# Patient Record
Sex: Male | Born: 1955 | Race: Black or African American | Hispanic: No | Smoking: Never smoker
Health system: Southern US, Community
[De-identification: ages and names within clinical notes are randomized; demographics above are authoritative.]

## PROBLEM LIST (undated history)

## (undated) DIAGNOSIS — I10 Essential (primary) hypertension: Secondary | ICD-10-CM

## (undated) DIAGNOSIS — K219 Gastro-esophageal reflux disease without esophagitis: Secondary | ICD-10-CM

## (undated) DIAGNOSIS — E119 Type 2 diabetes mellitus without complications: Secondary | ICD-10-CM

---

## 2018-07-09 NOTE — Plan of Care (Signed)
Transfer from [facility]. Jerry Green is a 62 year old male with pmh HTN, DM type II, and GERD; who presented with complaints of 3 days of abdominal pain.  Heart rates 104, and all other vital signs stable.  Labs revealed WBC 12.9 and Hbg 12.  Lactic acid and LFTs reportedly within normal limits.  CT abdomen pelvis revealed perforated post obstruction lesion near the splenic flexure with carcinomatosis and liver mets. No previous history of malignancy known. General surgery was consulted, but recommended coordination of care with GI which was not available at their facility.  Dr. Windle Guard of general surgery consulted here at [facility] who agreed to see in consultation.  Patient was started on antibiotics Zosyn and ciprofloxacin.  TRH called to admit.  Accepted to a stepdown bed due to possible concern for decompensation.

## 2018-07-10 ENCOUNTER — Other Ambulatory Visit: Payer: Self-pay

## 2018-07-10 ENCOUNTER — Encounter (HOSPITAL_COMMUNITY): Payer: Self-pay | Admitting: Internal Medicine

## 2018-07-10 ENCOUNTER — Inpatient Hospital Stay (HOSPITAL_COMMUNITY): Payer: Medicaid - Out of State

## 2018-07-10 ENCOUNTER — Inpatient Hospital Stay (HOSPITAL_COMMUNITY)
Admission: AD | Admit: 2018-07-10 | Discharge: 2018-09-02 | DRG: 329 | Disposition: E | Payer: Medicaid - Out of State | Source: Other Acute Inpatient Hospital | Attending: Internal Medicine | Admitting: Internal Medicine

## 2018-07-10 DIAGNOSIS — K219 Gastro-esophageal reflux disease without esophagitis: Secondary | ICD-10-CM | POA: Diagnosis not present

## 2018-07-10 DIAGNOSIS — J80 Acute respiratory distress syndrome: Secondary | ICD-10-CM | POA: Diagnosis not present

## 2018-07-10 DIAGNOSIS — C183 Malignant neoplasm of hepatic flexure: Secondary | ICD-10-CM | POA: Diagnosis not present

## 2018-07-10 DIAGNOSIS — K560 Paralytic ileus: Secondary | ICD-10-CM | POA: Diagnosis not present

## 2018-07-10 DIAGNOSIS — J189 Pneumonia, unspecified organism: Secondary | ICD-10-CM | POA: Diagnosis not present

## 2018-07-10 DIAGNOSIS — R509 Fever, unspecified: Secondary | ICD-10-CM | POA: Diagnosis not present

## 2018-07-10 DIAGNOSIS — C786 Secondary malignant neoplasm of retroperitoneum and peritoneum: Secondary | ICD-10-CM | POA: Diagnosis not present

## 2018-07-10 DIAGNOSIS — D62 Acute posthemorrhagic anemia: Secondary | ICD-10-CM | POA: Diagnosis not present

## 2018-07-10 DIAGNOSIS — E440 Moderate protein-calorie malnutrition: Secondary | ICD-10-CM | POA: Diagnosis not present

## 2018-07-10 DIAGNOSIS — R0682 Tachypnea, not elsewhere classified: Secondary | ICD-10-CM

## 2018-07-10 DIAGNOSIS — C772 Secondary and unspecified malignant neoplasm of intra-abdominal lymph nodes: Secondary | ICD-10-CM | POA: Diagnosis not present

## 2018-07-10 DIAGNOSIS — Y838 Other surgical procedures as the cause of abnormal reaction of the patient, or of later complication, without mention of misadventure at the time of the procedure: Secondary | ICD-10-CM | POA: Diagnosis not present

## 2018-07-10 DIAGNOSIS — J9690 Respiratory failure, unspecified, unspecified whether with hypoxia or hypercapnia: Secondary | ICD-10-CM

## 2018-07-10 DIAGNOSIS — K750 Abscess of liver: Secondary | ICD-10-CM | POA: Diagnosis not present

## 2018-07-10 DIAGNOSIS — F05 Delirium due to known physiological condition: Secondary | ICD-10-CM | POA: Diagnosis not present

## 2018-07-10 DIAGNOSIS — N179 Acute kidney failure, unspecified: Secondary | ICD-10-CM | POA: Diagnosis not present

## 2018-07-10 DIAGNOSIS — Z7189 Other specified counseling: Secondary | ICD-10-CM | POA: Diagnosis not present

## 2018-07-10 DIAGNOSIS — R188 Other ascites: Secondary | ICD-10-CM

## 2018-07-10 DIAGNOSIS — D630 Anemia in neoplastic disease: Secondary | ICD-10-CM | POA: Diagnosis present

## 2018-07-10 DIAGNOSIS — J90 Pleural effusion, not elsewhere classified: Secondary | ICD-10-CM

## 2018-07-10 DIAGNOSIS — D649 Anemia, unspecified: Secondary | ICD-10-CM | POA: Diagnosis not present

## 2018-07-10 DIAGNOSIS — K651 Peritoneal abscess: Secondary | ICD-10-CM | POA: Diagnosis not present

## 2018-07-10 DIAGNOSIS — K56609 Unspecified intestinal obstruction, unspecified as to partial versus complete obstruction: Secondary | ICD-10-CM | POA: Diagnosis present

## 2018-07-10 DIAGNOSIS — K9189 Other postprocedural complications and disorders of digestive system: Secondary | ICD-10-CM | POA: Diagnosis not present

## 2018-07-10 DIAGNOSIS — C778 Secondary and unspecified malignant neoplasm of lymph nodes of multiple regions: Secondary | ICD-10-CM | POA: Diagnosis not present

## 2018-07-10 DIAGNOSIS — E870 Hyperosmolality and hypernatremia: Secondary | ICD-10-CM | POA: Diagnosis not present

## 2018-07-10 DIAGNOSIS — Z933 Colostomy status: Secondary | ICD-10-CM

## 2018-07-10 DIAGNOSIS — N19 Unspecified kidney failure: Secondary | ICD-10-CM | POA: Diagnosis not present

## 2018-07-10 DIAGNOSIS — Z781 Physical restraint status: Secondary | ICD-10-CM

## 2018-07-10 DIAGNOSIS — Z7984 Long term (current) use of oral hypoglycemic drugs: Secondary | ICD-10-CM

## 2018-07-10 DIAGNOSIS — I10 Essential (primary) hypertension: Secondary | ICD-10-CM | POA: Diagnosis not present

## 2018-07-10 DIAGNOSIS — E1165 Type 2 diabetes mellitus with hyperglycemia: Secondary | ICD-10-CM | POA: Diagnosis not present

## 2018-07-10 DIAGNOSIS — C186 Malignant neoplasm of descending colon: Secondary | ICD-10-CM | POA: Diagnosis not present

## 2018-07-10 DIAGNOSIS — C189 Malignant neoplasm of colon, unspecified: Secondary | ICD-10-CM | POA: Diagnosis not present

## 2018-07-10 DIAGNOSIS — Z66 Do not resuscitate: Secondary | ICD-10-CM

## 2018-07-10 DIAGNOSIS — Z978 Presence of other specified devices: Secondary | ICD-10-CM | POA: Diagnosis not present

## 2018-07-10 DIAGNOSIS — C185 Malignant neoplasm of splenic flexure: Secondary | ICD-10-CM | POA: Diagnosis not present

## 2018-07-10 DIAGNOSIS — D473 Essential (hemorrhagic) thrombocythemia: Secondary | ICD-10-CM | POA: Diagnosis not present

## 2018-07-10 DIAGNOSIS — K631 Perforation of intestine (nontraumatic): Secondary | ICD-10-CM | POA: Diagnosis present

## 2018-07-10 DIAGNOSIS — A419 Sepsis, unspecified organism: Secondary | ICD-10-CM | POA: Diagnosis not present

## 2018-07-10 DIAGNOSIS — Z4659 Encounter for fitting and adjustment of other gastrointestinal appliance and device: Secondary | ICD-10-CM

## 2018-07-10 DIAGNOSIS — D72829 Elevated white blood cell count, unspecified: Secondary | ICD-10-CM | POA: Diagnosis not present

## 2018-07-10 DIAGNOSIS — R918 Other nonspecific abnormal finding of lung field: Secondary | ICD-10-CM | POA: Diagnosis not present

## 2018-07-10 DIAGNOSIS — I952 Hypotension due to drugs: Secondary | ICD-10-CM | POA: Diagnosis not present

## 2018-07-10 DIAGNOSIS — J9601 Acute respiratory failure with hypoxia: Secondary | ICD-10-CM | POA: Diagnosis not present

## 2018-07-10 DIAGNOSIS — K760 Fatty (change of) liver, not elsewhere classified: Secondary | ICD-10-CM | POA: Diagnosis present

## 2018-07-10 DIAGNOSIS — J95821 Acute postprocedural respiratory failure: Secondary | ICD-10-CM | POA: Diagnosis not present

## 2018-07-10 DIAGNOSIS — J9622 Acute and chronic respiratory failure with hypercapnia: Secondary | ICD-10-CM | POA: Diagnosis not present

## 2018-07-10 DIAGNOSIS — E878 Other disorders of electrolyte and fluid balance, not elsewhere classified: Secondary | ICD-10-CM | POA: Diagnosis not present

## 2018-07-10 DIAGNOSIS — Y92230 Patient room in hospital as the place of occurrence of the external cause: Secondary | ICD-10-CM | POA: Diagnosis not present

## 2018-07-10 DIAGNOSIS — E876 Hypokalemia: Secondary | ICD-10-CM | POA: Diagnosis not present

## 2018-07-10 DIAGNOSIS — Z79899 Other long term (current) drug therapy: Secondary | ICD-10-CM

## 2018-07-10 DIAGNOSIS — Z515 Encounter for palliative care: Secondary | ICD-10-CM | POA: Diagnosis not present

## 2018-07-10 DIAGNOSIS — K567 Ileus, unspecified: Secondary | ICD-10-CM | POA: Diagnosis not present

## 2018-07-10 DIAGNOSIS — Z886 Allergy status to analgesic agent status: Secondary | ICD-10-CM

## 2018-07-10 DIAGNOSIS — Z6841 Body Mass Index (BMI) 40.0 and over, adult: Secondary | ICD-10-CM

## 2018-07-10 DIAGNOSIS — K6389 Other specified diseases of intestine: Secondary | ICD-10-CM | POA: Diagnosis not present

## 2018-07-10 DIAGNOSIS — C801 Malignant (primary) neoplasm, unspecified: Secondary | ICD-10-CM | POA: Diagnosis not present

## 2018-07-10 DIAGNOSIS — E785 Hyperlipidemia, unspecified: Secondary | ICD-10-CM | POA: Diagnosis present

## 2018-07-10 DIAGNOSIS — T380X5A Adverse effect of glucocorticoids and synthetic analogues, initial encounter: Secondary | ICD-10-CM | POA: Diagnosis not present

## 2018-07-10 DIAGNOSIS — E119 Type 2 diabetes mellitus without complications: Secondary | ICD-10-CM | POA: Diagnosis not present

## 2018-07-10 DIAGNOSIS — J9600 Acute respiratory failure, unspecified whether with hypoxia or hypercapnia: Secondary | ICD-10-CM | POA: Diagnosis not present

## 2018-07-10 DIAGNOSIS — G9341 Metabolic encephalopathy: Secondary | ICD-10-CM | POA: Diagnosis not present

## 2018-07-10 DIAGNOSIS — Z9289 Personal history of other medical treatment: Secondary | ICD-10-CM

## 2018-07-10 DIAGNOSIS — Z833 Family history of diabetes mellitus: Secondary | ICD-10-CM

## 2018-07-10 DIAGNOSIS — I471 Supraventricular tachycardia: Secondary | ICD-10-CM | POA: Diagnosis not present

## 2018-07-10 DIAGNOSIS — Z86010 Personal history of colonic polyps: Secondary | ICD-10-CM

## 2018-07-10 DIAGNOSIS — K56699 Other intestinal obstruction unspecified as to partial versus complete obstruction: Secondary | ICD-10-CM | POA: Diagnosis not present

## 2018-07-10 DIAGNOSIS — T8132XA Disruption of internal operation (surgical) wound, not elsewhere classified, initial encounter: Secondary | ICD-10-CM | POA: Diagnosis not present

## 2018-07-10 DIAGNOSIS — J9621 Acute and chronic respiratory failure with hypoxia: Secondary | ICD-10-CM | POA: Diagnosis not present

## 2018-07-10 DIAGNOSIS — C787 Secondary malignant neoplasm of liver and intrahepatic bile duct: Secondary | ICD-10-CM | POA: Diagnosis not present

## 2018-07-10 DIAGNOSIS — T426X5A Adverse effect of other antiepileptic and sedative-hypnotic drugs, initial encounter: Secondary | ICD-10-CM | POA: Diagnosis not present

## 2018-07-10 DIAGNOSIS — E872 Acidosis: Secondary | ICD-10-CM | POA: Diagnosis not present

## 2018-07-10 DIAGNOSIS — M109 Gout, unspecified: Secondary | ICD-10-CM | POA: Diagnosis present

## 2018-07-10 DIAGNOSIS — C800 Disseminated malignant neoplasm, unspecified: Secondary | ICD-10-CM | POA: Diagnosis not present

## 2018-07-10 DIAGNOSIS — E874 Mixed disorder of acid-base balance: Secondary | ICD-10-CM | POA: Diagnosis not present

## 2018-07-10 DIAGNOSIS — C182 Malignant neoplasm of ascending colon: Secondary | ICD-10-CM | POA: Diagnosis not present

## 2018-07-10 DIAGNOSIS — C799 Secondary malignant neoplasm of unspecified site: Secondary | ICD-10-CM

## 2018-07-10 DIAGNOSIS — K659 Peritonitis, unspecified: Secondary | ICD-10-CM | POA: Diagnosis not present

## 2018-07-10 HISTORY — DX: Gastro-esophageal reflux disease without esophagitis: K21.9

## 2018-07-10 HISTORY — DX: Essential (primary) hypertension: I10

## 2018-07-10 HISTORY — DX: Type 2 diabetes mellitus without complications: E11.9

## 2018-07-10 LAB — GLUCOSE, CAPILLARY
GLUCOSE-CAPILLARY: 119 mg/dL — AB (ref 70–99)
GLUCOSE-CAPILLARY: 101 mg/dL — AB (ref 70–99)
GLUCOSE-CAPILLARY: 99 mg/dL (ref 70–99)
GLUCOSE-CAPILLARY: 119 mg/dL — AB (ref 70–99)

## 2018-07-10 LAB — CBC
HEMATOCRIT: 36.4 % — AB (ref 39.0–52.0)
Hemoglobin: 11.8 g/dL — ABNORMAL LOW (ref 13.0–17.0)
MCH: 28.8 pg (ref 26.0–34.0)
MCHC: 32.4 g/dL (ref 30.0–36.0)
MCV: 88.8 fL (ref 80.0–100.0)
PLATELETS: 459 10*3/uL — AB (ref 150–400)
RBC: 4.1 MIL/uL — ABNORMAL LOW (ref 4.22–5.81)
RDW: 13.2 % (ref 11.5–15.5)
WBC: 11.1 10*3/uL — AB (ref 4.0–10.5)

## 2018-07-10 LAB — COMPREHENSIVE METABOLIC PANEL
ALBUMIN: 3.2 g/dL — AB (ref 3.5–5.0)
ALT: 15 U/L (ref 0–44)
ANION GAP: 9 (ref 5–15)
AST: 24 U/L (ref 15–41)
Alkaline Phosphatase: 94 U/L (ref 38–126)
BUN: 11 mg/dL (ref 8–23)
CO2: 23 mmol/L (ref 22–32)
Calcium: 9 mg/dL (ref 8.9–10.3)
Chloride: 104 mmol/L (ref 98–111)
Creatinine, Ser: 1.06 mg/dL (ref 0.61–1.24)
GFR calc Af Amer: >60 mL/min (ref >60)
GFR calc non Af Amer: >60 mL/min (ref >60)
GLUCOSE: 136 mg/dL — AB (ref 70–99)
POTASSIUM: 4.2 mmol/L (ref 3.5–5.1)
SODIUM: 136 mmol/L (ref 135–145)
Total Bilirubin: 0.9 mg/dL (ref 0.3–1.2)
Total Protein: 7.8 g/dL (ref 6.5–8.1)

## 2018-07-10 LAB — PROTIME-INR
INR: 1.13
Prothrombin Time: 14.4 seconds (ref 11.4–15.2)

## 2018-07-10 LAB — APTT: aPTT: 33 seconds (ref 24–36)

## 2018-07-10 LAB — TYPE AND SCREEN
ABO/RH(D): O POS
Antibody Screen: NEGATIVE

## 2018-07-10 LAB — HEMOGLOBIN A1C
Hgb A1c MFr Bld: 7.5 % — ABNORMAL HIGH (ref 4.8–5.6)
MEAN PLASMA GLUCOSE: 168.55 mg/dL

## 2018-07-10 LAB — HIV ANTIBODY (ROUTINE TESTING W REFLEX): HIV Screen 4th Generation wRfx: NONREACTIVE

## 2018-07-10 LAB — MRSA PCR SCREENING: MRSA by PCR: NEGATIVE

## 2018-07-10 LAB — ABO/RH: ABO/RH(D): O POS

## 2018-07-10 MED ORDER — FENTANYL CITRATE (PF) 100 MCG/2ML IJ SOLN
25.0000 ug | INTRAMUSCULAR | Status: DC | PRN
  Administered 2018-07-10 (×2): 25 ug via INTRAVENOUS
  Filled 2018-07-10 (×2): qty 2

## 2018-07-10 MED ORDER — SODIUM CHLORIDE 0.9 % IV SOLN
INTRAVENOUS | Status: DC
  Administered 2018-07-10: 06:00:00 via INTRAVENOUS

## 2018-07-10 MED ORDER — MIDAZOLAM HCL 2 MG/2ML IJ SOLN
INTRAMUSCULAR | Status: AC
  Filled 2018-07-10: qty 2

## 2018-07-10 MED ORDER — FAMOTIDINE IN NACL 20-0.9 MG/50ML-% IV SOLN
20.0000 mg | Freq: Two times a day (BID) | INTRAVENOUS | Status: DC
  Administered 2018-07-10 – 2018-07-11 (×3): 20 mg via INTRAVENOUS
  Filled 2018-07-10 (×3): qty 50

## 2018-07-10 MED ORDER — PIPERACILLIN-TAZOBACTAM 3.375 G IVPB 30 MIN
3.3750 g | Freq: Once | INTRAVENOUS | Status: AC
  Administered 2018-07-10: 3.375 g via INTRAVENOUS
  Filled 2018-07-10: qty 50

## 2018-07-10 MED ORDER — PROPOFOL 10 MG/ML IV BOLUS
INTRAVENOUS | Status: AC
  Filled 2018-07-10: qty 20

## 2018-07-10 MED ORDER — INSULIN ASPART 100 UNIT/ML ~~LOC~~ SOLN: 0.0000 [IU] | Freq: Every day | SUBCUTANEOUS | Status: DC

## 2018-07-10 MED ORDER — ACETAMINOPHEN 650 MG RE SUPP: 650.0000 mg | Freq: Four times a day (QID) | RECTAL | Status: DC | PRN

## 2018-07-10 MED ORDER — SODIUM CHLORIDE 0.9 % IV SOLN
2.0000 g | INTRAVENOUS | Status: DC
  Filled 2018-07-10: qty 2

## 2018-07-10 MED ORDER — ONDANSETRON HCL 4 MG PO TABS: 4.0000 mg | ORAL_TABLET | Freq: Four times a day (QID) | ORAL | Status: DC | PRN

## 2018-07-10 MED ORDER — FENTANYL CITRATE (PF) 250 MCG/5ML IJ SOLN
INTRAMUSCULAR | Status: AC
  Filled 2018-07-10: qty 5

## 2018-07-10 MED ORDER — HYDRALAZINE HCL 20 MG/ML IJ SOLN: 10.0000 mg | INTRAMUSCULAR | Status: DC | PRN

## 2018-07-10 MED ORDER — INSULIN ASPART 100 UNIT/ML ~~LOC~~ SOLN: 0.0000 [IU] | Freq: Three times a day (TID) | SUBCUTANEOUS | Status: DC

## 2018-07-10 MED ORDER — ENOXAPARIN SODIUM 40 MG/0.4ML ~~LOC~~ SOLN
40.0000 mg | SUBCUTANEOUS | Status: DC
  Filled 2018-07-10: qty 0.4

## 2018-07-10 MED ORDER — PNEUMOCOCCAL VAC POLYVALENT 25 MCG/0.5ML IJ INJ
0.5000 mL | INJECTION | INTRAMUSCULAR | Status: DC
  Filled 2018-07-10: qty 0.5

## 2018-07-10 MED ORDER — HYDRALAZINE HCL 20 MG/ML IJ SOLN: 10.0000 mg | Freq: Four times a day (QID) | INTRAMUSCULAR | Status: DC | PRN

## 2018-07-10 MED ORDER — ACETAMINOPHEN 325 MG PO TABS: 650.0000 mg | ORAL_TABLET | Freq: Four times a day (QID) | ORAL | Status: DC | PRN

## 2018-07-10 MED ORDER — ONDANSETRON HCL 4 MG/2ML IJ SOLN: 4.0000 mg | Freq: Four times a day (QID) | INTRAMUSCULAR | Status: DC | PRN

## 2018-07-10 MED ORDER — ENOXAPARIN SODIUM 30 MG/0.3ML ~~LOC~~ SOLN
30.0000 mg | SUBCUTANEOUS | Status: DC
  Filled 2018-07-10: qty 0.3

## 2018-07-10 MED ORDER — SODIUM CHLORIDE 0.9% FLUSH
3.0000 mL | Freq: Two times a day (BID) | INTRAVENOUS | Status: DC
  Administered 2018-07-10 – 2018-07-11 (×2): 3 mL via INTRAVENOUS

## 2018-07-10 MED ORDER — PIPERACILLIN-TAZOBACTAM 3.375 G IVPB
3.3750 g | Freq: Three times a day (TID) | INTRAVENOUS | Status: DC
  Administered 2018-07-10 – 2018-07-11 (×3): 3.375 g via INTRAVENOUS
  Filled 2018-07-10 (×3): qty 50

## 2018-07-10 NOTE — Progress Notes (Signed)
Initial Nutrition Assessment  DOCUMENTATION CODES:   Obesity unspecified  INTERVENTION:   - Once diet advanced, recommend Ensure Enlive po BID, each supplement provides 350 kcal and 20 grams of protein  NUTRITION DIAGNOSIS:   Inadequate oral intake related to poor appetite, nausea, vomiting as evidenced by per patient/family report, NPO status.  GOAL:   Patient will meet greater than or equal to 90% of their needs  MONITOR:   Diet advancement, I & O's, Labs, Skin  REASON FOR ASSESSMENT:   Malnutrition Screening Tool    ASSESSMENT:   62 year old male who presented with abdominal pain. PMH significant for hypertension, type 2 diabetes mellitus, GERD. 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  Spoke with pt at bedside who was anxiously awaiting surgery. Pt states that he hopes his family will be able to come visit him. Noted plan for surgery later today or tomorrow: L colectomy with colostomy.  Discussed plan with RN.  Pt states that he has felt sluggish and has had a poor appetite since Thursday of last week. Pt states that he has not had anything to eat since he tried to consume chicken noodle soup on Sunday and subsequently vomited. Pt states that this was the last time he had N/V PTA.  Pt states that he typically eats 3 meals daily and drinks water.  Breakfast: cereal with milk Lunch: "I cook something" Dinner: snack  Pt endorses recent weight loss, stating that his PCP told him that he has lost 10 lbs recently. Pt reports his UBW as 275 lbs. Noted weight of 265 lbs on admission. Unsure of timeframe of weight loss.  Medications reviewed and include: Pepcid 20 mg q 12 hours, IV Zosyn  Labs reviewed: hemoglobin A1C 7.5 (H)  NUTRITION - FOCUSED PHYSICAL EXAM:    Most Recent Value  Orbital Region  No depletion  Upper Arm  Region  No depletion  Thoracic and Lumbar Region  No depletion  Buccal Region  No depletion  Temple Region  No depletion  Clavicle Bone Region  No depletion  Clavicle and Acromion Bone Region  No depletion  Scapular Bone Region  No depletion  Dorsal Hand  No depletion  Patellar Region  No depletion  Anterior Thigh Region  No depletion  Posterior Calf Region  No depletion  Edema (RD Assessment)  Moderate [abdomen]  Hair  Reviewed  Eyes  Reviewed  Mouth  Reviewed  Skin  Reviewed  Nails  Reviewed       Diet Order:   Diet Order            Diet NPO time specified  Diet effective now              EDUCATION NEEDS:   No education needs have been identified at this time  Skin:  Skin Assessment: Reviewed RN Assessment  Last BM:  unknown/PTA  Height:   Ht Readings from Last 1 Encounters:  07/04/2018 6' (1.829 m)    Weight:   Wt Readings from Last 1 Encounters:  07/21/2018 120.4 kg    Ideal Body Weight:  80.91 kg  BMI:  Body mass index is 36 kg/m.  Estimated Nutritional Needs:   Kcal:  2000-2200  Protein:  110-125 grams  Fluid:  2.0-2.2 L    Gaynell Face, MS, RD, LDN Inpatient Clinical Dietitian Pager: [phone] Weekend/After Hours: [phone]

## 2018-07-10 NOTE — Consult Note (Signed)
Surgical Consultation Requesting provider: Dr. 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  Transferred here from [facility] in [place], [place], Dr. Sharyne Peach where he presented at approximately 5 PM yesterday with lower abdominal pain for the preceding 2 days, worsening. Aching/ cramping in quality. Associated with 3 episodes of emesis (nonbloody, nonbilious). Prior to Saturday, he denies any abdominal pain, bloating, loss of appetite, or change in bowel movements. Denies melena or hematochezia. His pain currently is mild, but is worst in the right lower quadrant. Nausea is minimal at this time. He reports his last colonoscopy was in 2014; he believes there were a couple polyps but states he was never given the results of any biopsy.  At [facility] ER: His initial vital signs included a blood pressure 123/88, heart rate of 110, respirations 20, temperature 99.3, saturations 96%, weight 129.27 kg.  His abdominal exam was notable for lower abdominal tenderness without any evidence of peritonitis. Workup included urinalysis, blood cultures, EKG, labs and CT scan. White blood cell count 12.9, hemoglobin 12.7, platelets 520, bicarbonate 23, creatinine 1.1, LFTs and lipase unremarkable, albumin 4.0. Urine specific gravity 1.046 concerning for dehydration.  CT scan with IV contrast findings included hepatic keratosis with several cavitated liver lesions the largest measuring 3.1 cmconsistent with metastases, annular obstructing mass in the wall of the splenic flexure of the colon surrounded by spiculated collection of fluid and air measuring 6.1 x 6.9 cm consistent with colon carcinoma with contained perforation; obstruction with distention of the transverse and ascending colon and the entire small bowel, mass appears to invade the lateral aspect of the mid left kidney; trace free peritoneal fluid and nodularity in the abdominal and pelvic  mesenteric fat are consistent with peritoneal carcinomatosis, no pneumoperitoneum. Mildly enlarged mesenteric lymph nodes adjacent to the colon tumor and mildly enlarged retroperitoneal lymph nodes. He was started on Zosyn and Cipro.  Surgeon there, Dr. Costella Hatcher was constituted and recommended transfer out due to CT findings.  Allergies  Allergen Reactions  . Aspirin Nausea Only    Past Medical History:  Diagnosis Date  . Diabetes mellitus type II, controlled ([place])   . GERD (gastroesophageal reflux disease)   . HTN (hypertension)     History reviewed. No pertinent surgical history.  Family History  Problem Relation Age of Onset  . Diabetes Maternal Grandmother     Social History   Socioeconomic History  . Marital status: Divorced    Spouse name: Not on file  . Number of children: Not on file  . Years of education: Not on file  . Highest education level: Not on file  Occupational History  . Not on file  Social Needs  . Financial resource strain: Not on file  . Food insecurity:    Worry: Not on file    Inability: Not on file  . Transportation needs:    Medical: Not on file    Non-medical: Not on file  Tobacco Use  . Smoking status: Never Smoker  . Smokeless tobacco: Never Used  Substance and Sexual Activity  . Alcohol use: Not Currently    Frequency: Never  . Drug use: Never  . Sexual activity: Not on file  Lifestyle  . Physical activity:    Days per week: Not on file    Minutes per session: Not on file  . Stress: Not on file  Relationships  . Social connections:    Talks on phone:  Not on file    Gets together: Not on file    Attends religious service: Not on file    Active member of club or organization: Not on file    Attends meetings of clubs or organizations: Not on file    Relationship status: Not on file  Other Topics Concern  . Not on file  Social History Narrative  . Not on file    No current facility-administered medications on file prior  to encounter.    No current outpatient medications on file prior to encounter.    Review of Systems: a complete, 10pt review of systems was completed with pertinent positives and negatives as documented in the HPI  Physical Exam: Vitals:   07/08/2018 0446  Temp: 98.2 F (36.8 C)   Gen: A&Ox3, no distress  Head: normocephalic, atraumatic Eyes: extraocular motions intact, anicteric.  Neck: supple without mass or thyromegaly Chest: unlabored respirations, symmetrical air entry, clear bilaterally   Cardiovascular: RRR with palpable distal pulses, no pedal edema Abdomen: soft, obese, distended; mildly tender in right lower quadrant without guarding or rebound/ no peritonitis. No mass or organomegaly.  Extremities: warm, without edema, no deformities  Neuro: grossly intact Psych: appropriate mood and affect, normal insight  Skin: warm and dry   Imaging: No results found.    A/P: 62yo man Perforated/obstructing large carcinoma splenic flexure of the colon with lymphatic metastases, ?peritoneal carcinomatosis, liver metastases, and local invasion of the left kidney -CXR and CEA -NG decompression, NPO -Continue empiric abx (zosyn) -IR biopsy liver lesion; endoscopic biopsy of primary lesion not ideal given contained perforation -Will need exploration resection and colostomy this week  Romana Juniper, MD [facility], [place] Pager [phone]

## 2018-07-10 NOTE — Progress Notes (Signed)
PROGRESS NOTE [facility]   Jesten Cappuccio   ZOX:[id number] DOB: [birth year]  DOA: 07/26/2018 PCP: Ollen Bowl, MD   Brief Narrative:  Jerry Green is a 63 year old male with past medical history of T2DM, HTN, and GERD. Patient presented to [facility] in [place], [place] for progressively worsening RLQ abdominal pain, nausea, and vomiting x 3. Patient also reported diminished appetite and recent ten pound weight loss. Patient denied any fever, SOB, cough, melena, hematochezia, or urinary symptoms. CT of abdomen and pelvis revealed a 6.1 x 6.9 obstructive mass in the splenic flexure concerning for carcinoma that has caused proximal bowel distention and a contained perforation. CT also demonstrates concern for liver and lymphatic metastases as well as peritoneal carcinomatosis.  Subjective: Patient continues to experience 8/10 RLQ abdominal pain, diminished appetite, nausea, and fatigue. Denies SOB, CP, fever, chills, vomiting, melena, hematochezia, or urinary symptoms. Last BM was Friday and he notes that it was small, which he attributes to reduced PO intake. Patient states that he is still passing gas, as recently as last night.  Assessment & Plan:   Perforated, obstructive large splenic flexure carcinoma with metastases CT of abdomen and pelvis revealed a 6.1 x 6.9 obstructive mass in the splenic flexure concerning for carcinoma that has caused proximal bowel distention and a contained perforation. CT also demonstrates concern for liver and lymphatic metastases as well as peritoneal carcinomatosis. Vital signs currently stable. Continue Zosyn and Cefotan empirically. BCx pending. Continue Fentanyl IV PRN for pain. General surgery consulted and ordered CXR and CEA, results pending. NG decompression and continue NPO. Will plan biopsy as well as exploratory resection and colostomy for this week.   T2DM Appears to be relatively-controlled with HgbA1C 7.5% and blood sugars 136. Hold  home-medication Metformin. Continue sliding scale insulin, daily CBGs, and hypoglycemic protocols.   Hypertension Blood pressure stable. Continue Hydralazine 10 mg IV q 6 hr PRN if SBP > 170.   GERD Continue scheduled famotidine IV.    DVT prophylaxis: Lovenox Code Status: Full-code Family Communication: None at bedside Disposition Plan: Surgery scheduled for this week, will remain inpatient   Consultants:   General Surgery, Dr. Kae Heller  Procedures:   None  Antimicrobials:  Cefotan, Zosyn   Objective: Vitals:   07/12/2018 0446  Temp: 98.2 F (36.8 C)  TempSrc: Oral  Weight: 120.4 kg  Height: 6' (1.829 m)   No intake or output data in the 24 hours ending 07/20/2018 0731 Filed Weights   07/06/2018 0446  Weight: 120.4 kg    Examination:  General exam: Appears calm and comfortable  HEENT: AC/AT, PERRLA, OP moist and clear Respiratory system: Clear to auscultation. No wheezes, crackles, or rhonchi Cardiovascular system: S1 & S2 heard, RRR. No JVD, murmurs, rubs, or gallops appreciated Gastrointestinal system: Abdomen is distended. Soft, no organomegaly appreciated. Normal bowel sounds heard. Tender to palpation periumbilically and RLQ Central nervous system: Alert and oriented. No focal neurological deficits Extremities: No pedal edema. Symmetric, strength 5/5   Skin: No rashes, lesions, or ulcers Psychiatry: Judgment and insight appear normal. Mood & affect appropriate   Data Reviewed: I have personally reviewed following labs and imaging studies  CBC: Recent Labs  Lab 07/17/2018 0553  WBC 11.1*  HGB 11.8*  HCT 36.4*  MCV 88.8  PLT 811*   Basic Metabolic Panel: No results for input(s): NA, K, CL, CO2, GLUCOSE, BUN, CREATININE, CALCIUM, MG, PHOS in the last 168 hours. GFR: CrCl cannot be calculated (No successful lab value found.). Liver Function Tests:  No results for input(s): AST, ALT, ALKPHOS, BILITOT, PROT, ALBUMIN in the last 168 hours. No results for  input(s): LIPASE, AMYLASE in the last 168 hours. No results for input(s): AMMONIA in the last 168 hours. Coagulation Profile: No results for input(s): INR, PROTIME in the last 168 hours. Cardiac Enzymes: No results for input(s): CKTOTAL, CKMB, CKMBINDEX, TROPONINI in the last 168 hours. BNP (last 3 results) No results for input(s): PROBNP in the last 8760 hours. HbA1C: No results for input(s): HGBA1C in the last 72 hours. CBG: No results for input(s): GLUCAP in the last 168 hours. Lipid Profile: No results for input(s): CHOL, HDL, LDLCALC, TRIG, CHOLHDL, LDLDIRECT in the last 72 hours. Thyroid Function Tests: No results for input(s): TSH, T4TOTAL, FREET4, T3FREE, THYROIDAB in the last 72 hours. Anemia Panel: No results for input(s): VITAMINB12, FOLATE, FERRITIN, TIBC, IRON, RETICCTPCT in the last 72 hours. Sepsis Labs: No results for input(s): PROCALCITON, LATICACIDVEN in the last 168 hours.  No results found for this or any previous visit (from the past 240 hour(s)).    Radiology Studies: No results found.    Scheduled Meds: . enoxaparin (LOVENOX) injection  30 mg Subcutaneous Q24H  . [START ON 07/09/2018] pneumococcal 23 valent vaccine  0.5 mL Intramuscular Tomorrow-1000  . sodium chloride flush  3 mL Intravenous Q12H   Continuous Infusions: . sodium chloride 75 mL/hr at 07/11/2018 0618  . cefoTEtan (CEFOTAN) IV    . famotidine (PEPCID) IV    . piperacillin-tazobactam (ZOSYN)  IV       LOS: 0 days    Time spent: Total of 30 minutes spent with pt, greater than 50% of which was spent in discussion of  treatment, counseling and coordination of care    Krista Blue, PA-S Pager: Text Page via [email or web address]   If 7PM-7AM, please contact night-coverage [email or web address] 08/01/2018, 7:31 AM   Note - This record has been created using [employer]. Chart creation errors have been sought, but may not always have been located. Such creation errors do not reflect on the  standard of medical care.

## 2018-07-10 NOTE — Progress Notes (Signed)
   07/19/2018 1100  Clinical Encounter Type  Visited With Patient  Visit Type Initial  Referral From Nurse  Spiritual Encounters  Spiritual Needs Emotional;Prayer  Stress Factors  Patient Stress Factors Family relationships;Health changes;Loss of control;Major life changes   Responded to spiritual consult. Pt was alert and resting in bed. PT  stated he was very afraid at the news he recently received. He was also concerned about connecting with family for support during his up comming surgery. He was tearful and was thankful for my visit. I offered spiritual support with words of encouragement and prayer. Chaplain available as needed.   Chaplain Fidel Levy [phone]

## 2018-07-10 NOTE — Consult Note (Signed)
[facility] Nurse ostomy consult note [place] Nurse requested for preoperative stoma site marking by Dr. Donne Hazel.  Seen today with Jerry Green, CCS PA.  Mr. Creig Green discussed surgical procedure and stoma creation with patient.  I explaine role of the [facility] nurse team, reassure him that he Jerry have supportive care and teaching following surgery. He has no questions. He states that it is "all a lot to take in" this morning.    Examined patient lying and sitting in order to place the marking in the patient's visual field, away from any creases or abdominal contour issues and within the rectus muscle.  The abdomen is rotund, firm and patient can visualize both marks.  Marked for colostomy/transverse colostomy in the LUQ  7cm to the left of the umbilicus and 14.7WG above the umbilicus.  Marked for ileostomy/transverse colostomy in the RUQ  6 cm to the right of the umbilicus and  95AO above the umbilicus.  Patient's abdomen cleansed with CHG wipes at site markings, allowed to air dry prior to marking.Covered marks with thin film transparent dressing to preserve mark until surgical procedure later today.   [place] Nurse team Jerry follow up with patient after surgery for continue ostomy care and teaching.   Thank you for the opportunity to meet and mark this nice gentleman prior to surgery.  [name], MSN, RN, [place], [facility]  Pager# [phone]

## 2018-07-10 NOTE — Progress Notes (Signed)
    CC: Right lower quadrant pain, nausea,vomiting, weight loss, and anorexia  Subjective: Pt is here by himself, trying to contact daughter.  Fairly comfortable, says he wants to get better.  He understands he needs an operation to remove the obstructed colon mass, and this is just a portion of his cancer seen on CT. He understands we are going to do this later today.  Objective: Vital signs in last 24 hours: Temp:  [98.2 F (36.8 C)] 98.2 F (36.8 C) (10/08 0446) Weight:  [120.4 kg] 120.4 kg (10/08 0446)    Just arrived, no data Afebrile, Mild tachycardia no BP listed WBC 11.1 H/H 11.8/36.4 Platelets 459 CT 07/09/18: There is an annular obstructing mass in the wall of the splenic flexure of the colon surrounded by a spiculated collection of fluid and air measuring 6.1 x 6.9 cm.  Findings are consistent with colon carcinoma with contained perforation.  The obstructing mass results in distention of the transverse and ascending colon and the entire small bowel.  The mass appears to invade the lateral aspect of the mid left kidney.  Hepatic steatosis with interval development of several cavitated liver lesions the largest measuring 3.1 cm most consistent with liver metastasis.  Trace of free peritoneal fluid and nodularity in the abdomen and pelvic mesenteric fat are consistent with peritoneal carcinomatosis.  No pneumoperitoneum, no bone metastasis no acute fractures no dislocations.  Mildly enlarged mesenteric lymph nodes adjacent to the colonic tumor and mildly enlarged retroperitoneal lymph nodes the largest measuring 16 mm. Impression: Findings consistent with perforated obstructing large carcinoma of the splenic flexure of the colon with lymphatic metastasis, peritoneal carcinomatosis, and liver metastasis.   Intake/Output from previous day: No intake/output data recorded. Intake/Output this shift: No intake/output data recorded.  General appearance: alert, cooperative and no  distress Resp: clear to auscultation bilaterally GI: large abdomen, not really tender currently, NO BM for the last 4 days, says BM's were infrequent prior to that  Lab Results:  Recent Labs    08/02/2018 0553  WBC 11.1*  HGB 11.8*  HCT 36.4*  PLT 459*    BMET No results for input(s): NA, K, CL, CO2, GLUCOSE, BUN, CREATININE, CALCIUM in the last 72 hours. PT/INR No results for input(s): LABPROT, INR in the last 72 hours.  No results for input(s): AST, ALT, ALKPHOS, BILITOT, PROT, ALBUMIN in the last 168 hours.   Lipase  No results found for: LIPASE   Prior to Admission medications   Not on File    Medications: . enoxaparin (LOVENOX) injection  30 mg Subcutaneous Q24H  . [START ON 07/31/2018] pneumococcal 23 valent vaccine  0.5 mL Intramuscular Tomorrow-1000  . sodium chloride flush  3 mL Intravenous Q12H   . sodium chloride 75 mL/hr at 07/07/2018 0618  . cefoTEtan (CEFOTAN) IV    . famotidine (PEPCID) IV    . piperacillin-tazobactam (ZOSYN)  IV    Home meds:  Metformin ? Lisinopril ? Protonix   Assessment/Plan  Perforated Colon cancer with peritoneal and liver metastasis by CT Hypertension Diabetes GERD Remote tobacco use  FEN:  NPO/IV fluids DVT:  Lovenox ID:  Zosyn 10/7 =>> day 1 here; Cefotetan pre op Foley:  None Follow up:  Dr. Donne Hazel.    Plan :  He is being marked now for surgery.  Dr. Donne Hazel will talk with him later this AM.  Plan surgery later today.  LOS: 0 days    Yuliya Nova 07/11/2018 [id number]

## 2018-07-10 NOTE — H&P (Addendum)
History and Physical    Jerry Green [id number]:[id number] DOB: 08-13-1956 DOA: 07/29/2018  Referring MD/NP/PA: Roselyn Reef, DO PCP: Ollen Bowl, MD   Patient coming [place], [place] [facility]  Chief Complaint: Abdominal pain  I have personally briefly reviewed patient's old medical records in [place]   HPI: Jerry Green is a 62 y.o. male with medical history significant of HTN, DM type 2, and Gerd; who initially presented to [facility] with complaints of progressively worsening right lower quadrant abdominal pain over the last 3-4 days.  He describes the pain as achy in nature.  Reported associated symptoms of nausea, vomiting x3, increased abdominal gurgling, weight loss of approximately 10 pounds, and decreased appetite.  Denies any blood in emesis.  His last bowel movement occurred 3 days ago was noted to be small.  Pain was a 9 out of 10 at its worst.  Denies having any significant fever, shortness of breath, cough, dysuria, chest pain, or blood in stools/urine.  He reports that he was scheduled to have a colonoscopy on the 20th of this month and his last colonoscopy was performed in 2014.  He reports that he was noted to have polyps during his last colonoscopy, but reports that they lost the polyps.  On admission to [facility] vitals included temperature 99.3 F, heart rates 110, blood pressure 123/88, respirations 20, and O2 saturation 96% on room air.  Labs revealed WBC 12.9, Hbg 12.7, platelets 520, sodium 135, potassium 3.9, chloride 100, CO2 23, BUN 12, creatinine 1.1, glucose 135, and total protein 9.1.  Urinalysis negative for significant signs of infection. Lactic acid and LFTs were within normal limits.  Blood cultures were obtained. 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  No previous history of malignancy  known. General surgery was consulted, but recommended coordination of care with GI which was not available at their facility.  Dr. Windle Guard of general surgery consulted here at [facility] who agreed to see in consultation.  Patient was given 20 mg of Pepcid, Zofran, 1 L of normal saline IV fluids, and started on antibiotics Zosyn and ciprofloxacin.  TRH called to admit and  accepted to a stepdown bed due to possible concern for possible decompensation.   ED Course:  As seen above.  Review of Systems  Constitutional: Positive for weight loss. Negative for chills and fever.  Eyes: Negative for photophobia and pain.  Respiratory: Negative for cough and shortness of breath.   Cardiovascular: Negative for chest pain and leg swelling.  Gastrointestinal: Positive for abdominal pain, constipation, nausea and vomiting.  Genitourinary: Negative for dysuria and frequency.  Musculoskeletal: Negative for falls.  Skin: Negative for itching.  Neurological: Negative for focal weakness and loss of consciousness.  Psychiatric/Behavioral: Negative for substance abuse. The patient is not nervous/anxious.     Past Medical History:  Diagnosis Date  . Diabetes mellitus type II, controlled ([place])   . GERD (gastroesophageal reflux disease)   . HTN (hypertension)     History reviewed. No pertinent surgical history.   reports that he has never smoked. He has never used smokeless tobacco. He reports that he drank alcohol. He reports that he does not use drugs.  Allergies  Allergen Reactions  . Aspirin Nausea Only    Family History  Problem Relation Age of Onset  . Diabetes Maternal Grandmother     Prior to Admission medications   Not  on File    Physical Exam:  Constitutional: Obese in NAD, calm, comfortable Vitals:   08/02/2018 0446  Temp: 98.2 F (36.8 C)  TempSrc: Oral  Weight: 120.4 kg  Height: 6' (1.829 m)   Eyes: PERRL, lids and conjunctivae normal ENMT: Mucous membranes are moist.  Posterior pharynx clear of any exudate or lesions.Normal dentition.  Neck: normal, supple, no masses, no thyromegaly Respiratory: clear to auscultation bilaterally, no wheezing, no crackles. Normal respiratory effort. No accessory muscle use.  Cardiovascular: Regular rate and rhythm, no murmurs / rubs / gallops. No extremity edema. 2+ pedal pulses. No carotid bruits.  Abdomen: Tenderness to palpation of the right lower quadrant.  Fullness of the abdomen felt with palpation.  Bowel sounds are decreased. Musculoskeletal: no clubbing / cyanosis. No joint deformity upper and lower extremities. Good ROM, no contractures. Normal muscle tone.  Skin: no rashes, lesions, ulcers. No induration Neurologic: CN 2-12 grossly intact. Sensation intact, DTR normal. Strength 5/5 in all 4.  Psychiatric: Normal judgment and insight. Alert and oriented x 3. Normal mood.     Labs on Admission: I have personally reviewed following labs and imaging studies  CBC: No results for input(s): WBC, NEUTROABS, HGB, HCT, MCV, PLT in the last 168 hours. Basic Metabolic Panel: No results for input(s): NA, K, CL, CO2, GLUCOSE, BUN, CREATININE, CALCIUM, MG, PHOS in the last 168 hours. GFR: CrCl cannot be calculated (No successful lab value found.). Liver Function Tests: No results for input(s): AST, ALT, ALKPHOS, BILITOT, PROT, ALBUMIN in the last 168 hours. No results for input(s): LIPASE, AMYLASE in the last 168 hours. No results for input(s): AMMONIA in the last 168 hours. Coagulation Profile: No results for input(s): INR, PROTIME in the last 168 hours. Cardiac Enzymes: No results for input(s): CKTOTAL, CKMB, CKMBINDEX, TROPONINI in the last 168 hours. BNP (last 3 results) No results for input(s): PROBNP in the last 8760 hours. HbA1C: No results for input(s): HGBA1C in the last 72 hours. CBG: No results for input(s): GLUCAP in the last 168 hours. Lipid Profile: No results for input(s): CHOL, HDL, LDLCALC, TRIG,  CHOLHDL, LDLDIRECT in the last 72 hours. Thyroid Function Tests: No results for input(s): TSH, T4TOTAL, FREET4, T3FREE, THYROIDAB in the last 72 hours. Anemia Panel: No results for input(s): VITAMINB12, FOLATE, FERRITIN, TIBC, IRON, RETICCTPCT in the last 72 hours. Urine analysis: No results found for: COLORURINE, APPEARANCEUR, LABSPEC, PHURINE, GLUCOSEU, HGBUR, BILIRUBINUR, KETONESUR, PROTEINUR, UROBILINOGEN, NITRITE, LEUKOCYTESUR Sepsis Labs: No results found for this or any previous visit (from the past 240 hour(s)).   Radiological Exams on Admission: No results found.  EKG: Independently reviewed from outside facility.  Sinus rhythm at 94 bpm  Assessment/Plan Bowel obstruction with perforation: Acute.  Patient found to have acute perforation with large obstructing mass of the splenic flexure of the colon with lymphatic metastases peritoneal carcinomatosis and liver mets.  Patient had initially been started on empiric antibiotics of Zosyn and ciprofloxacin.  Dr. Windle Guard of General surgery consulted. - Admit to a stepdown bed - Follow-up with [facility] regarding  blood cultures obtained - N.p.o. - Fentanyl IV as needed pain - Continue empiric antibiotics of Zosyn - Normal saline IV fluids at 75 mL/h - Appreciate general surgery consultative services, will follow-up for further recommendations   Leukocytosis: Acute. WBC elevated at 12.9 at outside facility.  Suspect secondary to above.  - Recheck CBC  Colon mass, peritoneal carcinomatosis, liver metastasis: Acute.  As seen above. - Will need to consult gastroenterology in a.m.  Diabetes mellitus type II: Patient only on oral medications of metformin.  Initial blood sugars noted be within normal limits. - Hypoglycemic protocols - Daily CBGs  Essential hypertension: Stable. - Hydralazine IV as needed  GERD - Pepcid IV  DVT prophylaxis: Lovenox Code Status: Full Family Communication: none  Disposition Plan: TBD  Consults called:  Surgery  Admission status: inpatient  Norval Morton MD [facility] Pager [phone]   If 7PM-7AM, please contact night-coverage [email or web address] Password [email or web address]  07/12/2018, 5:01 AM

## 2018-07-11 ENCOUNTER — Inpatient Hospital Stay (HOSPITAL_COMMUNITY): Payer: Medicaid - Out of State

## 2018-07-11 ENCOUNTER — Encounter (HOSPITAL_COMMUNITY): Admission: AD | Disposition: E | Payer: Self-pay | Source: Other Acute Inpatient Hospital | Attending: Family Medicine

## 2018-07-11 ENCOUNTER — Encounter (HOSPITAL_COMMUNITY): Payer: Self-pay | Admitting: Certified Registered Nurse Anesthetist

## 2018-07-11 HISTORY — PX: APPLICATION OF WOUND VAC: SHX5189

## 2018-07-11 HISTORY — PX: LIVER BIOPSY: SHX301

## 2018-07-11 HISTORY — PX: LAPAROTOMY: SHX154

## 2018-07-11 HISTORY — PX: COLON RESECTION: SHX5231

## 2018-07-11 LAB — CBC
HCT: 37.7 % — ABNORMAL LOW (ref 39.0–52.0)
HEMATOCRIT: 38.2 % — AB (ref 39.0–52.0)
HEMOGLOBIN: 12.1 g/dL — AB (ref 13.0–17.0)
Hemoglobin: 12 g/dL — ABNORMAL LOW (ref 13.0–17.0)
MCH: 27.8 pg (ref 26.0–34.0)
MCH: 28.9 pg (ref 26.0–34.0)
MCHC: 31.4 g/dL (ref 30.0–36.0)
MCHC: 32.1 g/dL (ref 30.0–36.0)
MCV: 88.4 fL (ref 80.0–100.0)
MCV: 90.2 fL (ref 80.0–100.0)
NRBC: 0 % (ref 0.0–0.2)
PLATELETS: 523 10*3/uL — AB (ref 150–400)
Platelets: 517 10*3/uL — ABNORMAL HIGH (ref 150–400)
RBC: 4.32 MIL/uL (ref 4.22–5.81)
RBC: 4.18 MIL/uL — AB (ref 4.22–5.81)
RDW: 13.2 % (ref 11.5–15.5)
RDW: 13.3 % (ref 11.5–15.5)
WBC: 11.4 10*3/uL — ABNORMAL HIGH (ref 4.0–10.5)
WBC: 7 10*3/uL (ref 4.0–10.5)
nRBC: 0 % (ref 0.0–0.2)

## 2018-07-11 LAB — POCT I-STAT 3, ART BLOOD GAS (G3+)
Acid-base deficit: 5 mmol/L — ABNORMAL HIGH (ref 0.0–2.0)
Acid-base deficit: 6 mmol/L — ABNORMAL HIGH (ref 0.0–2.0)
Bicarbonate: 19.8 mmol/L — ABNORMAL LOW (ref 20.0–28.0)
Bicarbonate: 20.5 mmol/L (ref 20.0–28.0)
O2 SAT: 99 %
O2 Saturation: 86 %
PCO2 ART: 37.4 mmHg (ref 32.0–48.0)
PCO2 ART: 45.3 mmHg (ref 32.0–48.0)
PH ART: 7.264 — AB (ref 7.350–7.450)
PH ART: 7.333 — AB (ref 7.350–7.450)
TCO2: 21 mmol/L — AB (ref 22–32)
TCO2: 22 mmol/L (ref 22–32)
pO2, Arterial: 140 mmHg — ABNORMAL HIGH (ref 83.0–108.0)
pO2, Arterial: 59 mmHg — ABNORMAL LOW (ref 83.0–108.0)

## 2018-07-11 LAB — BASIC METABOLIC PANEL
Anion gap: 11 (ref 5–15)
BUN: 12 mg/dL (ref 8–23)
CALCIUM: 9.1 mg/dL (ref 8.9–10.3)
CO2: 23 mmol/L (ref 22–32)
CREATININE: 1.14 mg/dL (ref 0.61–1.24)
Chloride: 100 mmol/L (ref 98–111)
GFR calc Af Amer: >60 mL/min (ref >60)
GLUCOSE: 130 mg/dL — AB (ref 70–99)
Potassium: 4 mmol/L (ref 3.5–5.1)
Sodium: 134 mmol/L — ABNORMAL LOW (ref 135–145)

## 2018-07-11 LAB — RENAL FUNCTION PANEL
ANION GAP: 11 (ref 5–15)
Albumin: 2.9 g/dL — ABNORMAL LOW (ref 3.5–5.0)
BUN: 16 mg/dL (ref 8–23)
CO2: 21 mmol/L — ABNORMAL LOW (ref 22–32)
Calcium: 8.4 mg/dL — ABNORMAL LOW (ref 8.9–10.3)
Chloride: 104 mmol/L (ref 98–111)
Creatinine, Ser: 1.61 mg/dL — ABNORMAL HIGH (ref 0.61–1.24)
GFR, EST AFRICAN AMERICAN: 51 mL/min — AB (ref >60)
GFR, EST NON AFRICAN AMERICAN: 44 mL/min — AB (ref >60)
Glucose, Bld: 208 mg/dL — ABNORMAL HIGH (ref 70–99)
POTASSIUM: 4.6 mmol/L (ref 3.5–5.1)
Phosphorus: 4.3 mg/dL (ref 2.5–4.6)
Sodium: 136 mmol/L (ref 135–145)

## 2018-07-11 LAB — PROTIME-INR
INR: 1.23
Prothrombin Time: 15.4 seconds — ABNORMAL HIGH (ref 11.4–15.2)

## 2018-07-11 LAB — CEA: CEA: 94.4 ng/mL — ABNORMAL HIGH (ref 0.0–4.7)

## 2018-07-11 LAB — GLUCOSE, CAPILLARY
GLUCOSE-CAPILLARY: 128 mg/dL — AB (ref 70–99)
GLUCOSE-CAPILLARY: 191 mg/dL — AB (ref 70–99)
GLUCOSE-CAPILLARY: 194 mg/dL — AB (ref 70–99)

## 2018-07-11 LAB — MAGNESIUM: MAGNESIUM: 1.7 mg/dL (ref 1.7–2.4)

## 2018-07-11 LAB — TRIGLYCERIDES: Triglycerides: 136 mg/dL (ref <150)

## 2018-07-11 SURGERY — LAPAROTOMY, EXPLORATORY
Anesthesia: General | Site: Abdomen

## 2018-07-11 MED ORDER — DEXAMETHASONE SODIUM PHOSPHATE 10 MG/ML IJ SOLN
INTRAMUSCULAR | Status: AC
  Filled 2018-07-11: qty 1

## 2018-07-11 MED ORDER — OXYCODONE HCL 5 MG/5ML PO SOLN: 5.0000 mg | Freq: Once | ORAL | Status: DC | PRN

## 2018-07-11 MED ORDER — MEPERIDINE HCL 50 MG/ML IJ SOLN: 6.2500 mg | INTRAMUSCULAR | Status: DC | PRN

## 2018-07-11 MED ORDER — ROCURONIUM BROMIDE 50 MG/5ML IV SOSY
PREFILLED_SYRINGE | INTRAVENOUS | Status: AC
  Filled 2018-07-11: qty 5

## 2018-07-11 MED ORDER — FAMOTIDINE IN NACL 20-0.9 MG/50ML-% IV SOLN: 20.0000 mg | Freq: Two times a day (BID) | INTRAVENOUS | Status: DC

## 2018-07-11 MED ORDER — FENTANYL 2500MCG IN NS 250ML (10MCG/ML) PREMIX INFUSION: 25.0000 ug/h | INTRAVENOUS | Status: DC

## 2018-07-11 MED ORDER — BUPIVACAINE-EPINEPHRINE (PF) 0.25% -1:200000 IJ SOLN
INTRAMUSCULAR | Status: DC | PRN
  Administered 2018-07-11 (×2): 20 mL

## 2018-07-11 MED ORDER — PROPOFOL 10 MG/ML IV BOLUS
INTRAVENOUS | Status: AC
  Filled 2018-07-11: qty 20

## 2018-07-11 MED ORDER — ACETAMINOPHEN 160 MG/5ML PO SOLN: 325.0000 mg | ORAL | Status: DC | PRN

## 2018-07-11 MED ORDER — HYDRALAZINE HCL 20 MG/ML IJ SOLN: 10.0000 mg | INTRAMUSCULAR | Status: DC | PRN

## 2018-07-11 MED ORDER — FENTANYL CITRATE (PF) 250 MCG/5ML IJ SOLN
INTRAMUSCULAR | Status: AC
  Filled 2018-07-11: qty 5

## 2018-07-11 MED ORDER — ONDANSETRON HCL 4 MG/2ML IJ SOLN
INTRAMUSCULAR | Status: DC | PRN
  Administered 2018-07-11: 4 mg via INTRAVENOUS

## 2018-07-11 MED ORDER — FENTANYL CITRATE (PF) 100 MCG/2ML IJ SOLN
INTRAMUSCULAR | Status: AC
  Administered 2018-07-11: 100 ug via INTRAVENOUS
  Filled 2018-07-11: qty 2

## 2018-07-11 MED ORDER — ENOXAPARIN SODIUM 40 MG/0.4ML ~~LOC~~ SOLN
40.0000 mg | SUBCUTANEOUS | Status: DC
  Filled 2018-07-11: qty 0.4

## 2018-07-11 MED ORDER — PROPOFOL 500 MG/50ML IV EMUL
INTRAVENOUS | Status: DC | PRN
  Administered 2018-07-11: 25 ug/kg/min via INTRAVENOUS

## 2018-07-11 MED ORDER — FENTANYL CITRATE (PF) 100 MCG/2ML IJ SOLN: 25.0000 ug | INTRAMUSCULAR | Status: DC | PRN

## 2018-07-11 MED ORDER — LACTATED RINGERS IV SOLN
INTRAVENOUS | Status: DC | PRN
  Administered 2018-07-11 (×2): via INTRAVENOUS

## 2018-07-11 MED ORDER — ONDANSETRON HCL 4 MG/2ML IJ SOLN: 4.0000 mg | Freq: Once | INTRAMUSCULAR | Status: DC | PRN

## 2018-07-11 MED ORDER — PIPERACILLIN-TAZOBACTAM 3.375 G IVPB
3.3750 g | Freq: Three times a day (TID) | INTRAVENOUS | Status: DC
  Administered 2018-07-11 – 2018-08-04 (×70): 3.375 g via INTRAVENOUS
  Filled 2018-07-11 (×67): qty 50

## 2018-07-11 MED ORDER — OXYCODONE HCL 5 MG PO TABS: 5.0000 mg | ORAL_TABLET | Freq: Once | ORAL | Status: DC | PRN

## 2018-07-11 MED ORDER — PHENYLEPHRINE 40 MCG/ML (10ML) SYRINGE FOR IV PUSH (FOR BLOOD PRESSURE SUPPORT)
PREFILLED_SYRINGE | INTRAVENOUS | Status: DC | PRN
  Administered 2018-07-11 (×2): 80 ug via INTRAVENOUS

## 2018-07-11 MED ORDER — 0.9 % SODIUM CHLORIDE (POUR BTL) OPTIME
TOPICAL | Status: DC | PRN
  Administered 2018-07-11 (×5): 1000 mL

## 2018-07-11 MED ORDER — ORAL CARE MOUTH RINSE
15.0000 mL | OROMUCOSAL | Status: DC
  Administered 2018-07-11 – 2018-07-15 (×38): 15 mL via OROMUCOSAL

## 2018-07-11 MED ORDER — FENTANYL CITRATE (PF) 100 MCG/2ML IJ SOLN
100.0000 ug | INTRAMUSCULAR | Status: AC | PRN
  Administered 2018-07-11 – 2018-07-12 (×3): 100 ug via INTRAVENOUS
  Filled 2018-07-11 (×4): qty 2

## 2018-07-11 MED ORDER — ONDANSETRON 4 MG PO TBDP: 4.0000 mg | ORAL_TABLET | Freq: Four times a day (QID) | ORAL | Status: DC | PRN

## 2018-07-11 MED ORDER — HEMOSTATIC AGENTS (NO CHARGE) OPTIME
TOPICAL | Status: DC | PRN
  Administered 2018-07-11 (×2): 1 via TOPICAL

## 2018-07-11 MED ORDER — FENTANYL CITRATE (PF) 100 MCG/2ML IJ SOLN
100.0000 ug | INTRAMUSCULAR | Status: DC | PRN
  Administered 2018-07-11 – 2018-07-15 (×13): 100 ug via INTRAVENOUS
  Administered 2018-07-15: 25 ug via INTRAVENOUS
  Administered 2018-07-16: 50 ug via INTRAVENOUS
  Filled 2018-07-11 (×15): qty 2

## 2018-07-11 MED ORDER — LIDOCAINE 2% (20 MG/ML) 5 ML SYRINGE
INTRAMUSCULAR | Status: DC | PRN
  Administered 2018-07-11: 80 mg via INTRAVENOUS

## 2018-07-11 MED ORDER — LIDOCAINE 2% (20 MG/ML) 5 ML SYRINGE
INTRAMUSCULAR | Status: AC
  Filled 2018-07-11: qty 5

## 2018-07-11 MED ORDER — MIDAZOLAM HCL 2 MG/2ML IJ SOLN
2.0000 mg | Freq: Once | INTRAMUSCULAR | Status: AC
  Administered 2018-07-11: 2 mg via INTRAVENOUS

## 2018-07-11 MED ORDER — ACETAMINOPHEN 10 MG/ML IV SOLN
1000.0000 mg | Freq: Four times a day (QID) | INTRAVENOUS | Status: AC
  Administered 2018-07-11 – 2018-07-12 (×4): 1000 mg via INTRAVENOUS
  Filled 2018-07-11 (×4): qty 100

## 2018-07-11 MED ORDER — INSULIN ASPART 100 UNIT/ML ~~LOC~~ SOLN
0.0000 [IU] | SUBCUTANEOUS | Status: DC
  Administered 2018-07-11 (×3): 2 [IU] via SUBCUTANEOUS
  Administered 2018-07-12: 1 [IU] via SUBCUTANEOUS
  Administered 2018-07-12: 2 [IU] via SUBCUTANEOUS
  Administered 2018-07-12: 1 [IU] via SUBCUTANEOUS
  Administered 2018-07-12: 2 [IU] via SUBCUTANEOUS
  Administered 2018-07-12 – 2018-07-13 (×2): 1 [IU] via SUBCUTANEOUS
  Administered 2018-07-13: 2 [IU] via SUBCUTANEOUS
  Administered 2018-07-13 (×2): 1 [IU] via SUBCUTANEOUS
  Administered 2018-07-13: 2 [IU] via SUBCUTANEOUS
  Administered 2018-07-13 – 2018-07-16 (×7): 1 [IU] via SUBCUTANEOUS
  Administered 2018-07-17 (×4): 2 [IU] via SUBCUTANEOUS
  Administered 2018-07-17: 1 [IU] via SUBCUTANEOUS
  Administered 2018-07-17 – 2018-07-18 (×3): 2 [IU] via SUBCUTANEOUS

## 2018-07-11 MED ORDER — LACTATED RINGERS IV SOLN
INTRAVENOUS | Status: DC
  Administered 2018-07-11: 09:00:00 via INTRAVENOUS

## 2018-07-11 MED ORDER — KETAMINE HCL 50 MG/5ML IJ SOSY
PREFILLED_SYRINGE | INTRAMUSCULAR | Status: AC
  Filled 2018-07-11: qty 10

## 2018-07-11 MED ORDER — FENTANYL CITRATE (PF) 100 MCG/2ML IJ SOLN
100.0000 ug | Freq: Once | INTRAMUSCULAR | Status: AC
  Administered 2018-07-11: 100 ug via INTRAVENOUS

## 2018-07-11 MED ORDER — ALBUTEROL SULFATE (2.5 MG/3ML) 0.083% IN NEBU: 2.5000 mg | INHALATION_SOLUTION | Freq: Four times a day (QID) | RESPIRATORY_TRACT | Status: DC | PRN

## 2018-07-11 MED ORDER — ACETAMINOPHEN 325 MG PO TABS: 325.0000 mg | ORAL_TABLET | ORAL | Status: DC | PRN

## 2018-07-11 MED ORDER — SODIUM CHLORIDE 0.9 % IV SOLN
INTRAVENOUS | Status: DC
  Administered 2018-07-11 – 2018-07-16 (×6): via INTRAVENOUS

## 2018-07-11 MED ORDER — FAMOTIDINE IN NACL 20-0.9 MG/50ML-% IV SOLN
20.0000 mg | Freq: Two times a day (BID) | INTRAVENOUS | Status: DC
  Administered 2018-07-11 – 2018-07-16 (×11): 20 mg via INTRAVENOUS
  Filled 2018-07-11 (×11): qty 50

## 2018-07-11 MED ORDER — ALBUMIN HUMAN 5 % IV SOLN
INTRAVENOUS | Status: DC | PRN
  Administered 2018-07-11: 12:00:00 via INTRAVENOUS

## 2018-07-11 MED ORDER — ROCURONIUM BROMIDE 50 MG/5ML IV SOSY
PREFILLED_SYRINGE | INTRAVENOUS | Status: AC
  Filled 2018-07-11: qty 10

## 2018-07-11 MED ORDER — METOPROLOL TARTRATE 5 MG/5ML IV SOLN: 5.0000 mg | Freq: Four times a day (QID) | INTRAVENOUS | Status: DC | PRN

## 2018-07-11 MED ORDER — KETAMINE HCL 50 MG/ML IJ SOLN
INTRAMUSCULAR | Status: DC | PRN
  Administered 2018-07-11: 20 mg via INTRAMUSCULAR
  Administered 2018-07-11: 30 mg via INTRAMUSCULAR

## 2018-07-11 MED ORDER — ROCURONIUM BROMIDE 10 MG/ML (PF) SYRINGE
PREFILLED_SYRINGE | INTRAVENOUS | Status: DC | PRN
  Administered 2018-07-11: 20 mg via INTRAVENOUS
  Administered 2018-07-11: 50 mg via INTRAVENOUS
  Administered 2018-07-11: 10 mg via INTRAVENOUS
  Administered 2018-07-11 (×2): 20 mg via INTRAVENOUS
  Administered 2018-07-11: 50 mg via INTRAVENOUS

## 2018-07-11 MED ORDER — MIDAZOLAM HCL 2 MG/2ML IJ SOLN
INTRAMUSCULAR | Status: AC
  Filled 2018-07-11: qty 2

## 2018-07-11 MED ORDER — FENTANYL CITRATE (PF) 100 MCG/2ML IJ SOLN: 50.0000 ug | Freq: Once | INTRAMUSCULAR | Status: DC

## 2018-07-11 MED ORDER — MIDAZOLAM HCL 2 MG/2ML IJ SOLN
INTRAMUSCULAR | Status: AC
  Administered 2018-07-11: 2 mg via INTRAVENOUS
  Filled 2018-07-11: qty 2

## 2018-07-11 MED ORDER — CHLORHEXIDINE GLUCONATE CLOTH 2 % EX PADS
6.0000 | MEDICATED_PAD | Freq: Once | CUTANEOUS | Status: AC
  Administered 2018-07-11: 6 via TOPICAL

## 2018-07-11 MED ORDER — DEXMEDETOMIDINE HCL IN NACL 200 MCG/50ML IV SOLN
INTRAVENOUS | Status: DC | PRN
  Administered 2018-07-11: 4 ug via INTRAVENOUS
  Administered 2018-07-11: 12 ug via INTRAVENOUS
  Administered 2018-07-11 (×2): 8 ug via INTRAVENOUS

## 2018-07-11 MED ORDER — FENTANYL BOLUS VIA INFUSION: 50.0000 ug | INTRAVENOUS | Status: DC | PRN

## 2018-07-11 MED ORDER — PROPOFOL 10 MG/ML IV BOLUS
INTRAVENOUS | Status: DC | PRN
  Administered 2018-07-11: 200 mg via INTRAVENOUS

## 2018-07-11 MED ORDER — SODIUM CHLORIDE 0.9 % IV SOLN
2.0000 g | INTRAVENOUS | Status: AC
  Administered 2018-07-11: 2 g via INTRAVENOUS
  Filled 2018-07-11: qty 2

## 2018-07-11 MED ORDER — CEFOTETAN DISODIUM-DEXTROSE 2-2.08 GM-%(50ML) IV SOLR
INTRAVENOUS | Status: AC
  Filled 2018-07-11: qty 50

## 2018-07-11 MED ORDER — SUCCINYLCHOLINE CHLORIDE 200 MG/10ML IV SOSY
PREFILLED_SYRINGE | INTRAVENOUS | Status: DC | PRN
  Administered 2018-07-11: 200 mg via INTRAVENOUS

## 2018-07-11 MED ORDER — ALBUMIN HUMAN 5 % IV SOLN
INTRAVENOUS | Status: AC
  Filled 2018-07-11: qty 250

## 2018-07-11 MED ORDER — ONDANSETRON HCL 4 MG/2ML IJ SOLN: 4.0000 mg | Freq: Four times a day (QID) | INTRAMUSCULAR | Status: DC | PRN

## 2018-07-11 MED ORDER — HEPARIN SODIUM (PORCINE) 5000 UNIT/ML IJ SOLN
5000.0000 [IU] | Freq: Once | INTRAMUSCULAR | Status: AC
  Administered 2018-07-11: 5000 [IU] via SUBCUTANEOUS
  Filled 2018-07-11: qty 1

## 2018-07-11 MED ORDER — PHENYLEPHRINE 40 MCG/ML (10ML) SYRINGE FOR IV PUSH (FOR BLOOD PRESSURE SUPPORT)
PREFILLED_SYRINGE | INTRAVENOUS | Status: AC
  Filled 2018-07-11: qty 20

## 2018-07-11 MED ORDER — SUCCINYLCHOLINE CHLORIDE 200 MG/10ML IV SOSY
PREFILLED_SYRINGE | INTRAVENOUS | Status: AC
  Filled 2018-07-11: qty 10

## 2018-07-11 MED ORDER — CHLORHEXIDINE GLUCONATE 0.12% ORAL RINSE (MEDLINE KIT)
15.0000 mL | Freq: Two times a day (BID) | OROMUCOSAL | Status: DC
  Administered 2018-07-11 – 2018-07-15 (×8): 15 mL via OROMUCOSAL

## 2018-07-11 MED ORDER — MIDAZOLAM HCL 2 MG/2ML IJ SOLN
INTRAMUSCULAR | Status: DC | PRN
  Administered 2018-07-11: 2 mg via INTRAVENOUS

## 2018-07-11 MED ORDER — PROPOFOL 1000 MG/100ML IV EMUL
0.0000 ug/kg/min | INTRAVENOUS | Status: DC
  Administered 2018-07-11: 30 ug/kg/min via INTRAVENOUS
  Administered 2018-07-11: 27 ug/kg/min via INTRAVENOUS
  Administered 2018-07-12: 15 ug/kg/min via INTRAVENOUS
  Administered 2018-07-12: 20 ug/kg/min via INTRAVENOUS
  Administered 2018-07-12: 30 ug/kg/min via INTRAVENOUS
  Administered 2018-07-13 (×3): 20 ug/kg/min via INTRAVENOUS
  Administered 2018-07-14 (×2): 30 ug/kg/min via INTRAVENOUS
  Administered 2018-07-15 (×3): 35 ug/kg/min via INTRAVENOUS
  Filled 2018-07-11 (×17): qty 100

## 2018-07-11 MED ORDER — FENTANYL CITRATE (PF) 100 MCG/2ML IJ SOLN
INTRAMUSCULAR | Status: DC | PRN
  Administered 2018-07-11 (×5): 50 ug via INTRAVENOUS
  Administered 2018-07-11: 100 ug via INTRAVENOUS

## 2018-07-11 MED ORDER — DEXAMETHASONE SODIUM PHOSPHATE 10 MG/ML IJ SOLN
INTRAMUSCULAR | Status: DC | PRN
  Administered 2018-07-11: 5 mg via INTRAVENOUS

## 2018-07-11 MED ORDER — ALBUMIN HUMAN 5 % IV SOLN
25.0000 g | Freq: Once | INTRAVENOUS | Status: AC
  Administered 2018-07-11: 25 g via INTRAVENOUS

## 2018-07-11 SURGICAL SUPPLY — 74 items
BIOPATCH RED 1 DISK 7.0 (GAUZE/BANDAGES/DRESSINGS) ×2 IMPLANT
BIOPATCH RED 1IN DISK 7.0MM (GAUZE/BANDAGES/DRESSINGS) ×1
BLADE CLIPPER SURG (BLADE) IMPLANT
CANISTER SUCT 3000ML PPV (MISCELLANEOUS) ×3 IMPLANT
CANISTER WOUND CARE 500ML ATS (WOUND CARE) ×3 IMPLANT
CHLORAPREP W/TINT 26ML (MISCELLANEOUS) ×3 IMPLANT
COVER SURGICAL LIGHT HANDLE (MISCELLANEOUS) ×6 IMPLANT
COVER WAND RF STERILE (DRAPES) ×3 IMPLANT
DRAIN CHANNEL 19F RND (DRAIN) ×3 IMPLANT
DRAPE INCISE IOBAN 66X45 STRL (DRAPES) ×3 IMPLANT
DRAPE LAPAROSCOPIC ABDOMINAL (DRAPES) ×3 IMPLANT
DRAPE WARM FLUID 44X44 (DRAPE) ×3 IMPLANT
DRSG OPSITE POSTOP 4X10 (GAUZE/BANDAGES/DRESSINGS) IMPLANT
DRSG OPSITE POSTOP 4X8 (GAUZE/BANDAGES/DRESSINGS) IMPLANT
DRSG TEGADERM 4X4.75 (GAUZE/BANDAGES/DRESSINGS) ×3 IMPLANT
DRSG TELFA 3X8 NADH (GAUZE/BANDAGES/DRESSINGS) ×3 IMPLANT
DRSG VAC ATS LRG SENSATRAC (GAUZE/BANDAGES/DRESSINGS) ×3 IMPLANT
DURAPREP 26ML APPLICATOR (WOUND CARE) ×3 IMPLANT
ELECT BLADE 6.5 EXT (BLADE) ×3 IMPLANT
ELECT CAUTERY BLADE 6.4 (BLADE) ×6 IMPLANT
ELECT REM PT RETURN 9FT ADLT (ELECTROSURGICAL) ×3
ELECTRODE REM PT RTRN 9FT ADLT (ELECTROSURGICAL) ×1 IMPLANT
EVACUATOR SILICONE 100CC (DRAIN) ×3 IMPLANT
GLOVE BIO SURGEON STRL SZ7 (GLOVE) ×12 IMPLANT
GLOVE BIO SURGEON STRL SZ7.5 (GLOVE) ×12 IMPLANT
GLOVE BIOGEL PI IND STRL 7.5 (GLOVE) ×2 IMPLANT
GLOVE BIOGEL PI IND STRL 8 (GLOVE) ×2 IMPLANT
GLOVE BIOGEL PI INDICATOR 7.5 (GLOVE) ×4
GLOVE BIOGEL PI INDICATOR 8 (GLOVE) ×4
GOWN STRL REUS W/ TWL LRG LVL3 (GOWN DISPOSABLE) ×6 IMPLANT
GOWN STRL REUS W/TWL LRG LVL3 (GOWN DISPOSABLE) ×12
HEMOSTAT SURGICEL 2X14 (HEMOSTASIS) ×6 IMPLANT
KIT BASIN OR (CUSTOM PROCEDURE TRAY) ×3 IMPLANT
KIT TURNOVER KIT B (KITS) ×3 IMPLANT
LIGASURE IMPACT 36 18CM CVD LR (INSTRUMENTS) ×3 IMPLANT
LIGHT WAVEGUIDE WIDE FLAT (MISCELLANEOUS) ×3 IMPLANT
NS IRRIG 1000ML POUR BTL (IV SOLUTION) ×15 IMPLANT
PACK COLON (CUSTOM PROCEDURE TRAY) ×3 IMPLANT
PACK GENERAL/GYN (CUSTOM PROCEDURE TRAY) ×3 IMPLANT
PAD ARMBOARD 7.5X6 YLW CONV (MISCELLANEOUS) ×3 IMPLANT
PENCIL BUTTON HOLSTER BLD 10FT (ELECTRODE) ×3 IMPLANT
RELOAD PROXIMATE 75MM BLUE (ENDOMECHANICALS) ×9 IMPLANT
SLEEVE SUCTION CATH 165 (SLEEVE) ×3 IMPLANT
SPECIMEN JAR LARGE (MISCELLANEOUS) IMPLANT
SPONGE LAP 18X18 RF (DISPOSABLE) ×15 IMPLANT
SPONGE LAP 18X18 X RAY DECT (DISPOSABLE) IMPLANT
STAPLER CUT CVD 40MM GREEN (STAPLE) ×3 IMPLANT
STAPLER PROXIMATE 75MM BLUE (STAPLE) ×3 IMPLANT
STAPLER VISISTAT 35W (STAPLE) ×3 IMPLANT
SUCTION POOLE TIP (SUCTIONS) ×3 IMPLANT
SURGILUBE 2OZ TUBE FLIPTOP (MISCELLANEOUS) IMPLANT
SUT ETHILON 2 0 FS 18 (SUTURE) ×3 IMPLANT
SUT NOVA 1 T20/GS 25DT (SUTURE) ×3 IMPLANT
SUT PDS AB 1 TP1 96 (SUTURE) ×15 IMPLANT
SUT PROLENE 2 0 CT2 30 (SUTURE) IMPLANT
SUT PROLENE 2 0 KS (SUTURE) IMPLANT
SUT SILK 2 0 (SUTURE) ×2
SUT SILK 2 0 SH CR/8 (SUTURE) ×3 IMPLANT
SUT SILK 2 0 TIES 10X30 (SUTURE) ×6 IMPLANT
SUT SILK 2-0 18XBRD TIE 12 (SUTURE) ×1 IMPLANT
SUT SILK 3 0 (SUTURE) ×2
SUT SILK 3 0 SH CR/8 (SUTURE) ×3 IMPLANT
SUT SILK 3 0 TIES 10X30 (SUTURE) ×3 IMPLANT
SUT SILK 3-0 18XBRD TIE 12 (SUTURE) ×1 IMPLANT
SUT VIC AB 3-0 SH 18 (SUTURE) IMPLANT
SUT VIC AB 3-0 SH 27 (SUTURE)
SUT VIC AB 3-0 SH 27X BRD (SUTURE) IMPLANT
TOWEL GREEN STERILE (TOWEL DISPOSABLE) ×3 IMPLANT
TRAY FOLEY MTR SLVR 14FR STAT (SET/KITS/TRAYS/PACK) IMPLANT
TRAY FOLEY MTR SLVR 16FR STAT (SET/KITS/TRAYS/PACK) ×3 IMPLANT
TUBE CONNECTING 12'X1/4 (SUCTIONS) ×2
TUBE CONNECTING 12X1/4 (SUCTIONS) ×4 IMPLANT
WND VAC CANISTER 500ML (MISCELLANEOUS) ×3 IMPLANT
YANKAUER SUCT BULB TIP NO VENT (SUCTIONS) IMPLANT

## 2018-07-11 NOTE — Progress Notes (Signed)
Called e-link concerning pt's decreased UO. Roughly 10-59mL/hr. Waiting on orders.

## 2018-07-11 NOTE — Consult Note (Signed)
NAME:  Jerry Green, MRN:  [id number], DOB:  24-Sep-1956, LOS: 1 ADMISSION DATE:  07/31/2018, CONSULTATION DATE:  07/26/2018 REFERRING MD:  Dr. 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  Taken to OR 10/9 for ex lap and liver biopsy s/p colectomy and open abd with wound vac.  Returns to ICU hemodynamically stable to remain on MV overnight.  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 rate 18, height 6' (1.829 m), weight 120.4 kg, SpO2 99 %.        Intake/Output Summary (Last 24 hours) at 07/13/2018 1316 Last data filed at 07/26/2018 1305 Gross per 24 hour  Intake 3027.79 ml  Output 1425 ml  Net 1602.79 ml   Filed Weights   07/21/2018 0446  Weight: 120.4 kg    Examination: General:  Critically ill adult male sedate on MV HEENT: MM pink/moist, ETT 7.5, 23  at lip, R NGT, pupils 4/ equal, DL R IJ site wnl Neuro: sedated, does not f/c, occasionally grimaces  CV: ST 120's, rrr, +2 distal warm extremities PULM: even/non-labored on MV, lungs bilaterally coarse GI: R ostomy- tissue pink, midline abd incision to wound vac, JP to LUQ- with serosang drainage Extremities: warm/dry, no BLE edema  Skin: no rashes   Resolved Hospital Problem list    Assessment & Plan:  Acute respiratory insufficiency in the post-operative setting P:  Continue full MV support to rest overnight, PRVC 8 cc/kg, rate 14 CXR and ABG now Albuterol PRN PAD protocol with propofol and prn fentanyl, RASS goal 0/-1  Bowel obstruction with acute perforation - 2/2 large obstructing mass of the splenic flexure of the colon with lymphatic metastases peritoneal carcinomatosis and liver mets - large output from JP drain since arrival to ICU P:  Post op care- wound vac/ JP drain per CCS Repeat CBC/ renal panel/ coag now to rule out ABLA Scheduled ofirmev to reduce narcs   Colon mass - suspected peritoneal carcinomatosis, liver metastasis, and possible left kidney  P:  Follow liver biopsy results Pending CEA level No need for GI at this point  DMT2 P:  CBG q 4 SSI  Hx HTN, high risk for decompensation/ septic shock given large perforation  - currently hemodynamically stable P:  Monitoring Goal MAP > 65 NS at 75 ml/hr Strict I/O's/ trend UOP PRN only  Apresoline for now  Disposition / Summary of Today's Plan 07/07/2018   Stabilization to ICU     Diet: NPO Pain/Anxiety/Delirium protocol (if indicated): PAD protocol- propofol gtt/ prn fentanyl VAP protocol (if indicated): yes DVT prophylaxis: SCDs, lovenox after 24 hours GI prophylaxis: pepcid Hyperglycemia protocol: cbg q 4/ SSI Mobility: bed rest Code Status: full Family Communication: no family at bedside.   Labs   CBC: Recent Labs  Lab 07/17/2018 0553 07/07/2018 0414  WBC 11.1* 11.4*  HGB 11.8* 12.0*  HCT  36.4* 38.2*  MCV 88.8 88.4  PLT 459* 523*    Basic Metabolic Panel: Recent Labs  Lab 07/22/2018 0701 07/25/2018 0414  NA 136 134*  K 4.2 4.0  CL 104 100  CO2 23 23  GLUCOSE 136* 130*  BUN 11 12  CREATININE 1.06 1.14  CALCIUM 9.0 9.1   GFR: Estimated Creatinine Clearance: 88.8 mL/min (by C-G formula based on SCr of 1.14 mg/dL). Recent Labs  Lab 07/06/2018 0553 07/08/2018 0414  WBC 11.1* 11.4*    Liver Function Tests: Recent Labs  Lab 07/23/2018 0701  AST 24  ALT 15  ALKPHOS 94  BILITOT 0.9  PROT 7.8  ALBUMIN 3.2*   No results for input(s): LIPASE, AMYLASE in the last 168 hours. No results for input(s): AMMONIA in the last 168 hours.  ABG No results found for: PHART, PCO2ART, PO2ART, HCO3, TCO2, ACIDBASEDEF, O2SAT   Coagulation Profile: Recent Labs  Lab 07/26/2018 0701  INR 1.13    Cardiac Enzymes: No results for input(s): CKTOTAL, CKMB, CKMBINDEX, TROPONINI in the last 168 hours.  HbA1C: Hgb A1c MFr Bld  Date/Time Value Ref Range Status  07/28/2018 05:53 AM 7.5 (H) 4.8 - 5.6 % Final    Comment:    (NOTE) Pre diabetes:          5.7%-6.4% Diabetes:              >6.4% Glycemic control for   <7.0% adults with diabetes     CBG: Recent Labs  Lab 07/09/2018 0807 07/31/2018 1325 07/09/2018 1654 07/13/2018 2211 07/11/18 0750  GLUCAP 119* 101* 99 119* 128*    Admitting History of Present Illness.   62 year old male with PMH of HTN, DM, and GERD presenting originally to [facility] in [name] hospital with complaints of progressive RLQ pain for 3-4 days with associated N/ V, decreased appetite, and weight loss of 10 lbs.  Denied fevers, CP, SOB, or bloody urine/stools.  On workup found to have a leukocytosis with normal lactic, normal LFTs with low grade temp but remained hemodynamcially stable.  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  No prior history of malignancy known.  He was started on zosyn and ciprofloxacin and transferred to [facility] on 10/9, as no GI coverage available there which was thought would be needed in coordination with general surgery.  He was taken to OR on 10/9 and found to have perforated splenic flexure colon cancer with resultant small and large bowel obstruction and underwent left colectomy and biopsy of liver sent.  He returns to ICU on mechanical ventilation and therefore PCCM consulted for further ICU medical management.   Review of Systems:   Unable to complete as patient is intubated and sedated on MV.    Past Medical History  He,  has a past medical history of Diabetes mellitus type II,  controlled ([place]), GERD (gastroesophageal reflux disease), and HTN (hypertension).   Surgical History   History reviewed. No pertinent surgical history.   Social History   Social History   Socioeconomic History  . Marital status: Divorced    Spouse name: Not on file  . Number of children: Not on file  . Years of education: Not on file  . Highest education level: Not on file  Occupational History  . Not on file  Social Needs  . Financial resource strain: Not on file  . Food insecurity:    Worry: Not on file    Inability: Not on file  . Transportation needs:    Medical: Not on file    Non-medical: Not on file  Tobacco Use  . Smoking status: Never Smoker  . Smokeless tobacco: Never Used  Substance and Sexual Activity  . Alcohol use: Not Currently    Frequency: Never  . Drug use: Never  . Sexual activity: Not on file  Lifestyle  . Physical activity:    Days per week: Not on file    Minutes per session: Not on file  . Stress: Not on file  Relationships  . Social connections:    Talks on phone: Not on file    Gets together: Not on file    Attends religious service: Not on file    Active member of club or organization: Not on file    Attends meetings  of clubs or organizations: Not on file    Relationship status: Not on file  . Intimate partner violence:    Fear of current or ex partner: Not on file    Emotionally abused: Not on file    Physically abused: Not on file    Forced sexual activity: Not on file  Other Topics Concern  . Not on file  Social History Narrative  . Not on file  ,  reports that he has never smoked. He has never used smokeless tobacco. He reports that he drank alcohol. He reports that he does not use drugs.   Family History   His family history includes Diabetes in his maternal grandmother.   Allergies Allergies  Allergen Reactions  . Aspirin Nausea Only     Home Medications  Prior to Admission medications   Medication Sig Start Date End Date Taking? Authorizing Provider  allopurinol (ZYLOPRIM) 100 MG tablet Take 100 mg by mouth daily. 06/14/18  Yes [provider]  enalapril (VASOTEC) 5 MG tablet Take 5 mg by mouth daily. 06/14/18  Yes [provider]  glimepiride (AMARYL) 2 MG tablet Take 2 mg by mouth 2 (two) times daily. 07/04/18  Yes [provider]  metFORMIN (GLUCOPHAGE) 1000 MG tablet Take 1,000 mg by mouth 2 (two) times daily. 06/14/18  Yes [provider]  pantoprazole (PROTONIX) 40 MG tablet Take 40 mg by mouth daily. 06/14/18  Yes [provider]  simvastatin (ZOCOR) 40 MG tablet Take 40 mg by mouth daily. 06/14/18  Yes [provider]    CCT 35 mins  Kennieth Rad, AGACNP-BC [place] Pgr: [phone] or if no answer [phone] 07/10/2018, 2:13 PM

## 2018-07-11 NOTE — Op Note (Signed)
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  Exploratory laparotomy 2.  Left colectomy 3.  Biopsy of liver lesion Surgeon: Dr. Serita Grammes Assistant: Will Creig Hines Anesthesia: General with tap blocks Estimated blood loss: 250 cc Drains: 19 French Blake drain to left upper quadrant Complications: None Sponge needle count was correct x2 at the end of operation Disposition to ICU in critical condition  Indications: This is 62 year old male was at some time of abdominal pain.  He now has a resultant small and large bowel obstruction for what on CT scan appears to be a stage IV perforated colon cancer and a splenic flexure.  He had an elevated white blood cell count and mild abdominal tenderness.  He was marked for a colostomy preoperatively.  I discussed going to the operating room for a colectomy, colostomy, possible liver biopsy as it appears he has stage IV disease.  Procedure: He first underwent bilateral tap blocks by anesthesia After informed consent he was then taken to the operating room.  He was placed under general anesthesia.  SCDs were in place.  A Foley catheter was placed.  A nasogastric tube was inserted.  He was then intubated and placed under general anesthesia without complication.  He was prepped and draped in the standard sterile surgical fashion.  A surgical timeout was then performed.  I made a very generous midline incision due to his obesity.  I entered into his abdomen and he had a fair amount of fluid present.  I then began by running his small bowel.  His small bowel was dilated throughout but there were no lesions.  There were no peritoneal lesions.  I felt only one liver lesion near the dome of his liver on the right side.  His entire colon up to his splenic flexure was dilated but this was all viable.  The operation was very  difficult due to his morbid obesity.  I inserted a Bookwalter retractor.  I then took down the white line of T[place]t on the left side.  I divided the omentum and remove that from the transverse colon.  As I went towards the splenic flexure the tumor was growing into his retroperitoneum as well as the sidewall.  Due to the fact that he appears to have stage IV disease I elected not to resect all these local structures.  I then with a combination of cautery and blunt dissection remove the tumor from the retroperitoneum.  This had perforated and there was spillage of stool making this a class IV operation.  Eventually I was able to rotate this up medially.  I then divided the colon and the transverse colon using GIA staplers.  I divided the left colon distally at the sigmoid colon.  I did not continue further down due to the fact that he does have stage IV disease and this was a very difficult and I thought that I might injure his ureter down there.  I then passed this off the table and marked it with a stitch proximal.  I then packed this side with sponges.  I then proceeded to confirm NG tube position again.  I then used cautery to remove a portion of the liver lesion and passed this off the table as a specimen.  I bovied this in place Surgicel.  I then irrigated the left upper quadrant copiously.  I placed a 5 [place] Blake drain in there.  I then brought the transverse colon in the right upper quadrant at the prior mark spot through the rectus muscle.  This was somewhat difficult due to the fact that was dilated once I decompressed the colon the cecum was very soft and viable and I did not think any more: Need to be removed at all.  I then proceeded the closed the abdomen with #1 looped PDS with some intermittent #1 Novafil sutures.  I then placed a VAC sponge overlying this.  The JP drain was secured with a 2-0 nylon suture.  I then matured the very dilated: After removing the and with 3-0 Vicryl suture this was all  viable.  I then placed a appliance.  He tolerated this fairly well.  The ICU he was then transferred to the ICU in critical condition.

## 2018-07-11 NOTE — Anesthesia Procedure Notes (Signed)
Anesthesia Regional Block: TAP block   Pre-Anesthetic Checklist: ,, timeout performed, Correct Patient, Correct Site, Correct Laterality, Correct Procedure, Correct Position, site marked, Risks and benefits discussed,  Surgical consent,  Pre-op evaluation,  At surgeon's request and post-op pain management  Laterality: Left and Right  Prep: chloraprep       Needles:  Injection technique: Single-shot  Needle Type: Echogenic Stimulator Needle     Needle Length: 5cm  Needle Gauge: 22     Additional Needles:   Procedures:, nerve stimulator,,, ultrasound used (permanent image in chart),,,,  Narrative:  Start time: 07/15/2018 9:44 AM End time: 07/09/2018 9:55 AM Injection made incrementally with aspirations every 5 mL.  Performed by: Personally  Anesthesiologist: Janeece Riggers, MD  Additional Notes: Functioning IV was confirmed and monitors were applied.  A 6mm 22ga Arrow echogenic stimulator needle was used. Sterile prep and drape,hand hygiene and sterile gloves were used. Ultrasound guidance: relevant anatomy identified, needle position confirmed, local anesthetic spread visualized around nerve(s)., vascular puncture avoided.  Image printed for medical record. Negative aspiration and negative test dose prior to incremental administration of local anesthetic. The patient tolerated the procedure well.

## 2018-07-11 NOTE — Anesthesia Postprocedure Evaluation (Signed)
Anesthesia Post Note  Patient: Jerry Green  Procedure(s) Performed: EXPLORATORY LAPAROTOMY (N/A Abdomen) PARTIAL COLON RESECTION WITH COLOSTOMY (N/A Abdomen) LIVER BIOPSY (N/A Abdomen) APPLICATION OF WOUND VAC (N/A Abdomen)     Patient location during evaluation: SICU Anesthesia Type: General Level of consciousness: sedated Pain management: pain level controlled Vital Signs Assessment: post-procedure vital signs reviewed and stable Respiratory status: patient remains intubated per anesthesia plan Cardiovascular status: stable Postop Assessment: no apparent nausea or vomiting Anesthetic complications: no    Last Vitals:  Vitals:   07/23/2018 1500 08/01/2018 1506  BP: (!) 131/108   Pulse: (!) 120   Resp: (!) 23   Temp: 37.1 C   SpO2: 99% 100%    Last Pain:  Vitals:   07/19/2018 1100  TempSrc:   PainSc: 0-No pain                 Effie Berkshire

## 2018-07-11 NOTE — Anesthesia Preprocedure Evaluation (Addendum)
Anesthesia Evaluation  Patient identified by MRN, date of birth, ID band Patient awake    Reviewed: Allergy & Precautions, H&P , NPO status , Patient's Chart, lab work & pertinent test results, reviewed documented beta blocker date and time   Airway Mallampati: I  TM Distance: >3 FB Neck ROM: full    Dental no notable dental hx. (+) Teeth Intact   Pulmonary neg pulmonary ROS,    Pulmonary exam normal breath sounds clear to auscultation       Cardiovascular Exercise Tolerance: Good hypertension, Pt. on medications  Rhythm:regular Rate:Normal     Neuro/Psych negative neurological ROS  negative psych ROS   GI/Hepatic Neg liver ROS, GERD  ,  Endo/Other  diabetes, Type 2  Renal/GU negative Renal ROS  negative genitourinary   Musculoskeletal negative musculoskeletal ROS (+)   Abdominal   Peds negative pediatric ROS (+)  Hematology negative hematology ROS (+)   Anesthesia Other Findings   Reproductive/Obstetrics negative OB ROS                            Anesthesia Physical Anesthesia Plan  ASA: III and emergent  Anesthesia Plan: General   Post-op Pain Management: GA combined w/ Regional for post-op pain   Induction:   PONV Risk Score and Plan: 2 and Ondansetron, Dexamethasone and Treatment may vary due to age or medical condition  Airway Management Planned: Oral ETT  Additional Equipment:   Intra-op Plan:   Post-operative Plan: Extubation in OR  Informed Consent: I have reviewed the patients History and Physical, chart, labs and discussed the procedure including the risks, benefits and alternatives for the proposed anesthesia with the patient or authorized representative who has indicated his/her understanding and acceptance.   Dental Advisory Given  Plan Discussed with: CRNA, Surgeon and Anesthesiologist  Anesthesia Plan Comments:         Anesthesia Quick  Evaluation

## 2018-07-11 NOTE — Progress Notes (Signed)
eLink Physician-Brief Progress Note Patient Name: Jerry Green DOB: 18-Jul-1956 MRN: [id number]   Date of Service  07/21/2018  HPI/Events of Note  Pt is s/p exploratory laparotomy with colon resection. Low urine output post-operatively.  eICU Interventions  5 % Albumin 500 ml iv fluid bolus x 1        Okoronkwo U Ogan 07/03/2018, 10:30 PM

## 2018-07-11 NOTE — Progress Notes (Signed)
Day of Surgery   Subjective/Chief Complaint: abd pain, Is passing flatus some, no bm   Objective: Vital signs in last 24 hours: Pulse Rate:  [80-93] 80 (10/08 1218) Resp:  [23] 23 (10/08 0737) BP: (137)/(90) 137/90 (10/08 1218) SpO2:  [95 %] 95 % (10/08 1218) Last BM Date: 07/08/18  Intake/Output from previous day: 10/08 0701 - 10/09 0700 In: 777.8 [I.V.:583.5; IV Piggyback:194.3] Out: -  Intake/Output this shift: No intake/output data recorded.  GI: mild tenderness, distended  Lab Results:  Recent Labs    07/09/2018 0553 07/04/2018 0414  WBC 11.1* 11.4*  HGB 11.8* 12.0*  HCT 36.4* 38.2*  PLT 459* 523*   BMET Recent Labs    07/31/2018 0701 07/18/2018 0414  NA 136 134*  K 4.2 4.0  CL 104 100  CO2 23 23  GLUCOSE 136* 130*  BUN 11 12  CREATININE 1.06 1.14  CALCIUM 9.0 9.1   PT/INR Recent Labs    07/07/2018 0701  LABPROT 14.4  INR 1.13   ABG No results for input(s): PHART, HCO3 in the last 72 hours.  Invalid input(s): PCO2, PO2  Studies/Results: Dg Chest 2 View  Result Date: 08/01/2018 CLINICAL DATA:  History of colon cancer abdominal pain EXAM: CHEST - 2 VIEW COMPARISON:  None. FINDINGS: Cardiac shadow is within normal limits. The lungs are well aerated bilaterally. A nodular appearing density is noted over the left mid to lower lung not well appreciated on the lateral projection. This may be related to nipple shadow. Repeat frontal imaging with nipple markers is recommended. No other focal abnormality is noted. IMPRESSION: Question left nipple shadow.  Repeat imaging is recommended. Electronically Signed   By: Inez Catalina M.D.   On: 07/29/2018 10:00    Anti-infectives: Anti-infectives (From admission, onward)   Start     Dose/Rate Route Frequency Ordered Stop   07/19/2018 1200  piperacillin-tazobactam (ZOSYN) IVPB 3.375 g     3.375 g 12.5 mL/hr over 240 Minutes Intravenous Every 8 hours 07/09/2018 0615     07/21/2018 0730  cefoTEtan (CEFOTAN) 2 g in sodium  chloride 0.9 % 100 mL IVPB     2 g 200 mL/hr over 30 Minutes Intravenous On call to O.R. 07/25/2018 0723 07/25/2018 0559   07/29/2018 0615  piperacillin-tazobactam (ZOSYN) IVPB 3.375 g     3.375 g 100 mL/hr over 30 Minutes Intravenous  Once 07/24/2018 0612 07/19/2018 1943      Assessment/Plan: To OR today partial colectomy, colostomy Discussed again possible invasion near spleen, left kidney although unlikely to need to remove it is possible. 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 Discussed risks again which are elevated due to dm and obesity.  His family will be here today and we will proceed Rolm Bookbinder 07/14/2018

## 2018-07-11 NOTE — Progress Notes (Signed)
Day of Surgery    CC: Right lower quadrant pain, nausea,vomiting, weight loss, and anorexia  Subjective: Pt comfortable and says his family is on the way.  They are aware of what is going on now.    Objective: Vital signs in last 24 hours: Temp:  [98.2 F (36.8 C)] 98.2 F (36.8 C) (10/09 0700) Pulse Rate:  [80] 80 (10/08 1218) BP: (137)/(90) 137/90 (10/08 1218) SpO2:  [95 %] 95 % (10/08 1218) Last BM Date: 07/08/18 777 IV Nothing else recorded Afebrile, VSS Labs OK  A1C 7.5  Intake/Output from previous day: 10/08 0701 - 10/09 0700 In: 777.8 [I.V.:583.5; IV Piggyback:194.3] Out: -  Intake/Output this shift: No intake/output data recorded.  General appearance: alert, cooperative and no distress Resp: clear to auscultation bilaterally GI: distended, but not tender.  No pain or discomfort currently  Lab Results:  Recent Labs    07/14/2018 0553 07/31/2018 0414  WBC 11.1* 11.4*  HGB 11.8* 12.0*  HCT 36.4* 38.2*  PLT 459* 523*    BMET Recent Labs    07/30/2018 0701 07/15/2018 0414  NA 136 134*  K 4.2 4.0  CL 104 100  CO2 23 23  GLUCOSE 136* 130*  BUN 11 12  CREATININE 1.06 1.14  CALCIUM 9.0 9.1   PT/INR Recent Labs    07/19/2018 0701  LABPROT 14.4  INR 1.13    Recent Labs  Lab 07/24/2018 0701  AST 24  ALT 15  ALKPHOS 94  BILITOT 0.9  PROT 7.8  ALBUMIN 3.2*     Lipase  No results found for: LIPASE   Medications: . Chlorhexidine Gluconate Cloth  6 each Topical Once  . enoxaparin (LOVENOX) injection  40 mg Subcutaneous Q24H  . insulin aspart  0-5 Units Subcutaneous QHS  . insulin aspart  0-9 Units Subcutaneous TID WC  . pneumococcal 23 valent vaccine  0.5 mL Intramuscular Tomorrow-1000  . 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 Donne Hazel.  PLan:  Surgery later this AM.      LOS: 1 day    Jerry Green 07/19/2018 [phone]

## 2018-07-11 NOTE — Anesthesia Procedure Notes (Signed)
Central Venous Catheter Insertion Performed by: Effie Berkshire, MD, anesthesiologist Start/End10/31/2019 1:07 PM, 07/11/2018 1:15 PM Patient location: Pre-op. Preanesthetic checklist: patient identified, IV checked, site marked, risks and benefits discussed, surgical consent, monitors and equipment checked, pre-op evaluation, timeout performed and anesthesia consent Position: Trendelenburg Lidocaine 1% used for infiltration and patient sedated Hand hygiene performed , maximum sterile barriers used  and Seldinger technique used Catheter size: 8 Fr Total catheter length 16. Central line was placed.Double lumen Procedure performed using ultrasound guided technique. Ultrasound Notes:anatomy identified, needle tip was noted to be adjacent to the nerve/plexus identified, no ultrasound evidence of intravascular and/or intraneural injection and image(s) printed for medical record Attempts: 1 Following insertion, dressing applied, line sutured and Biopatch. Post procedure assessment: blood return through all ports  Patient tolerated the procedure well with no immediate complications.

## 2018-07-11 NOTE — Anesthesia Procedure Notes (Signed)
Procedure Name: Intubation Date/Time: 08/02/2018 10:20 AM Performed by: Candis Shine, CRNA Pre-anesthesia Checklist: Patient identified, Emergency Drugs available, Suction available and Patient being monitored Patient Re-evaluated:Patient Re-evaluated prior to induction Oxygen Delivery Method: Circle System Utilized Preoxygenation: Pre-oxygenation with 100% oxygen Induction Type: IV induction, Cricoid Pressure applied and Rapid sequence Laryngoscope Size: Mac and 4 Grade View: Grade I Tube type: Oral Tube size: 7.5 mm Number of attempts: 1 Airway Equipment and Method: Stylet and Oral airway Placement Confirmation: ETT inserted through vocal cords under direct vision,  positive ETCO2 and breath sounds checked- equal and bilateral Secured at: 23 cm Tube secured with: Tape Dental Injury: Teeth and Oropharynx as per pre-operative assessment  Comments: Placed by Basil Dess

## 2018-07-11 NOTE — Transfer of Care (Signed)
Immediate Anesthesia Transfer of Care Note  Patient: Jerry Green  Procedure(s) Performed: EXPLORATORY LAPAROTOMY (N/A Abdomen) PARTIAL COLON RESECTION WITH COLOSTOMY (N/A Abdomen) LIVER BIOPSY (N/A Abdomen) APPLICATION OF WOUND VAC (N/A Abdomen)  Patient Location: ICU  Anesthesia Type:GA combined with regional for post-op pain  Level of Consciousness: sedated and Patient remains intubated per anesthesia plan  Airway & Oxygen Therapy: Patient remains intubated per anesthesia plan and Patient placed on Ventilator (see vital sign flow sheet for setting)  Post-op Assessment: Report given to RN and Post -op Vital signs reviewed and stable  Post vital signs: Reviewed and stable  Last Vitals:  Vitals Value Taken Time  BP 126/80 07/08/2018  1:35 PM  Temp    Pulse 107 07/16/2018  1:37 PM  Resp 15 07/16/2018  1:37 PM  SpO2 99 % 08/01/2018  1:37 PM  Vitals shown include unvalidated device data.  Last Pain:  Vitals:   07/04/2018 1100  TempSrc:   PainSc: 0-No pain      Patients Stated Pain Goal: 2 (12/81/18 8677)  Complications: No apparent anesthesia complications

## 2018-07-12 ENCOUNTER — Inpatient Hospital Stay (HOSPITAL_COMMUNITY): Payer: Medicaid - Out of State

## 2018-07-12 ENCOUNTER — Encounter (HOSPITAL_COMMUNITY): Payer: Self-pay | Admitting: General Surgery

## 2018-07-12 LAB — RENAL FUNCTION PANEL
ANION GAP: 12 (ref 5–15)
Albumin: 3 g/dL — ABNORMAL LOW (ref 3.5–5.0)
BUN: 24 mg/dL — ABNORMAL HIGH (ref 8–23)
CALCIUM: 8.2 mg/dL — AB (ref 8.9–10.3)
CO2: 22 mmol/L (ref 22–32)
Chloride: 103 mmol/L (ref 98–111)
Creatinine, Ser: 3.42 mg/dL — ABNORMAL HIGH (ref 0.61–1.24)
GFR calc non Af Amer: 18 mL/min — ABNORMAL LOW (ref >60)
GFR, EST AFRICAN AMERICAN: 20 mL/min — AB (ref >60)
GLUCOSE: 179 mg/dL — AB (ref 70–99)
PHOSPHORUS: 4.7 mg/dL — AB (ref 2.5–4.6)
POTASSIUM: 4.9 mmol/L (ref 3.5–5.1)
Sodium: 137 mmol/L (ref 135–145)

## 2018-07-12 LAB — GLUCOSE, CAPILLARY
GLUCOSE-CAPILLARY: 164 mg/dL — AB (ref 70–99)
GLUCOSE-CAPILLARY: 125 mg/dL — AB (ref 70–99)
GLUCOSE-CAPILLARY: 132 mg/dL — AB (ref 70–99)
GLUCOSE-CAPILLARY: 143 mg/dL — AB (ref 70–99)
Glucose-Capillary: 175 mg/dL — ABNORMAL HIGH (ref 70–99)
Glucose-Capillary: 175 mg/dL — ABNORMAL HIGH (ref 70–99)
Glucose-Capillary: 121 mg/dL — ABNORMAL HIGH (ref 70–99)

## 2018-07-12 LAB — CBC
HEMATOCRIT: 34 % — AB (ref 39.0–52.0)
HEMOGLOBIN: 10.6 g/dL — AB (ref 13.0–17.0)
MCH: 27.9 pg (ref 26.0–34.0)
MCHC: 31.2 g/dL (ref 30.0–36.0)
MCV: 89.5 fL (ref 80.0–100.0)
Platelets: 429 10*3/uL — ABNORMAL HIGH (ref 150–400)
RBC: 3.8 MIL/uL — AB (ref 4.22–5.81)
RDW: 13.2 % (ref 11.5–15.5)
WBC: 14.8 10*3/uL — ABNORMAL HIGH (ref 4.0–10.5)
nRBC: 0 % (ref 0.0–0.2)

## 2018-07-12 LAB — SODIUM, URINE, RANDOM

## 2018-07-12 LAB — CREATININE, URINE, RANDOM: CREATININE, URINE: 187.12 mg/dL

## 2018-07-12 LAB — PROCALCITONIN: Procalcitonin: 59.4 ng/mL

## 2018-07-12 MED ORDER — ENOXAPARIN SODIUM 40 MG/0.4ML ~~LOC~~ SOLN
40.0000 mg | SUBCUTANEOUS | Status: DC
  Administered 2018-07-12 – 2018-07-16 (×5): 40 mg via SUBCUTANEOUS
  Filled 2018-07-12 (×5): qty 0.4

## 2018-07-12 MED ORDER — SODIUM CHLORIDE 0.9 % IV BOLUS
1000.0000 mL | Freq: Once | INTRAVENOUS | Status: AC
  Administered 2018-07-12: 1000 mL via INTRAVENOUS

## 2018-07-12 MED ORDER — ALBUMIN HUMAN 5 % IV SOLN
25.0000 g | Freq: Once | INTRAVENOUS | Status: AC
  Administered 2018-07-12: 12.5 g via INTRAVENOUS
  Filled 2018-07-12: qty 250

## 2018-07-12 NOTE — Progress Notes (Signed)
1 Day Post-Op    CC:  Subjective: Sedated on the VENT.  Mittens on.  NG sump fixed, NG is working wound vac in place.    Objective: Vital signs in last 24 hours: Temp:  [97.7 F (36.5 C)-100.4 F (38 C)] 100.4 F (38 C) (10/10 0801) Pulse Rate:  [102-123] 108 (10/10 0800) Resp:  [14-27] 26 (10/10 0800) BP: (74-163)/(55-123) 94/78 (10/10 0800) SpO2:  [98 %-100 %] 99 % (10/10 0800) FiO2 (%):  [40 %] 40 % (10/10 0431) Last BM Date: 07/19/2018 4119 IV 275 urine recorded NG 300 Drain 70 Other?  1000 Colostomy 10 BP up early AM and down 0700 94/78 Temp: 100.4 at 0800 Creatinine 1.14 >> 3.42 this AM H/H 11.8/36.4 >> 14.8  WBC 14.8 INR 1.23 Intake/Output from previous day: 10/09 0701 - 10/10 0700 In: 4119.7 [I.V.:2626.5; IV Piggyback:1213.1] Out: 1956 [Urine:275; Emesis/NG output:300; Drains:70; Stool:10; Blood:300] Intake/Output this shift: Total I/O In: 40.8 [I.V.:40.8] Out: 40 [Urine:30; Drains:10]  General appearance: sedated on the vent, but wakes up.  In Mittens. Resp: clear to auscultation bilaterally and anterior exam GI: NO BS, some bloody drainage in ostomy, no stool.  wound vac in place, Drainage from JP is more serous this AM  Lab Results:  Recent Labs    07/08/2018 1402 07/12/18 0340  WBC 7.0 14.8*  HGB 12.1* 10.6*  HCT 37.7* 34.0*  PLT 517* 429*    BMET Recent Labs    07/09/2018 1402 07/12/18 0340  NA 136 137  K 4.6 4.9  CL 104 103  CO2 21* 22  GLUCOSE 208* 179*  BUN 16 24*  CREATININE 1.61* 3.42*  CALCIUM 8.4* 8.2*   PT/INR Recent Labs    07/13/2018 0701 08/02/2018 1402  LABPROT 14.4 15.4*  INR 1.13 1.23    Recent Labs  Lab 07/07/2018 0701 07/13/2018 1402 07/12/18 0340  AST 24  --   --   ALT 15  --   --   ALKPHOS 94  --   --   BILITOT 0.9  --   --   PROT 7.8  --   --   ALBUMIN 3.2* 2.9* 3.0*     Lipase  No results found for: LIPASE   Medications: . chlorhexidine gluconate (MEDLINE KIT)  15 mL Mouth Rinse BID  . enoxaparin  (LOVENOX) injection  40 mg Subcutaneous Q24H  . insulin aspart  0-9 Units Subcutaneous Q4H  . mouth rinse  15 mL Mouth Rinse 10 times per day   . sodium chloride Stopped (07/04/2018 1708)  . famotidine (PEPCID) IV 20 mg (07/16/2018 2156)  . piperacillin-tazobactam (ZOSYN)  IV 3.375 g (07/12/18 0101)  . propofol (DIPRIVAN) infusion 15 mcg/kg/min (07/12/18 0801)   Anti-infectives (From admission, onward)   Start     Dose/Rate Route Frequency Ordered Stop   07/04/2018 1800  piperacillin-tazobactam (ZOSYN) IVPB 3.375 g     3.375 g 12.5 mL/hr over 240 Minutes Intravenous Every 8 hours 07/24/2018 1444     07/31/2018 0915  cefoTEtan (CEFOTAN) 2 g in sodium chloride 0.9 % 100 mL IVPB     2 g 200 mL/hr over 30 Minutes Intravenous To Short Stay 07/26/2018 0857 07/13/2018 1900   07/30/2018 0906  cefoTEtan in Dextrose 5% (CEFOTAN) 2-2.08 GM-%(50ML) IVPB    Note to Pharmacy:  Jerry Green   : cabinet override      07/06/2018 0906 07/24/2018 2114   07/05/2018 1200  piperacillin-tazobactam (ZOSYN) IVPB 3.375 g  Status:  Discontinued     3.375  g 12.5 mL/hr over 240 Minutes Intravenous Every 8 hours 07/29/2018 0615 07/24/2018 1341   07/24/2018 0730  cefoTEtan (CEFOTAN) 2 g in sodium chloride 0.9 % 100 mL IVPB  Status:  Discontinued     2 g 200 mL/hr over 30 Minutes Intravenous On call to O.R. 07/08/2018 0723 07/04/2018 0559   07/15/2018 0615  piperacillin-tazobactam (ZOSYN) IVPB 3.375 g     3.375 g 100 mL/hr over 30 Minutes Intravenous  Once 07/28/2018 0612 07/15/2018 1943      Assessment/Plan Acute respiratory failure - on Vent Acute kidney dz  Creatinine 1.14 >> 3.42 Hypertension Diabetes GERD Remote tobacco use BMI 36  Perforated splenic flexure colon cancer with large and small bowel obstruction Probable peritoneal and liver metatasis Exploratory laparotomy, left colectomy and liver biopsy, 07/05/2018, Dr. Rolm Bookbinder POD#1  FEN:  NPO/ IV fluids:  NS at 75 cc/hr ID:  Zosyn 10/8 =>> day 3;  Cefotetan pre op DVT:   SCD/Lovenox to restart later today Medicine: Dr. Evalee Mutton Ghimire/CCM:  Dr. Darlys Gales Foley:  In Follow up:  Dr. Donne Hazel    Not much drainage from the JP, bloody drainage from the ostomy.  We will plan to do Vac change MWF. Urine output 30 ml last hours, will defer to CCM.  Still on Vent but is waking up during exam.  NG working but not much drainage.   LOS: 2 days    Jerry Green 07/12/2018 [phone]

## 2018-07-12 NOTE — Progress Notes (Signed)
RT note- attempted wean, high VE, placed back to full support.

## 2018-07-12 NOTE — Consult Note (Signed)
[name] Nurse wound consult note: Patient with new right upper quadrant transverse colostomy and midline wound with NPWT in place.  [name] Nurse ostomy consult note Stoma type/location: RUQ transverse colostomy Stomal assessment/size: viewed through intact pouch, approximately 1 and 3/4 inch red, round, moist, slightly above skin level, os at center Peristomal assessment: not seen today Treatment options for stomal/peristomal skin: Not seen today Output: scant bloody discharge, no flatus, no effluent Ostomy pouching: 2pc. pouch in tact from surgery Education provided: none Enrolled patient in [place] program: No   [facility] Nurse wound consult note Reason for Consult: Midline wound with NPWT dressing placed intraoperatively Wednesday, 10/9 Wound type:Surgical Pressure Injury POA: NA Measurement: Approximately 22cm in length and 3cm wide.  Jerry see in am for first surgical dressing (and pouch) change with CS PA, Jerry Green. Wound bed:NOt seen today Drainage (amount, consistency, odor) Scant serosanguinous in tubing Periwound:Not seen today. Dressing procedure/placement/frequency:First surgical dressing change in am.    [place] nursing team Jerry follow along with you for ostomy care and teaching and NPWT dressing changes on M/W/F (at least initially), and Jerry remain available to this patient, the nursing, srugical and medical teams.    Thank you for involving [place] in the care of this nice gentleman. [name], MSN, RN, [place], Jerry Green  Pager# [phone]

## 2018-07-12 NOTE — Progress Notes (Signed)
PULMONARY / CRITICAL CARE MEDICINE   NAME:  Jerry Green, MRN:  [id number], DOB:  05-Sep-1956, LOS: 2 ADMISSION DATE:  07/17/2018, CONSULTATION DATE:  08/02/2018 REFERRING MD:  Jeanmarie Hubert, CHIEF COMPLAINT:  Abdominal pain  BRIEF HISTORY:       62 year old diabetic day 1 status post exploratory laparotomy with finding of perforated sigmoid colon secondary to an obstructing carcinoma HISTORY OF PRESENT ILLNESS      62 yoM with no prior known hx of malignancy transferred from OSH with 2-3 day hx of abd pain, N/V, found to have 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  Taken to OR 10/9 for ex lap and liver biopsy s/p colectomy and open abd with wound vac. SIGNIFICANT PAST MEDICAL HISTORY   DM  SIGNIFICANT EVENTS:  Laparotomy 10/9 STUDIES:    CULTURES:    ANTIBIOTICS:  Zosyn  LINES/TUBES:   Right IJ placed 10/9 CONSULTANTS:   SUBJECTIVE:  Oliguric overnight. Still sedated and mechanically ventilated. No hemodynamic instability, no pressors  CONSTITUTIONAL: BP 94/78 (BP Location: Left Arm)   Pulse (!) 108   Temp (!) 100.4 F (38 C) (Oral)   Resp (!) 26   Ht 6' (1.829 m)   Wt 120.4 kg   SpO2 99%   BMI 36.00 kg/m   I/O last 3 completed shifts: In: 4525.4 [I.V.:2909.9; Other:280; IV Piggyback:1335.4] Out: 1956 [Urine:275; Emesis/NG output:300; Drains:70; Other:1000; Stool:10; Blood:300]     Vent Mode: PRVC FiO2 (%):  [40 %] 40 % Set Rate:  [14 bmp] 14 bmp Vt Set:  [620 mL] 620 mL PEEP:  [5 cmH20] 5 cmH20 Plateau Pressure:  [17 GUR42-70 cmH20] 19 cmH20  PHYSICAL EXAM: General: Middle-aged male orally intubated, sedated, in no obvious distress. Neuro: She will eye opening to voice and in all fours Cardiovascular: S1 and S2 are regular without murmur rub or gallop.  There is no dependent edema. Lungs: Respirations are  unlabored on a pressure support of 12.  There is symmetric air movement, no wheezes a few scattered rhonchi. Abdomen: The abdomen is obese and soft without any overt organomegaly masses or tenderness.  The ostomy mucosa is pink, there is minimal output from the JP drain Musculoskeletal:   Skin:    RESOLVED PROBLEM LIST   ASSESSMENT AND PLAN   62 year old diabetic status post laparotomy for abdominal pain with finding of a perforated sigmoid flexure secondary to a apparent primary mass with metastases. He has been oliguric overnight and I am fluid loading the patient this morning.  Continues empiric Zosyn for peritoneal soiling.  No plan to progress rapidly towards wean until we see how he responds to his substantial fluid boluses.  Diabetes is being controlled with sliding scale insulin  SUMMARY OF TODAY'S PLAN:   Best Practice / Goals of Care / Disposition.   DVT PROPHYLAXIS: Lovenox which may require adjustment should his renal function decline further SUP: Pepcid which also may require adjustment NUTRITION: MOBILITY:  LABS  Glucose Recent Labs  Lab 07/29/2018 2211 07/06/2018 0750 07/27/2018 1556 07/08/2018 2344 07/12/18 0452 07/12/18 0751  GLUCAP 119* 128* 191* 194* 175* 164*    BMET Recent Labs  Lab 07/15/2018 0414 07/10/2018 1402 07/12/18 0340  NA 134* 136 137  K 4.0 4.6 4.9  CL 100 104 103  CO2 23 21* 22  BUN 12 16 24*  CREATININE 1.14 1.61* 3.42*  GLUCOSE 130* 208* 179*  Liver Enzymes Recent Labs  Lab 07/19/2018 0701 08/02/2018 1402 07/12/18 0340  AST 24  --   --   ALT 15  --   --   ALKPHOS 94  --   --   BILITOT 0.9  --   --   ALBUMIN 3.2* 2.9* 3.0*    Electrolytes Recent Labs  Lab 07/13/2018 0414 07/16/2018 1402 07/12/18 0340  CALCIUM 9.1 8.4* 8.2*  MG  --  1.7  --   PHOS  --  4.3 4.7*    CBC Recent Labs  Lab 07/26/2018 0414 08/02/2018 1402 07/12/18 0340  WBC 11.4* 7.0 14.8*  HGB 12.0* 12.1* 10.6*  HCT 38.2* 37.7* 34.0*  PLT 523* 517* 429*     ABG Recent Labs  Lab 08/02/2018 1507 07/18/2018 1516  PHART 7.264* 7.333*  PCO2ART 45.3 37.4  PO2ART 59.0* 140.0*    Coag's Recent Labs  Lab 07/23/2018 0701 07/22/2018 0748 07/03/2018 1402  APTT  --  33  --   INR 1.13  --  1.23    Sepsis Markers No results for input(s): LATICACIDVEN, PROCALCITON, O2SATVEN in the last 168 hours.  Cardiac Enzymes No results for input(s): TROPONINI, PROBNP in the last 168 hours.  PAST MEDICAL HISTORY :   He  has a past medical history of Diabetes mellitus type II, controlled ([place]), GERD (gastroesophageal reflux disease), and HTN (hypertension).  PAST SURGICAL HISTORY:  He  has no past surgical history on file.  Allergies  Allergen Reactions  . Aspirin Nausea Only    No current facility-administered medications on file prior to encounter.    Current Outpatient Medications on File Prior to Encounter  Medication Sig  . allopurinol (ZYLOPRIM) 100 MG tablet Take 100 mg by mouth daily.  . enalapril (VASOTEC) 5 MG tablet Take 5 mg by mouth daily.  Marland Kitchen glimepiride (AMARYL) 2 MG tablet Take 2 mg by mouth 2 (two) times daily.  . metFORMIN (GLUCOPHAGE) 1000 MG tablet Take 1,000 mg by mouth 2 (two) times daily.  . pantoprazole (PROTONIX) 40 MG tablet Take 40 mg by mouth daily.  . simvastatin (ZOCOR) 40 MG tablet Take 40 mg by mouth daily.    FAMILY HISTORY:   His family history includes Diabetes in his maternal grandmother.  SOCIAL HISTORY:  He  reports that he has never smoked. He has never used smokeless tobacco. He reports that he drank alcohol. He reports that he does not use drugs.  REVIEW OF SYSTEMS:    Unobtainable  Greater than 32 minutes was spent in the care of this patient today he continues to require mechanical ventilatory support and is suffering from an acute renal insult.    Lars Masson, MD

## 2018-07-13 LAB — COMPREHENSIVE METABOLIC PANEL
ALBUMIN: 2.5 g/dL — AB (ref 3.5–5.0)
ALT: 50 U/L — ABNORMAL HIGH (ref 0–44)
ANION GAP: 10 (ref 5–15)
AST: 49 U/L — AB (ref 15–41)
Alkaline Phosphatase: 50 U/L (ref 38–126)
BILIRUBIN TOTAL: 1.1 mg/dL (ref 0.3–1.2)
BUN: 28 mg/dL — AB (ref 8–23)
CO2: 23 mmol/L (ref 22–32)
Calcium: 8.1 mg/dL — ABNORMAL LOW (ref 8.9–10.3)
Chloride: 107 mmol/L (ref 98–111)
Creatinine, Ser: 2.61 mg/dL — ABNORMAL HIGH (ref 0.61–1.24)
GFR calc Af Amer: 28 mL/min — ABNORMAL LOW (ref >60)
GFR, EST NON AFRICAN AMERICAN: 24 mL/min — AB (ref >60)
GLUCOSE: 145 mg/dL — AB (ref 70–99)
POTASSIUM: 3.8 mmol/L (ref 3.5–5.1)
Sodium: 140 mmol/L (ref 135–145)
TOTAL PROTEIN: 6.3 g/dL — AB (ref 6.5–8.1)

## 2018-07-13 LAB — BLOOD GAS, ARTERIAL
Acid-base deficit: 2 mmol/L (ref 0.0–2.0)
Bicarbonate: 22 mmol/L (ref 20.0–28.0)
Drawn by: 41422
FIO2: 40
MECHVT: 620 mL
O2 Saturation: 98.5 %
PATIENT TEMPERATURE: 98.6
PCO2 ART: 35.3 mmHg (ref 32.0–48.0)
PEEP: 5 cmH2O
PH ART: 7.41 (ref 7.350–7.450)
PO2 ART: 133 mmHg — AB (ref 83.0–108.0)
RATE: 14 resp/min

## 2018-07-13 LAB — GLUCOSE, CAPILLARY
GLUCOSE-CAPILLARY: 132 mg/dL — AB (ref 70–99)
GLUCOSE-CAPILLARY: 122 mg/dL — AB (ref 70–99)
Glucose-Capillary: 124 mg/dL — ABNORMAL HIGH (ref 70–99)
Glucose-Capillary: 139 mg/dL — ABNORMAL HIGH (ref 70–99)
Glucose-Capillary: 149 mg/dL — ABNORMAL HIGH (ref 70–99)
Glucose-Capillary: 151 mg/dL — ABNORMAL HIGH (ref 70–99)

## 2018-07-13 LAB — CBC WITH DIFFERENTIAL/PLATELET
Abs Immature Granulocytes: 0.24 10*3/uL — ABNORMAL HIGH (ref 0.00–0.07)
BASOS PCT: 0 %
Basophils Absolute: 0 10*3/uL (ref 0.0–0.1)
EOS ABS: 0.1 10*3/uL (ref 0.0–0.5)
EOS PCT: 1 %
HEMATOCRIT: 29.4 % — AB (ref 39.0–52.0)
HEMOGLOBIN: 9.2 g/dL — AB (ref 13.0–17.0)
Immature Granulocytes: 2 %
Lymphocytes Relative: 7 %
Lymphs Abs: 1.2 10*3/uL (ref 0.7–4.0)
MCH: 27.7 pg (ref 26.0–34.0)
MCHC: 31.3 g/dL (ref 30.0–36.0)
MCV: 88.6 fL (ref 80.0–100.0)
MONO ABS: 1.1 10*3/uL — AB (ref 0.1–1.0)
MONOS PCT: 7 %
NEUTROS PCT: 83 %
Neutro Abs: 13.7 10*3/uL — ABNORMAL HIGH (ref 1.7–7.7)
Platelets: 359 10*3/uL (ref 150–400)
RBC: 3.32 MIL/uL — ABNORMAL LOW (ref 4.22–5.81)
RDW: 13.5 % (ref 11.5–15.5)
WBC: 16.3 10*3/uL — ABNORMAL HIGH (ref 4.0–10.5)
nRBC: 0 % (ref 0.0–0.2)

## 2018-07-13 LAB — PREALBUMIN: PREALBUMIN: 13.2 mg/dL — AB (ref 18–38)

## 2018-07-13 LAB — PROCALCITONIN: PROCALCITONIN: 25.62 ng/mL

## 2018-07-13 MED ORDER — VITAL HIGH PROTEIN PO LIQD
1000.0000 mL | ORAL | Status: DC
  Administered 2018-07-13: 1000 mL

## 2018-07-13 NOTE — Progress Notes (Signed)
2 Days Post-Op   Subjective/Chief Complaint: Intubated, sedated   Objective: Vital signs in last 24 hours: Temp:  [98.6 F (37 C)-100.4 F (38 C)] 99.3 F (37.4 C) (10/11 0400) Pulse Rate:  [103-127] 119 (10/11 0600) Resp:  [20-28] 21 (10/11 0600) BP: (94-129)/(78-90) 113/82 (10/11 0600) SpO2:  [98 %-100 %] 99 % (10/11 0600) FiO2 (%):  [40 %] 40 % (10/11 0400) Last BM Date: 07/12/18  Intake/Output from previous day: 10/10 0701 - 10/11 0700 In: 3468.9 [I.V.:1629.8; IV Piggyback:1839.1] Out: 7322 [Urine:1340; Drains:115; Stool:10] Intake/Output this shift: No intake/output data recorded.  Resp: coarse bilaterally Cardio: regular rate and rhythm GI: vac in place, few bs, stoma functional, approp tender, jp serous  Lab Results:  Recent Labs    07/12/18 0340 07/13/18 0440  WBC 14.8* PENDING  HGB 10.6* 9.2*  HCT 34.0* 29.4*  PLT 429* 359   BMET Recent Labs    07/12/18 0340 07/13/18 0440  NA 137 140  K 4.9 3.8  CL 103 107  CO2 22 23  GLUCOSE 179* 145*  BUN 24* 28*  CREATININE 3.42* 2.61*  CALCIUM 8.2* 8.1*   PT/INR Recent Labs    07/15/2018 1402  LABPROT 15.4*  INR 1.23   ABG Recent Labs    07/06/2018 1516 07/13/18 0413  PHART 7.333* 7.410  HCO3 19.8* 22.0    Studies/Results: Dg Chest Port 1 View  Result Date: 07/12/2018 CLINICAL DATA:  Endotracheal tube, recent exploratory laparotomy. EXAM: PORTABLE CHEST 1 VIEW COMPARISON:  07/10/2018. FINDINGS: Endotracheal tube terminates 4.9 cm above the carina. Nasogastric tube is followed into the stomach. Right IJ central line tip projects at the junction of the brachiocephalic veins. Heart size stable. Lungs are low in volume with minimal left basilar atelectasis. No airspace consolidation or pleural fluid. No pneumothorax. IMPRESSION: Low lung volumes with minimal left basilar atelectasis. Electronically Signed   By: Lorin Picket M.D.   On: 07/12/2018 10:48   Dg Chest Port 1 View  Result Date:  07/21/2018 CLINICAL DATA:  Intubated patient. Bowel obstruction, peritoneal carcinomatosis. EXAM: PORTABLE CHEST 1 VIEW COMPARISON:  PA and lateral chest x-ray of July 10, 2018 FINDINGS: The lungs are reasonably well inflated and clear. A left-sided nodular density seen on yesterday's study is not evident today. The heart and pulmonary vascularity are normal. The mediastinum is normal in width. The endotracheal tube tip lies approximately 4 cm above the carina. The esophagogastric tube tip projects in the gastric cardia with the proximal port at or just above the GE junction. The right internal jugular venous catheter tip projects at the junction of the proximal and midportions of the SVC. IMPRESSION: Intubated patient. No acute cardiopulmonary abnormality. No free subdiaphragmatic gas collections are observed. Advancement of the nasogastric tube by 5-10 cm is needed to assure that the proximal port lies below the GE junction. Electronically Signed   By: David  Martinique M.D.   On: 07/03/2018 14:16    Anti-infectives: Anti-infectives (From admission, onward)   Start     Dose/Rate Route Frequency Ordered Stop   07/18/2018 1800  piperacillin-tazobactam (ZOSYN) IVPB 3.375 g     3.375 g 12.5 mL/hr over 240 Minutes Intravenous Every 8 hours 07/31/2018 1444     07/08/2018 0915  cefoTEtan (CEFOTAN) 2 g in sodium chloride 0.9 % 100 mL IVPB     2 g 200 mL/hr over 30 Minutes Intravenous To Short Stay 07/16/2018 0857 07/22/2018 1900   07/10/2018 0906  cefoTEtan in Dextrose 5% (CEFOTAN) 2-2.08 GM-%(50ML) IVPB  Note to Pharmacy:  [name]   : cabinet override      08/01/2018 0906 08/01/2018 2114   07/06/2018 1200  piperacillin-tazobactam (ZOSYN) IVPB 3.375 g  Status:  Discontinued     3.375 g 12.5 mL/hr over 240 Minutes Intravenous Every 8 hours 07/16/2018 0615 07/28/2018 1341   07/04/2018 0730  cefoTEtan (CEFOTAN) 2 g in sodium chloride 0.9 % 100 mL IVPB  Status:  Discontinued     2 g 200 mL/hr over 30 Minutes Intravenous On  call to O.R. 07/14/2018 0723 07/22/2018 0559   07/04/2018 0615  piperacillin-tazobactam (ZOSYN) IVPB 3.375 g     3.375 g 100 mL/hr over 30 Minutes Intravenous  Once 07/22/2018 0612 07/21/2018 1943      Assessment/Plan: POD 2 left colectomy, liver biopsy-Jerry Green  Neuro- continue iv pain control, sedation on vent CV/Pulm- CCM care for vent FEN-  cr better will continue to follow, continue foley, start trickle feeds today ID: [email or web address] 2/7 postop for gross contamination with stool perforated colon, Path pending Lovenox, scds Vac change today   LOS: 3 days    Rolm Bookbinder 07/13/2018

## 2018-07-13 NOTE — Progress Notes (Signed)
Nutrition Follow-up  DOCUMENTATION CODES:   Obesity unspecified  INTERVENTION:   Initiate trickle tube feeding via NG tube: - Vital High Protein @ 20 ml/hr (480 ml/day)  Trickle tube feeding regimen and current propofol provides 860 kcal, 42 grams of protein, and 403 ml of H2O.  Recommend advancing tube feeding rate as tolerated to goal rate of 25 ml/hr and adding Pro-stat 60 ml QID.  Tube feeding at goal rate and current propofol provides 1780 total kcal, 173 grams protein, and 504 ml free water (106% of needs).  If tube feeding to continue for several days, recommending switching NGT to small-bore Cortrak tube for comfort.  NUTRITION DIAGNOSIS:   Inadequate oral intake related to poor appetite, nausea, vomiting as evidenced by per patient/family report, NPO status.  Ongoing  GOAL:   Patient will meet greater than or equal to 90% of their needs  Unmet  MONITOR:   Diet advancement, I & O's, Labs, Skin  REASON FOR ASSESSMENT:   Consult, Ventilator Enteral/tube feeding initiation and management  ASSESSMENT:   62 year old male who presented with abdominal pain. PMH significant for hypertension, type 2 diabetes mellitus, GERD. 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  10/9 - s/p ex lap, liver biopsy, colectomy with open abdomen and wound VAC  Pt has remained intubated on ventilator support after surgery on 10/9.  Spoke with Surgery MD and Critical Care MD regarding tube feeding. Placed Verbal Consult with Readback per Surgery MD for RD to manage tube feeding.  Discussed pt with RN.  Patient is currently intubated on ventilator support. Pt with NGT currently to low-intermittent suction. MVe: 14.0 L/min Temp (24hrs), Avg:99.4 F (37.4 C), Min:98.6 F (37 C), Max:99.9 F (37.7 C)  Propofol: 14.4 ml/hr (provides 380  kcal/day) IVF: NaCl @ 75 ml/hr  Medications reviewed and include: sliding scale Novolog q 4 hours, IV Pepcid 20 mg q 12 hours, IV Zosyn  Labs reviewed: BUN 28 (H), creatinine 2.61 (H), elevated LFTs, hemoglobin 9.2 (L), HCT 29.4 (L) CBG's: 132, 151, 143, 121, 132, 125 x 24 hours  UOP: 1340 ml x 24 hours JP drain: 115 ml x 24 hours VAC: 0 ml x 24 hours Colostomy: 10 ml x 24 hours I/O's: +4.9 L since admit  Diet Order:   Diet Order            Diet NPO time specified  Diet effective now              EDUCATION NEEDS:   No education needs have been identified at this time  Skin:  Skin Assessment: Skin Integrity Issues: Wound Vac: open abdomen  Last BM:  10/10 - 10 ml via colostomy x 24 hours  Height:   Ht Readings from Last 1 Encounters:  07/13/2018 6' (1.829 m)    Weight:   Wt Readings from Last 1 Encounters:  07/09/2018 120.4 kg    Ideal Body Weight:  80.91 kg  BMI:  Body mass index is 36 kg/m.  Estimated Nutritional Needs:   Kcal:  5361-4431  Protein:  >/= 162 grams  Fluid:  >/= 1.8 L    Gaynell Face, MS, RD, LDN Inpatient Clinical Dietitian Pager: [phone] Weekend/After Hours: [phone]

## 2018-07-13 NOTE — Progress Notes (Signed)
Pt vomited tube feeds. Change in ET tube markings from 24cm to 23cm at lip. RT notified. Tube feeds stopped. LIS started. No bowel sounds audible.

## 2018-07-13 NOTE — Progress Notes (Signed)
Dressing change.

## 2018-07-13 NOTE — Consult Note (Signed)
[place] Nurse wound consult note: POD 2  [facility] Nurse wound consult note Reason for Consult:First surgical dressing change with Racheal Patches, CCS PA present. See photo in his note. Wound type: Surgical, NPWT  Pressure Injury POA: N/A Measurement:28cm x 5cm x 1.5cm Wound bed:red, moist with subcutaneous fat and sutures present Drainage (amount, consistency, odor) small amount serous Periwound: periwound maceration from 12-3 o'clock, also around umbilicus, otherwise intact  Dressing procedure/placement/frequency: 1 piece of black foam removed from wound bed, wound cleansed with NS and patted dry. Drape applied to periwound area. Two (2) pieces of black foam used to obliterate dead space taking care to avoid umbilicus. Drape applied and system attached to 13mmHg continuous negative pressure.  An immediate seal is achieved. No response from patient, who is on vent.  [place] Nurse ostomy consult note Stoma type/location: RUQ transverse colostomy Stomal assessment/size:2 inches round, edematous, raised and red.  Os in center Peristomal assessment: intact Treatment options for stomal/peristomal skin: skin barrier ring Output: 131mls liquid light brown stool Ostomy pouching: 2pc. 2 and 3/4 inch pouching system Education provided: None Enrolled patient in [facility] program: No   Next pouch change is due on Monday, 07/16/18.  NPWT and ostomy supplies are in room.  Ostomy educational materials are in room.  [facility] nursing team will not follow, but will remain available to this patient, the nursing and medical teams.  Please re-consult if needed. Thanks, [name], MSN, RN, [place], Arther Abbott  Pager# [phone]

## 2018-07-13 NOTE — Progress Notes (Signed)
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  Taken to OR 10/9 for ex lap and liver biopsy s/p colectomy and open abd with wound vac. SIGNIFICANT PAST MEDICAL HISTORY   HTN, DMT2, GERD, colon pylops  SIGNIFICANT EVENTS:  ex lap, left colectomy, biopsy of liver lesion>> 10/9 10/8 transferred from OSH STUDIES:   PTA OHS CT Abd >> CT abdomen pelvis revealedperforated obstructing large carcinoma of the splenic flexure of the colon with lymphatic metastases, peritoneal carcinomatosis, and liver metastases CULTURES:  BC at OSH   10/8 MRSA PCR >> neg  ANTIBIOTICS:  OHS zosyn and cipro  10/8 zosyn >> 10/9 pre op cefotetan  LINES/TUBES:  10/9 ETT >> 10/9 NGT >> 10/9 Foley >> 10/9 R DL IJ CVC >> 10/9 Wound vac >> CONSULTANTS:  10/8>> CCS 10/9 PCCM SUBJECTIVE:  Improving urine output, remains sedated and mechanically ventilated.  No hemodynamic instability, no pressors. Propofol for sedation  CONSTITUTIONAL: BP 123/82   Pulse (!) 116   Temp 99.3 F (37.4 C) (Oral)   Resp (!) 22   Ht 6' (1.829 m)   Wt 120.4 kg   SpO2 99%   BMI 36.00 kg/m   I/O last 3 completed shifts: In: 3983.3 [I.V.:1864.7; IV Piggyback:2118.6] Out: 1895 [KNLZJ:6734; Emesis/NG output:200; Drains:185; Stool:20]     Vent Mode: PRVC FiO2  (%):  [40 %] 40 % Set Rate:  [14 bmp] 14 bmp Vt Set:  [620 mL] 620 mL PEEP:  [5 cmH20] 5 cmH20 Plateau Pressure:  [16 cmH20-19 cmH20] 19 cmH20  PHYSICAL EXAM: General: Male patient  orally intubated, sedated with propofol, NAD, follows commands Neuro: Opens eyes m MAE x 4, follows commands, sedated Cardiovascular: S1 and S2 , RRR per tele, No RMG  There is no dependent edema. Lungs:Bilateral chest excursion Respirations are unlabored on full vent support, no wheezes , few rhonchi noted, diminished per bases Abdomen: The abdomen is obese and soft without any overt organomegaly masses or tenderness.  The ostomy mucosa is pink, there is minimal output from the JP drain, would vac in place to 125 mm HG negative pressure  Musculoskeletal:  No obvious deformities Skin: Warm, dry and intact, no rash or lesions noted   RESOLVED PROBLEM LIST   ASSESSMENT AND PLAN   62 year old diabetic status post laparotomy for abdominal pain with finding of a perforated sigmoid flexure secondary to a apparent primary mass with metastases. He has been oliguric overnight and I am fluid loading the patient this morning.  Continues empiric Zosyn for peritoneal soiling.  No plan to progress rapidly towards wean until we see how he responds to his substantial fluid boluses.  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 and liver mets - large output from JP drain since arrival to ICU - minimal drainage from JP and wound vac - Wound bed pink and moist, sub Q fat - Stoma pink and moist   Plan: Post op care- wound vac/ JP drain per CCS Starting trickle feeds 10/11 per CCS Wound vac dressing change 10/11 Scheduled ofirmev to reduce narcs   Elevated PCT Down trending from 59.4 to 25.62 on 10/11 Low grade fever Leukocytosis Plan: Trend fever/ WBC Trend PCT to guide ABX therapy Continue Zosyn ( Day 2/7) Culture as is clinically indicated  Colonic Mass - suspected peritoneal carcinomatosis, liver metastasis, and possible left kidney  Plan:  Follow pathology results CEA elevated at 94.4  Cardiovascular + 4 L since admission Hx. HTN Plan: Tele monitoring Goal MAP > 65 NS at 75 ml/hr Strict I/O's/ trend UOP PRN only Apresoline for now  Renal Creatinine down trending Calcium corrects with low albumin Plan: Trend BMET/ UO Replete electrolytes as needed Avoid nephrotoxic medications  DMT2 Plan CBG's Q 4 SSI HGB A1C  SUMMARY OF TODAY'S PLAN:  Stable post op>> wound pink and moist Consider PS trial  Wean sedation as able Follow pathology Continued stabilization in the ICU with weaning as able Best Practice / Goals of Care / Disposition.   DVT PROPHYLAXIS: Lovenox which may require adjustment should his renal function decline further SUP: Pepcid which also may require adjustment NUTRITION:Trickle feeds Vital HP MOBILITY:Bedrest  LABS  Glucose Recent Labs  Lab 07/12/18 1120 07/12/18 1554 07/12/18 1930 07/12/18 2325 07/13/18 0407 07/13/18 0833  GLUCAP 125* 132* 121* 143* 151* 132*    BMET Recent Labs  Lab 07/23/2018 1402 07/12/18 0340 07/13/18 0440  NA 136 137 140  K 4.6 4.9 3.8  CL 104 103 107  CO2 21* 22 23  BUN 16 24* 28*  CREATININE 1.61* 3.42* 2.61*  GLUCOSE 208* 179* 145*    Liver Enzymes Recent Labs  Lab 07/28/2018 0701 07/13/2018 1402 07/12/18 0340 07/13/18 0440  AST 24  --   --  49*  ALT 15  --   --  50*  ALKPHOS 94  --   --  50  BILITOT 0.9  --   --  1.1  ALBUMIN 3.2* 2.9* 3.0* 2.5*    Electrolytes Recent Labs   Lab 07/07/2018 1402 07/12/18 0340 07/13/18 0440  CALCIUM 8.4* 8.2* 8.1*  MG 1.7  --   --   PHOS 4.3 4.7*  --     CBC Recent Labs  Lab 07/26/2018 1402 07/12/18 0340 07/13/18 0440  WBC 7.0 14.8* 16.3*  HGB 12.1* 10.6* 9.2*  HCT 37.7* 34.0* 29.4*  PLT 517* 429* 359    ABG Recent Labs  Lab 07/24/2018 1507 07/08/2018 1516 07/13/18 0413  PHART 7.264* 7.333* 7.410  PCO2ART 45.3 37.4 35.3  PO2ART 59.0* 140.0* 133*    Coag's Recent Labs  Lab 07/09/2018 0701 07/29/2018 0748 07/30/2018 1402  APTT  --  33  --   INR 1.13  --  1.23    Sepsis Markers Recent Labs  Lab 07/12/18 0833 07/13/18 0440  PROCALCITON 59.40 25.62    Cardiac Enzymes No results for input(s): TROPONINI, PROBNP in the last 168 hours.  PAST MEDICAL HISTORY :   He  has a  past medical history of Diabetes mellitus type II, controlled ([place]), GERD (gastroesophageal reflux disease), and HTN (hypertension).  PAST SURGICAL HISTORY:  He  has a past surgical history that includes laparotomy (N/A, 07/03/2018); Colon resection (N/A, 08/01/2018); Liver biopsy (N/A, 29/02/1883); and Application if wound vac (N/A, 08/02/2018).  Allergies  Allergen Reactions  . Aspirin Nausea Only    No current facility-administered medications on file prior to encounter.    Current Outpatient Medications on File Prior to Encounter  Medication Sig  . allopurinol (ZYLOPRIM) 100 MG tablet Take 100 mg by mouth daily.  . enalapril (VASOTEC) 5 MG tablet Take 5 mg by mouth daily.  Marland Kitchen glimepiride (AMARYL) 2 MG tablet Take 2 mg by mouth 2 (two) times daily.  . metFORMIN (GLUCOPHAGE) 1000 MG tablet Take 1,000 mg by mouth 2 (two) times daily.  . pantoprazole (PROTONIX) 40 MG tablet Take 40 mg by mouth daily.  . simvastatin (ZOCOR) 40 MG tablet Take 40 mg by mouth daily.    FAMILY HISTORY:   His family history includes Diabetes in his maternal grandmother.  SOCIAL HISTORY:  He  reports that he has never smoked. He has never used smokeless  tobacco. He reports that he drank alcohol. He reports that he does not use drugs.  REVIEW OF SYSTEMS:    Unobtainable as he is sedated and  Intubated on mechanical ventilation  There is no family at bedside to update 10/11  Magdalen Spatz, AGACNP-BC [place] Pager # [phone] 07/13/2018 12:14 PM

## 2018-07-14 ENCOUNTER — Inpatient Hospital Stay (HOSPITAL_COMMUNITY): Payer: Medicaid - Out of State

## 2018-07-14 LAB — COMPREHENSIVE METABOLIC PANEL WITH GFR
ALT: 37 U/L (ref 0–44)
AST: 49 U/L — ABNORMAL HIGH (ref 15–41)
Albumin: 2.1 g/dL — ABNORMAL LOW (ref 3.5–5.0)
Alkaline Phosphatase: 61 U/L (ref 38–126)
Anion gap: 11 (ref 5–15)
BUN: 32 mg/dL — ABNORMAL HIGH (ref 8–23)
CO2: 22 mmol/L (ref 22–32)
Calcium: 8.5 mg/dL — ABNORMAL LOW (ref 8.9–10.3)
Chloride: 107 mmol/L (ref 98–111)
Creatinine, Ser: 2.32 mg/dL — ABNORMAL HIGH (ref 0.61–1.24)
GFR calc Af Amer: 33 mL/min — ABNORMAL LOW (ref >60)
GFR calc non Af Amer: 28 mL/min — ABNORMAL LOW (ref >60)
Glucose, Bld: 137 mg/dL — ABNORMAL HIGH (ref 70–99)
Potassium: 3.7 mmol/L (ref 3.5–5.1)
Sodium: 140 mmol/L (ref 135–145)
Total Bilirubin: 0.8 mg/dL (ref 0.3–1.2)
Total Protein: 6.3 g/dL — ABNORMAL LOW (ref 6.5–8.1)

## 2018-07-14 LAB — CBC
HCT: 27.3 % — ABNORMAL LOW (ref 39.0–52.0)
HEMATOCRIT: 26.7 % — AB (ref 39.0–52.0)
Hemoglobin: 8.4 g/dL — ABNORMAL LOW (ref 13.0–17.0)
Hemoglobin: 8.5 g/dL — ABNORMAL LOW (ref 13.0–17.0)
MCH: 27.8 pg (ref 26.0–34.0)
MCH: 28.1 pg (ref 26.0–34.0)
MCHC: 31.1 g/dL (ref 30.0–36.0)
MCHC: 31.5 g/dL (ref 30.0–36.0)
MCV: 89.2 fL (ref 80.0–100.0)
MCV: 89.3 fL (ref 80.0–100.0)
Platelets: 265 K/uL (ref 150–400)
Platelets: 324 10*3/uL (ref 150–400)
RBC: 2.99 MIL/uL — ABNORMAL LOW (ref 4.22–5.81)
RBC: 3.06 MIL/uL — ABNORMAL LOW (ref 4.22–5.81)
RDW: 13.7 % (ref 11.5–15.5)
RDW: 13.7 % (ref 11.5–15.5)
WBC: 15.3 10*3/uL — ABNORMAL HIGH (ref 4.0–10.5)
WBC: 15.4 K/uL — ABNORMAL HIGH (ref 4.0–10.5)
nRBC: 0.2 % (ref 0.0–0.2)
nRBC: 0.2 % (ref 0.0–0.2)

## 2018-07-14 LAB — GLUCOSE, CAPILLARY
GLUCOSE-CAPILLARY: 113 mg/dL — AB (ref 70–99)
GLUCOSE-CAPILLARY: 114 mg/dL — AB (ref 70–99)
GLUCOSE-CAPILLARY: 134 mg/dL — AB (ref 70–99)
Glucose-Capillary: 132 mg/dL — ABNORMAL HIGH (ref 70–99)
Glucose-Capillary: 115 mg/dL — ABNORMAL HIGH (ref 70–99)
Glucose-Capillary: 85 mg/dL (ref 70–99)

## 2018-07-14 LAB — TRIGLYCERIDES
Triglycerides: 324 mg/dL — ABNORMAL HIGH (ref <150)
Triglycerides: 290 mg/dL — ABNORMAL HIGH (ref <150)

## 2018-07-14 LAB — HEMOGLOBIN A1C
HEMOGLOBIN A1C: 7.4 % — AB (ref 4.8–5.6)
Mean Plasma Glucose: 165.68 mg/dL

## 2018-07-14 LAB — MAGNESIUM: MAGNESIUM: 2.2 mg/dL (ref 1.7–2.4)

## 2018-07-14 LAB — PROCALCITONIN: Procalcitonin: 18.62 ng/mL

## 2018-07-14 MED ORDER — ALBUMIN HUMAN 25 % IV SOLN
25.0000 g | Freq: Four times a day (QID) | INTRAVENOUS | Status: AC
  Administered 2018-07-14 – 2018-07-16 (×8): 25 g via INTRAVENOUS
  Filled 2018-07-14: qty 50
  Filled 2018-07-14 (×2): qty 100
  Filled 2018-07-14 (×4): qty 50
  Filled 2018-07-14: qty 100
  Filled 2018-07-14: qty 50

## 2018-07-14 NOTE — Progress Notes (Signed)
3 Days Post-Op   Subjective/Chief Complaint: Intubated, sedated, did not tolerate tube feeds   Objective: Vital signs in last 24 hours: Temp:  [98.7 F (37.1 C)-100.5 F (38.1 C)] 98.9 F (37.2 C) (10/12 0737) Pulse Rate:  [98-121] 98 (10/12 0700) Resp:  [13-26] 21 (10/12 0700) BP: (106-134)/(76-91) 124/78 (10/12 0700) SpO2:  [97 %-100 %] 99 % (10/12 0700) FiO2 (%):  [40 %] 40 % (10/12 0402) Weight:  [121.2 kg] 121.2 kg (10/12 0600) Last BM Date: 07/13/18  Intake/Output from previous day: 10/11 0701 - 10/12 0700 In: 2304 [I.V.:2062.2; IV Piggyback:241.8] Out: 1610 [Urine:1475; Drains:100; Stool:100] Intake/Output this shift: No intake/output data recorded.  Resp: coarse bilateral breath sounds Cardio: tachy rr GI: vac in place functional, no bs, stoma with stool in it, jp serosang  Lab Results:  Recent Labs    07/14/18 0032 07/14/18 0432  WBC 15.4* 15.3*  HGB 8.5* 8.4*  HCT 27.3* 26.7*  PLT 265 324   BMET Recent Labs    07/13/18 0440 07/14/18 0432  NA 140 140  K 3.8 3.7  CL 107 107  CO2 23 22  GLUCOSE 145* 137*  BUN 28* 32*  CREATININE 2.61* 2.32*  CALCIUM 8.1* 8.5*   PT/INR Recent Labs    07/18/2018 1402  LABPROT 15.4*  INR 1.23   ABG Recent Labs    07/16/2018 1516 07/13/18 0413  PHART 7.333* 7.410  HCO3 19.8* 22.0    Studies/Results: No results found.  Anti-infectives: Anti-infectives (From admission, onward)   Start     Dose/Rate Route Frequency Ordered Stop   07/20/2018 1800  piperacillin-tazobactam (ZOSYN) IVPB 3.375 g     3.375 g 12.5 mL/hr over 240 Minutes Intravenous Every 8 hours 07/16/2018 1444     07/18/2018 0915  cefoTEtan (CEFOTAN) 2 g in sodium chloride 0.9 % 100 mL IVPB     2 g 200 mL/hr over 30 Minutes Intravenous To Short Stay 07/08/2018 0857 07/29/2018 1900   07/29/2018 0906  cefoTEtan in Dextrose 5% (CEFOTAN) 2-2.08 GM-%(50ML) IVPB    Note to Pharmacy:  [name]   : cabinet override      07/16/2018 0906 07/10/2018 2114   07/23/2018 1200  piperacillin-tazobactam (ZOSYN) IVPB 3.375 g  Status:  Discontinued     3.375 g 12.5 mL/hr over 240 Minutes Intravenous Every 8 hours 07/06/2018 0615 07/25/2018 1341   08/02/2018 0730  cefoTEtan (CEFOTAN) 2 g in sodium chloride 0.9 % 100 mL IVPB  Status:  Discontinued     2 g 200 mL/hr over 30 Minutes Intravenous On call to O.R. 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

## 2018-07-14 NOTE — Progress Notes (Signed)
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  Taken to OR 10/9 for ex lap and liver biopsy s/p colectomy and open abd with wound vac. SIGNIFICANT PAST MEDICAL HISTORY   HTN, DMT2, GERD, colon pylops  SIGNIFICANT EVENTS:  ex lap, left colectomy, biopsy of liver lesion>> 10/9 10/8 transferred from OSH STUDIES:   PTA OHS CT Abd >> CT abdomen pelvis revealedperforated obstructing large carcinoma of the splenic flexure of the colon with lymphatic metastases, peritoneal carcinomatosis, and liver metastases Chest x-ray today shows an enlarging left pleural effusion CULTURES:  BC at OSH   10/8 MRSA PCR >> neg  ANTIBIOTICS:  OHS zosyn and cipro  10/8 zosyn >> 10/9 pre op cefotetan  LINES/TUBES:  10/9 ETT >> 10/9 NGT >> 10/9 Foley >> 10/9 R DL IJ CVC >> 10/9 Wound vac >> CONSULTANTS:  10/8>> CCS 10/9 PCCM SUBJECTIVE:  Sedated and not interactive during my examination, he remains intubated and mechanically ventilated.  He is on no pressors.  Urine output has remained adequate overnight.  CONSTITUTIONAL: BP 128/76   Pulse 98   Temp 98.9 F (37.2 C) (Oral)   Resp (!) 21   Ht 6' (1.829 m)   Wt 121.2 kg   SpO2 99%   BMI 36.24 kg/m   I/O last 3 completed shifts: In: 3424.4 [I.V.:3049.5; IV Piggyback:374.8] Out:  2490 [Urine:2225; Drains:155; Stool:110]     Vent Mode: PRVC FiO2 (%):  [30 %-40 %] 30 % Set Rate:  [14 bmp] 14 bmp Vt Set:  [782 mL] 620 mL PEEP:  [5 cmH20] 5 cmH20 Plateau Pressure:  [18 NFA21-30 cmH20] 21 cmH20  PHYSICAL EXAM: General: Obese middle-aged male who is orally intubated, sedated on propofol and in no acute distress.   Neuro: Moving all fours in response to noxious stimuli  Cardiovascular: S1 and S2 are regular without murmur rub or gallop.  There is no dependent edema.   Lungs: Respirations are unlabored, there is symmetric air movement, no wheezes, no rhonchi.  Saturations are in the high 90s on 30% and 5 PEEP Abdomen: Abdomen is obese and soft without any organomegaly masses or tenderness.  The ostomy mucosa is pink and the ostomy is functioning.  There is some serosanguineous drainage from the JP   Musculoskeletal:  No obvious deformities Skin: Warm, dry and intact, no rash or lesions noted   RESOLVED PROBLEM LIST   ASSESSMENT AND PLAN   62 year old diabetic status post laparotomy for abdominal pain with finding of a perforated sigmoid flexure secondary to a apparent primary mass with metastases. He has been oliguric overnight and I am fluid loading the patient this morning.  Continues empiric Zosyn for peritoneal soiling.   Diabetes is being controlled with sliding scale insulin Acute Respiratory Failure in post operative setting Plan: FiO2 is only 30% and he is on 5 of PEEP.  Switched him to pressure control ventilation this morning and will be attempting to decrease pressure support today.  If this is successful we will proceed to a spontaneous breathing trial.  If we are unsuccessful, we may need to mobilize his resuscitation fluid.  I am hesitant to do so until is clear that his renal function is stabilized.  I am going to be maintaining his intravascular volume with the addition of albumin today and then adding Lasix if his creatinine continues to drop.  PS trials  as able   Bowel Obstruction  With acute perforation 2/2 large obstructing mass of the splenic flexure of the colon with lymphatic metastases peritoneal carcinomatosis and liver mets - large output from JP drain since arrival to ICU - minimal drainage from JP and wound vac - Wound bed pink and moist, sub Q fat - Stoma pink and moist  Plan: Post op care- wound vac/ JP drain per CCS Starting trickle feeds 10/11 per CCS Wound vac dressing change 10/11 Scheduled ofirmev to reduce narcs   Elevated PCT Down trending from 59.4 to 25.62 on 10/11 Low grade fever Leukocytosis Plan: Leukocytosis is secondary to peritoneal soiling.  Continuing  Zosyn for now as it would appear that we are having an appropriate response to antibiotics.   Colonic Mass - suspected peritoneal carcinomatosis, liver metastasis, and possible left kidney  Plan:  Follow pathology results CEA elevated at 94.4  Cardiovascular + 4 L since admission Hx. HTN Plan: Tele monitoring Goal MAP > 65 NS at 75 ml/hr Strict I/O's/ trend UOP PRN only Apresoline for now  Renal Creatinine down trending, he is maintaining adequate urine output.  I am going to give him some exogenous albumin to maintain intravascular volume and if his creatinine continues to follow begin diuretics to mobilize resuscitation fluid.  DMT2 Plan CBG's Q 4 SSI HGB A1C  SUMMARY OF TODAY'S PLAN:  Stable post op>> wound pink and moist Consider PS trial  Wean sedation as able Follow pathology Continued stabilization in the ICU with weaning as able Best Practice / Goals of Care / Disposition.   DVT PROPHYLAXIS: Lovenox  SUP: Pepcid  MOBILITY:Bedrest  LABS  Glucose Recent Labs  Lab 07/13/18 1143 07/13/18 1515 07/13/18 1944 07/13/18 2340 07/14/18 0412 07/14/18 0738  GLUCAP 124* 122* 139* 149* 132* 134*    BMET Recent Labs  Lab 07/12/18 0340 07/13/18 0440 07/14/18 0432  NA 137 140 140  K 4.9 3.8 3.7  CL 103 107 107  CO2 22 23  22   BUN 24* 28* 32*  CREATININE 3.42* 2.61* 2.32*  GLUCOSE 179* 145* 137*    Liver Enzymes Recent Labs  Lab 08/01/2018 0701  07/12/18 0340 07/13/18 0440 07/14/18 0432  AST 24  --   --  49* 49*  ALT 15  --   --  50* 37  ALKPHOS 94  --   --  50 61  BILITOT 0.9  --   --  1.1 0.8  ALBUMIN 3.2*   < > 3.0* 2.5* 2.1*   < > = values in this interval not displayed.    Electrolytes Recent Labs  Lab 07/20/2018 1402 07/12/18 0340 07/13/18 0440 07/14/18 0432  CALCIUM 8.4* 8.2* 8.1* 8.5*  MG 1.7  --   --  2.2  PHOS 4.3 4.7*  --   --     CBC Recent Labs  Lab 07/13/18 0440 07/14/18 0032 07/14/18 0432  WBC 16.3* 15.4* 15.3*  HGB 9.2* 8.5* 8.4*  HCT 29.4* 27.3*  26.7*  PLT 359 265 324    ABG Recent Labs  Lab 07/12/2018 1507 08/01/2018 1516 07/13/18 0413  PHART 7.264* 7.333* 7.410  PCO2ART 45.3 37.4 35.3  PO2ART 59.0* 140.0* 133*    Coag's Recent Labs  Lab 07/06/2018 0701 07/09/2018 0748 07/06/2018 1402  APTT  --  33  --   INR 1.13  --  1.23    Sepsis Markers Recent Labs  Lab 07/12/18 0833 07/13/18 0440 07/14/18 0432  PROCALCITON 59.40 25.62 18.62    Cardiac Enzymes No results for input(s): TROPONINI, PROBNP in the last 168 hours.  PAST MEDICAL HISTORY :   He  has a past medical history of Diabetes mellitus type II, controlled ([place]), GERD (gastroesophageal reflux disease), and HTN (hypertension).  PAST SURGICAL HISTORY:  He  has a past surgical history that includes laparotomy (N/A, 07/20/2018); Colon resection (N/A, 07/24/2018); Liver biopsy (N/A, 19/02/931); and Application if wound vac (N/A, 07/13/2018).  Allergies  Allergen Reactions  . Aspirin Nausea Only    No current facility-administered medications on file prior to encounter.    Current Outpatient Medications on File Prior to Encounter  Medication Sig  . allopurinol (ZYLOPRIM) 100 MG tablet Take 100 mg by mouth daily.  . enalapril (VASOTEC) 5 MG tablet Take 5 mg by mouth daily.  Marland Kitchen glimepiride  (AMARYL) 2 MG tablet Take 2 mg by mouth 2 (two) times daily.  . metFORMIN (GLUCOPHAGE) 1000 MG tablet Take 1,000 mg by mouth 2 (two) times daily.  . pantoprazole (PROTONIX) 40 MG tablet Take 40 mg by mouth daily.  . simvastatin (ZOCOR) 40 MG tablet Take 40 mg by mouth daily.    FAMILY HISTORY:   His family history includes Diabetes in his maternal grandmother.  SOCIAL HISTORY:  He  reports that he has never smoked. He has never used smokeless tobacco. He reports that he drank alcohol. He reports that he does not use drugs.  REVIEW OF SYSTEMS:    Unobtainable as he is sedated and  Intubated on mechanical ventilation  There is no family at bedside to update 10/11  Magdalen Spatz, AGACNP-BC [place] Pager # [phone] 07/14/2018 8:55 AM   PULMONARY / CRITICAL CARE MEDICINE   NAME:  Jerry Green, MRN:  [id number], DOB:  Mar 02, 1956, LOS: 4 ADMISSION DATE:  07/19/2018, CONSULTATION DATE:  07/10/2018 REFERRING MD:  Jeanmarie Hubert, CHIEF COMPLAINT:  Abdominal pain  BRIEF HISTORY:       62 year old diabetic day 1 status post exploratory laparotomy with finding of perforated sigmoid colon secondary to an obstructing carcinoma HISTORY OF PRESENT ILLNESS   5 yoM with no prior known hx of malignancy transferred from OSH with 2-3 day hx of abd pain, N/V, found to have 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  Taken to OR 10/9 for ex lap and liver biopsy s/p colectomy and open abd with wound vac. SIGNIFICANT PAST MEDICAL HISTORY   HTN, DMT2, GERD, colon pylops  SIGNIFICANT EVENTS:  ex lap, left colectomy, biopsy of liver lesion>> 10/9 10/8 transferred from OSH STUDIES:   PTA OHS CT Abd >> CT abdomen pelvis revealedperforated obstructing large carcinoma of the splenic flexure of the colon with lymphatic  metastases, peritoneal carcinomatosis, and liver metastases CULTURES:  BC at OSH   10/8 MRSA PCR >> neg  ANTIBIOTICS:  OHS zosyn and cipro  10/8 zosyn >> 10/9 pre op cefotetan  LINES/TUBES:  10/9 ETT >> 10/9 NGT >> 10/9 Foley >> 10/9 R DL IJ CVC >> 10/9 Wound vac >> CONSULTANTS:  10/8>> CCS 10/9 PCCM SUBJECTIVE:  Improving urine output, remains sedated and mechanically ventilated.  No hemodynamic instability, no pressors. Propofol for sedation  CONSTITUTIONAL: BP 128/76   Pulse 98   Temp 98.9 F (37.2 C) (Oral)   Resp (!) 21   Ht 6' (1.829 m)   Wt 121.2 kg   SpO2 99%   BMI 36.24 kg/m   I/O last 3 completed shifts: In: 3424.4 [I.V.:3049.5; IV Piggyback:374.8] Out: 2490 [Urine:2225; Drains:155; Stool:110]     Vent Mode: PRVC FiO2 (%):  [30 %-40 %] 30 % Set Rate:  [14 bmp] 14 bmp Vt Set:  [620 mL] 620 mL PEEP:  [5 cmH20] 5 cmH20 Plateau Pressure:  [18 YSA63-01 cmH20] 21 cmH20  PHYSICAL EXAM: General: Male patient  orally intubated, sedated with propofol, NAD, follows commands Neuro: Opens eyes m MAE x 4, follows commands, sedated Cardiovascular: S1 and S2 , RRR per tele, No RMG  There is no dependent edema. Lungs:Bilateral chest excursion Respirations are unlabored on full vent support, no wheezes , few rhonchi noted, diminished per bases Abdomen: The abdomen is obese and soft without any overt organomegaly masses or tenderness.  The ostomy mucosa is pink, there is minimal output from the JP drain, would vac in place to 125 mm HG negative pressure  Musculoskeletal:  No obvious deformities Skin: Warm, dry and intact, no rash or lesions noted   RESOLVED PROBLEM LIST   ASSESSMENT AND PLAN   62 year old diabetic status post laparotomy for abdominal pain with finding of a perforated sigmoid flexure secondary to a apparent primary mass with metastases. He has been oliguric overnight and I am fluid loading the patient this morning.  Continues empiric Zosyn for  peritoneal soiling.  No plan to progress rapidly towards wean until we see how he responds to his substantial fluid boluses.  Diabetes is being controlled with sliding scale insulin Acute Respiratory Failure in post operative setting Plan: Wean FiO2 and PEEP as able PS trials as able CXR 10/12 ABG prn Albuterol nebs prn PAD protocol with propofol and prn fentanyl, RASS goal 0/-1 Wean sedation as able Trend triglycerides Check Mag 10/12  Bowel Obstruction  With acute perforation 2/2 large obstructing mass of the splenic flexure of the colon with lymphatic metastases peritoneal carcinomatosis and liver mets - large output from JP drain since arrival to ICU - minimal drainage from JP and wound vac - Wound bed pink and moist, sub Q fat - Stoma pink and moist  Plan: Post op care- wound vac/ JP drain per CCS Starting trickle feeds 10/11 per CCS Wound vac dressing change 10/11 Scheduled ofirmev to reduce narcs   Elevated PCT Down trending from 59.4 to 25.62 on 10/11 Low grade fever Leukocytosis Plan: Trend fever/ WBC Trend PCT to guide ABX therapy Continue Zosyn ( Day 2/7) Culture as is clinically indicated  Colonic Mass - suspected peritoneal carcinomatosis, liver metastasis, and possible left kidney  Plan:  Follow pathology results CEA elevated at 94.4  Cardiovascular + 4 L since admission Hx. HTN Plan: Tele monitoring Goal MAP > 65 NS at 75 ml/hr Strict I/O's/ trend UOP PRN only Apresoline for now  Renal Creatinine down trending Calcium corrects with low albumin Plan: Trend BMET/ UO Replete electrolytes as needed Avoid nephrotoxic medications  DMT2 Plan CBG's Q 4 SSI HGB A1C  SUMMARY OF TODAY'S PLAN:  Stable  post op>> wound pink and moist Consider PS trial  Wean sedation as able Follow pathology Continued stabilization in the ICU with weaning as able Best Practice / Goals of Care / Disposition.   DVT PROPHYLAXIS: Lovenox which may require adjustment  should his renal function decline further SUP: Pepcid which also may require adjustment NUTRITION:Trickle feeds Vital HP MOBILITY:Bedrest  LABS  Glucose Recent Labs  Lab 07/13/18 1143 07/13/18 1515 07/13/18 1944 07/13/18 2340 07/14/18 0412 07/14/18 0738  GLUCAP 124* 122* 139* 149* 132* 134*    BMET Recent Labs  Lab 07/12/18 0340 07/13/18 0440 07/14/18 0432  NA 137 140 140  K 4.9 3.8 3.7  CL 103 107 107  CO2 22 23 22   BUN 24* 28* 32*  CREATININE 3.42* 2.61* 2.32*  GLUCOSE 179* 145* 137*    Liver Enzymes Recent Labs  Lab 07/30/2018 0701  07/12/18 0340 07/13/18 0440 07/14/18 0432  AST 24  --   --  49* 49*  ALT 15  --   --  50* 37  ALKPHOS 94  --   --  50 61  BILITOT 0.9  --   --  1.1 0.8  ALBUMIN 3.2*   < > 3.0* 2.5* 2.1*   < > = values in this interval not displayed.    Electrolytes Recent Labs  Lab 07/20/2018 1402 07/12/18 0340 07/13/18 0440 07/14/18 0432  CALCIUM 8.4* 8.2* 8.1* 8.5*  MG 1.7  --   --  2.2  PHOS 4.3 4.7*  --   --     CBC Recent Labs  Lab 07/13/18 0440 07/14/18 0032 07/14/18 0432  WBC 16.3* 15.4* 15.3*  HGB 9.2* 8.5* 8.4*  HCT 29.4* 27.3* 26.7*  PLT 359 265 324    ABG Recent Labs  Lab 07/31/2018 1507 07/10/2018 1516 07/13/18 0413  PHART 7.264* 7.333* 7.410  PCO2ART 45.3 37.4 35.3  PO2ART 59.0* 140.0* 133*    Coag's Recent Labs  Lab 07/27/2018 0701 07/23/2018 0748 07/05/2018 1402  APTT  --  33  --   INR 1.13  --  1.23    Sepsis Markers Recent Labs  Lab 07/12/18 0833 07/13/18 0440 07/14/18 0432  PROCALCITON 59.40 25.62 18.62    Cardiac Enzymes No results for input(s): TROPONINI, PROBNP in the last 168 hours.  PAST MEDICAL HISTORY :   He  has a past medical history of Diabetes mellitus type II, controlled ([place]), GERD (gastroesophageal reflux disease), and HTN (hypertension).  PAST SURGICAL HISTORY:  He  has a past surgical history that includes laparotomy (N/A, 07/24/2018); Colon resection (N/A, 07/30/2018); Liver  biopsy (N/A, 18/01/6313); and Application if wound vac (N/A, 07/05/2018).  Allergies  Allergen Reactions  . Aspirin Nausea Only    No current facility-administered medications on file prior to encounter.    Current Outpatient Medications on File Prior to Encounter  Medication Sig  . allopurinol (ZYLOPRIM) 100 MG tablet Take 100 mg by mouth daily.  . enalapril (VASOTEC) 5 MG tablet Take 5 mg by mouth daily.  Marland Kitchen glimepiride (AMARYL) 2 MG tablet Take 2 mg by mouth 2 (two) times daily.  . metFORMIN (GLUCOPHAGE) 1000 MG tablet Take 1,000 mg by mouth 2 (two) times daily.  . pantoprazole (PROTONIX) 40 MG tablet Take 40 mg by mouth daily.  . simvastatin (ZOCOR) 40 MG tablet Take 40 mg by mouth daily.    FAMILY HISTORY:   His family history includes Diabetes in his maternal grandmother.  SOCIAL HISTORY:  He  reports that he has never  smoked. He has never used smokeless tobacco. He reports that he drank alcohol. He reports that he does not use drugs.  REVIEW OF SYSTEMS:    Unobtainable as he is sedated and  Intubated on mechanical ventilation  There is no family at bedside to update 10/11  Magdalen Spatz, AGACNP-BC [place] Pager # [phone] 07/14/2018 8:55 AM

## 2018-07-15 ENCOUNTER — Inpatient Hospital Stay (HOSPITAL_COMMUNITY): Payer: Medicaid - Out of State

## 2018-07-15 LAB — POCT I-STAT 3, ART BLOOD GAS (G3+)
ACID-BASE DEFICIT: 4 mmol/L — AB (ref 0.0–2.0)
Bicarbonate: 21.4 mmol/L (ref 20.0–28.0)
O2 Saturation: 98 %
PCO2 ART: 38.2 mmHg (ref 32.0–48.0)
PH ART: 7.358 (ref 7.350–7.450)
PO2 ART: 110 mmHg — AB (ref 83.0–108.0)
Patient temperature: 99.2
TCO2: 23 mmol/L (ref 22–32)

## 2018-07-15 LAB — BASIC METABOLIC PANEL
Anion gap: 13 (ref 5–15)
BUN: 38 mg/dL — ABNORMAL HIGH (ref 8–23)
CHLORIDE: 110 mmol/L (ref 98–111)
CO2: 20 mmol/L — ABNORMAL LOW (ref 22–32)
CREATININE: 2.55 mg/dL — AB (ref 0.61–1.24)
Calcium: 8.5 mg/dL — ABNORMAL LOW (ref 8.9–10.3)
GFR, EST AFRICAN AMERICAN: 29 mL/min — AB (ref >60)
GFR, EST NON AFRICAN AMERICAN: 25 mL/min — AB (ref >60)
Glucose, Bld: 116 mg/dL — ABNORMAL HIGH (ref 70–99)
POTASSIUM: 3.7 mmol/L (ref 3.5–5.1)
SODIUM: 143 mmol/L (ref 135–145)

## 2018-07-15 LAB — GLUCOSE, CAPILLARY
GLUCOSE-CAPILLARY: 101 mg/dL — AB (ref 70–99)
GLUCOSE-CAPILLARY: 130 mg/dL — AB (ref 70–99)
GLUCOSE-CAPILLARY: 97 mg/dL (ref 70–99)
Glucose-Capillary: 91 mg/dL (ref 70–99)
Glucose-Capillary: 88 mg/dL (ref 70–99)

## 2018-07-15 LAB — CBC WITH DIFFERENTIAL/PLATELET
ABS IMMATURE GRANULOCYTES: 0.12 10*3/uL — AB (ref 0.00–0.07)
BASOS ABS: 0 10*3/uL (ref 0.0–0.1)
Basophils Relative: 0 %
Eosinophils Absolute: 0.5 10*3/uL (ref 0.0–0.5)
Eosinophils Relative: 3 %
HCT: 22.8 % — ABNORMAL LOW (ref 39.0–52.0)
HEMOGLOBIN: 7.2 g/dL — AB (ref 13.0–17.0)
Immature Granulocytes: 1 %
LYMPHS PCT: 10 %
Lymphs Abs: 1.4 10*3/uL (ref 0.7–4.0)
MCH: 28.3 pg (ref 26.0–34.0)
MCHC: 31.6 g/dL (ref 30.0–36.0)
MCV: 89.8 fL (ref 80.0–100.0)
Monocytes Absolute: 1.3 10*3/uL — ABNORMAL HIGH (ref 0.1–1.0)
Monocytes Relative: 10 %
NEUTROS ABS: 10.2 10*3/uL — AB (ref 1.7–7.7)
NEUTROS PCT: 76 %
NRBC: 0.4 % — AB (ref 0.0–0.2)
PLATELETS: 324 10*3/uL (ref 150–400)
RBC: 2.54 MIL/uL — AB (ref 4.22–5.81)
RDW: 14 % (ref 11.5–15.5)
WBC: 13.5 10*3/uL — AB (ref 4.0–10.5)

## 2018-07-15 LAB — HEMOGLOBIN AND HEMATOCRIT, BLOOD
HCT: 26.2 % — ABNORMAL LOW (ref 39.0–52.0)
HEMOGLOBIN: 8.4 g/dL — AB (ref 13.0–17.0)

## 2018-07-15 LAB — PREPARE RBC (CROSSMATCH)

## 2018-07-15 MED ORDER — FUROSEMIDE 10 MG/ML IJ SOLN
40.0000 mg | Freq: Every day | INTRAMUSCULAR | Status: DC
  Administered 2018-07-15 – 2018-07-16 (×2): 40 mg via INTRAVENOUS
  Filled 2018-07-15 (×2): qty 4

## 2018-07-15 MED ORDER — SODIUM CHLORIDE 0.9% IV SOLUTION: Freq: Once | INTRAVENOUS | Status: DC

## 2018-07-15 NOTE — Progress Notes (Signed)
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  Taken to OR 10/9 for ex lap and liver biopsy s/p colectomy and open abd with wound vac. SIGNIFICANT PAST MEDICAL HISTORY   HTN, DMT2, GERD, colon pylops  SIGNIFICANT EVENTS:  ex lap, left colectomy, biopsy of liver lesion>> 10/9 10/8 transferred from OSH STUDIES:   PTA OHS CT Abd >> CT abdomen pelvis revealedperforated obstructing large carcinoma of the splenic flexure of the colon with lymphatic metastases, peritoneal carcinomatosis, and liver metastases Chest x-ray today shows an enlarging left pleural effusion CULTURES:  BC at OSH   10/8 MRSA PCR >> neg  ANTIBIOTICS:  OHS zosyn and cipro  10/8 zosyn >> 10/9 pre op cefotetan  LINES/TUBES:  10/9 ETT >> 10/9 NGT >> 10/9 Foley >> 10/9 R DL IJ CVC >> 10/9 Wound vac >> CONSULTANTS:  10/8>> CCS 10/9 PCCM SUBJECTIVE:  No unusual events overnight.  He is sedated on propofol during my visit and not interactive.  Continues to be intubated and mechanically ventilated.  He is on no pressors.  The ostomy is functioning.    CONSTITUTIONAL: BP 122/77   Pulse 85   Temp 97.7 F (36.5 C) (Oral)   Resp (!) 23   Ht 6' (1.829 m)   Wt 120 kg   SpO2 100%   BMI 35.88 kg/m   I/O last 3 completed shifts: In: 3630.6  [I.V.:3197.6; IV [id number]:536] Out: 6440 [Urine:1825; Emesis/NG output:1100; Drains:230; Stool:700]     Vent Mode: PCV FiO2 (%):  [30 %] 30 % Set Rate:  [14 bmp] 14 bmp PEEP:  [5 cmH20] 5 cmH20 Plateau Pressure:  [18 cmH20-20 cmH20] 19 cmH20  PHYSICAL EXAM: General: Obese 62 year old male who is orally intubated and mechanically ventilated, and in no distress.     Neuro: Moving all 4 spontaneously   Cardiovascular: S1 and S2 are regular without murmur rub or gallop.     Lungs: Respirations are unlabored, but minute ventilation remains slightly high at approximately 12 L/min.  There is symmetric air movement, no wheezes.   Abdomen: The abdomen is obese and soft with a midline VAC dressing.  There is a functioning right lower quadrant ostomy.  There is no unusual masses tenderness guarding or rebound.  Drainage from the JP is now serous    Musculoskeletal:  No obvious deformities Skin: Warm, dry and intact, no rash or lesions noted   RESOLVED PROBLEM LIST   ASSESSMENT AND PLAN   62 year old diabetic status post laparotomy for abdominal pain with finding of a perforated sigmoid flexure secondary to an apparent primary mass with metastases.  Acute Respiratory Failure in post operative setting Plan: FiO2 is only 30% and he is on 5 of PEEP.  He did not tolerate a spontaneous breathing trial yesterday.  I am hoping that if we can mobilize some fluid will be able to  separate him from mechanical ventilation today.  I have added Lasix to his albumin this morning without objective in mind.    PS trials as able   Bowel Obstruction  With acute perforation 2/2 large obstructing mass of the splenic flexure of the colon with lymphatic metastases peritoneal carcinomatosis and liver mets White count and procalcitonin have come down on Zosyn alone and I do not feel compelled to make any change in therapy at this point. bowel function has returned  Cardiovascular + 4 L since admission Hx.  HTN Plan: Tele monitoring Goal MAP > 65   Renal His creatinine is continued to trend downward after a bump following surgery and I am comfortable initiating diuresis.   DMT2 Plan CBG's Q 4 SSI HGB A1C  SUMMARY OF TODAY'S PLAN:  Stable post op>> wound pink and moist Consider PS trial  Wean sedation as able Follow pathology Continued stabilization in the ICU with weaning as able Best Practice / Goals of Care / Disposition.   DVT PROPHYLAXIS: Lovenox  SUP: Pepcid  MOBILITY:Bedrest  LABS  Glucose Recent Labs  Lab 07/14/18 1149 07/14/18 1619 07/14/18 1940 07/14/18 2333 07/15/18 0341 07/15/18 0718  GLUCAP 115* 113* 85 114* 101* 130*    BMET Recent Labs  Lab 07/13/18 0440 07/14/18 0432 07/15/18 0232  NA 140 140 143  K 3.8 3.7 3.7  CL 107 107 110  CO2 23 22 20*  BUN 28* 32* 38*  CREATININE 2.61* 2.32* 2.55*  GLUCOSE 145* 137* 116*    Liver Enzymes Recent Labs  Lab 07/30/2018 0701  07/12/18 0340 07/13/18 0440 07/14/18 0432  AST 24  --   --  49* 49*  ALT 15  --   --  50* 37  ALKPHOS 94  --   --  50 61  BILITOT 0.9  --   --  1.1 0.8  ALBUMIN 3.2*   < > 3.0* 2.5* 2.1*   < > = values in this interval not displayed.    Electrolytes Recent Labs  Lab 08/02/2018 1402 07/12/18 0340 07/13/18 0440 07/14/18 0432 07/15/18 0232  CALCIUM 8.4* 8.2* 8.1* 8.5* 8.5*  MG 1.7  --   --  2.2  --   PHOS 4.3 4.7*  --   --   --     CBC Recent Labs  Lab 07/14/18 0032 07/14/18 0432 07/15/18 0232  WBC 15.4* 15.3* 13.5*  HGB 8.5* 8.4* 7.2*  HCT 27.3* 26.7* 22.8*  PLT 265 324 324    ABG Recent Labs  Lab 07/19/2018 1516 07/13/18 0413 07/15/18 0416  PHART 7.333* 7.410 7.358  PCO2ART 37.4 35.3 38.2  PO2ART 140.0* 133* 110.0*    Coag's Recent Labs  Lab 07/23/2018 0701 07/04/2018 0748 07/03/2018 1402  APTT  --  33  --   INR 1.13  --  1.23    Sepsis Markers Recent Labs  Lab 07/12/18 0833 07/13/18 0440 07/14/18 0432  PROCALCITON 59.40 25.62 18.62     Cardiac Enzymes No results for input(s): TROPONINI, PROBNP in the last 168 hours.  PAST MEDICAL HISTORY :   He  has a past medical history of Diabetes mellitus type II, controlled ([place]), GERD (gastroesophageal reflux disease), and HTN (hypertension).  PAST SURGICAL HISTORY:  He  has a past surgical history that includes laparotomy (N/A, 07/25/2018); Colon resection (N/A, 07/27/2018); Liver biopsy (N/A, 57/05/4695); and Application if wound vac (N/A, 07/24/2018).  Allergies  Allergen Reactions  . Aspirin Nausea Only    No current facility-administered medications on  file prior to encounter.    Current Outpatient Medications on File Prior to Encounter  Medication Sig  . allopurinol (ZYLOPRIM) 100 MG tablet Take 100 mg by mouth daily.  . enalapril (VASOTEC) 5 MG tablet Take 5 mg by mouth daily.  Marland Kitchen glimepiride (AMARYL) 2 MG tablet Take 2 mg by mouth 2 (two) times daily.  . metFORMIN (GLUCOPHAGE) 1000 MG tablet Take 1,000 mg by mouth 2 (two) times daily.  . pantoprazole (PROTONIX) 40 MG tablet Take 40 mg by mouth daily.  . simvastatin (ZOCOR) 40 MG tablet Take 40 mg by mouth daily.    FAMILY HISTORY:   His family history includes Diabetes in his maternal grandmother.  SOCIAL HISTORY:  He  reports that he has never smoked. He has never used smokeless tobacco. He reports that he drank alcohol. He reports that he does not use drugs.  REVIEW OF SYSTEMS:    Unobtainable as he is sedated and  Intubated on mechanical ventilation  There is no family at bedside to update 10/11  Magdalen Spatz, AGACNP-BC [place] Pager # [phone] 07/15/2018 7:40 AM   PULMONARY / CRITICAL CARE MEDICINE   NAME:  Maddoxx Burkitt, MRN:  [id number], DOB:  [birth year], LOS: 5 ADMISSION DATE:  07/27/2018, CONSULTATION DATE:  07/10/2018 REFERRING MD:  Jeanmarie Hubert, CHIEF COMPLAINT:  Abdominal pain  BRIEF HISTORY:       62 year old diabetic day 1 status post exploratory  laparotomy with finding of perforated sigmoid colon secondary to an obstructing carcinoma HISTORY OF PRESENT ILLNESS   89 yoM with no prior known hx of malignancy transferred from OSH with 2-3 day hx of abd pain, N/V, found to have 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  Taken to OR 10/9 for ex lap and liver biopsy s/p colectomy and open abd with wound vac. SIGNIFICANT PAST MEDICAL HISTORY   HTN, DMT2, GERD, colon pylops  SIGNIFICANT EVENTS:  ex lap, left colectomy, biopsy of liver lesion>> 10/9 10/8 transferred from OSH STUDIES:   PTA OHS CT Abd >> CT abdomen pelvis revealedperforated obstructing large carcinoma of the splenic flexure of the colon with lymphatic metastases, peritoneal carcinomatosis, and liver metastases CULTURES:  BC at OSH   10/8 MRSA PCR >> neg  ANTIBIOTICS:  OHS zosyn and cipro  10/8 zosyn >> 10/9 pre op cefotetan  LINES/TUBES:  10/9 ETT >> 10/9 NGT >> 10/9 Foley >> 10/9 R DL IJ CVC >> 10/9 Wound vac >> CONSULTANTS:  10/8>> CCS 10/9 PCCM SUBJECTIVE:  Improving urine output, remains sedated and mechanically ventilated.  No hemodynamic instability, no pressors. Propofol for sedation  CONSTITUTIONAL: BP 122/77   Pulse 85   Temp 97.7 F (36.5 C) (Oral)   Resp (!) 23   Ht 6' (1.829 m)   Wt 120 kg   SpO2 100%   BMI 35.88 kg/m   I/O last 3 completed shifts: In: 3630.6 [I.V.:3197.6; IV [id number]:277] Out: 4128 [Urine:1825; Emesis/NG output:1100; Drains:230; Stool:700]     Vent Mode: PCV FiO2 (%):  [30 %] 30 % Set Rate:  [14 bmp] 14 bmp PEEP:  [5 cmH20] 5 cmH20 Plateau Pressure:  [18 NOM76-72 cmH20] 19 cmH20  PHYSICAL EXAM: General: Male patient  orally intubated, sedated with propofol, NAD, follows commands Neuro: Opens eyes m MAE x 4, follows commands, sedated Cardiovascular: S1 and S2 ,  RRR per tele, No RMG  There is  no dependent edema. Lungs:Bilateral chest excursion Respirations are unlabored on full vent support, no wheezes , few rhonchi noted, diminished per bases Abdomen: The abdomen is obese and soft without any overt organomegaly masses or tenderness.  The ostomy mucosa is pink, there is minimal output from the JP drain, would vac in place to 125 mm HG negative pressure  Musculoskeletal:  No obvious deformities Skin: Warm, dry and intact, no rash or lesions noted   RESOLVED PROBLEM LIST   ASSESSMENT AND PLAN   62 year old diabetic status post laparotomy for abdominal pain with finding of a perforated sigmoid flexure secondary to a apparent primary mass with metastases. He has been oliguric overnight and I am fluid loading the patient this morning.  Continues empiric Zosyn for peritoneal soiling.  No plan to progress rapidly towards wean until we see how he responds to his substantial fluid boluses.  Diabetes is being controlled with sliding scale insulin Acute Respiratory Failure in post operative setting Plan: Wean FiO2 and PEEP as able PS trials as able CXR 10/12 ABG prn Albuterol nebs prn PAD protocol with propofol and prn fentanyl, RASS goal 0/-1 Wean sedation as able Trend triglycerides Check Mag 10/12  Bowel Obstruction  With acute perforation 2/2 large obstructing mass of the splenic flexure of the colon with lymphatic metastases peritoneal carcinomatosis and liver mets - large output from JP drain since arrival to ICU - minimal drainage from JP and wound vac - Wound bed pink and moist, sub Q fat - Stoma pink and moist  Plan: Post op care- wound vac/ JP drain per CCS Starting trickle feeds 10/11 per CCS Wound vac dressing change 10/11 Scheduled ofirmev to reduce narcs   Elevated PCT Down trending from 59.4 to 25.62 on 10/11 Low grade fever Leukocytosis Plan: Trend fever/ WBC Trend PCT to guide ABX therapy Continue Zosyn ( Day 2/7) Culture as  is clinically indicated  Colonic Mass - suspected peritoneal carcinomatosis, liver metastasis, and possible left kidney  Plan:  Follow pathology results CEA elevated at 94.4  Cardiovascular + 4 L since admission Hx. HTN Plan: Tele monitoring Goal MAP > 65 NS at 75 ml/hr Strict I/O's/ trend UOP PRN only Apresoline for now  Renal Creatinine down trending Calcium corrects with low albumin Plan: Trend BMET/ UO Replete electrolytes as needed Avoid nephrotoxic medications  DMT2 Plan CBG's Q 4 SSI HGB A1C  SUMMARY OF TODAY'S PLAN:  Stable post op>> wound pink and moist Consider PS trial  Wean sedation as able Follow pathology Continued stabilization in the ICU with weaning as able Best Practice / Goals of Care / Disposition.   DVT PROPHYLAXIS: Lovenox which may require adjustment should his renal function decline further SUP: Pepcid which also may require adjustment NUTRITION:Trickle feeds Vital HP MOBILITY:Bedrest  LABS  Glucose Recent Labs  Lab 07/14/18 1149 07/14/18 1619 07/14/18 1940 07/14/18 2333 07/15/18 0341 07/15/18 0718  GLUCAP 115* 113* 85 114* 101* 130*    BMET Recent Labs  Lab 07/13/18 0440 07/14/18 0432 07/15/18 0232  NA 140 140 143  K 3.8 3.7 3.7  CL 107 107 110  CO2 23 22 20*  BUN 28* 32* 38*  CREATININE 2.61* 2.32* 2.55*  GLUCOSE 145* 137* 116*    Liver Enzymes Recent Labs  Lab 07/29/2018 0701  07/12/18 0340 07/13/18 0440 07/14/18 0432  AST 24  --   --  49* 49*  ALT 15  --   --  50* 37  ALKPHOS 94  --   --  50 61  BILITOT 0.9  --   --  1.1 0.8  ALBUMIN 3.2*   < > 3.0* 2.5* 2.1*   < > = values in this interval not displayed.    Electrolytes Recent Labs  Lab 07/13/2018 1402 07/12/18 0340 07/13/18 0440 07/14/18 0432 07/15/18 0232  CALCIUM 8.4* 8.2* 8.1* 8.5* 8.5*  MG 1.7  --   --  2.2  --   PHOS 4.3 4.7*  --   --   --     CBC Recent Labs  Lab 07/14/18 0032 07/14/18 0432 07/15/18 0232  WBC 15.4* 15.3* 13.5*   HGB 8.5* 8.4* 7.2*  HCT 27.3* 26.7* 22.8*  PLT 265 324 324    ABG Recent Labs  Lab 07/29/2018 1516 07/13/18 0413 07/15/18 0416  PHART 7.333* 7.410 7.358  PCO2ART 37.4 35.3 38.2  PO2ART 140.0* 133* 110.0*    Coag's Recent Labs  Lab 07/28/2018 0701 07/09/2018 0748 07/24/2018 1402  APTT  --  33  --   INR 1.13  --  1.23    Sepsis Markers Recent Labs  Lab 07/12/18 0833 07/13/18 0440 07/14/18 0432  PROCALCITON 59.40 25.62 18.62    Cardiac Enzymes No results for input(s): TROPONINI, PROBNP in the last 168 hours.  PAST MEDICAL HISTORY :   He  has a past medical history of Diabetes mellitus type II, controlled ([place]), GERD (gastroesophageal reflux disease), and HTN (hypertension).  PAST SURGICAL HISTORY:  He  has a past surgical history that includes laparotomy (N/A, 07/05/2018); Colon resection (N/A, 07/30/2018); Liver biopsy (N/A, 29/02/2840); and Application if wound vac (N/A, 07/21/2018).  Allergies  Allergen Reactions  . Aspirin Nausea Only    No current facility-administered medications on file prior to encounter.    Current Outpatient Medications on File Prior to Encounter  Medication Sig  . allopurinol (ZYLOPRIM) 100 MG tablet Take 100 mg by mouth daily.  . enalapril (VASOTEC) 5 MG tablet Take 5 mg by mouth daily.  Marland Kitchen glimepiride (AMARYL) 2 MG tablet Take 2 mg by mouth 2 (two) times daily.  . metFORMIN (GLUCOPHAGE) 1000 MG tablet Take 1,000 mg by mouth 2 (two) times daily.  . pantoprazole (PROTONIX) 40 MG tablet Take 40 mg by mouth daily.  . simvastatin (ZOCOR) 40 MG tablet Take 40 mg by mouth daily.    FAMILY HISTORY:   His family history includes Diabetes in his maternal grandmother.  SOCIAL HISTORY:  He  reports that he has never smoked. He has never used smokeless tobacco. He reports that he drank alcohol. He reports that he does not use drugs.  REVIEW OF SYSTEMS:    Unobtainable as he is sedated and  Intubated on mechanical ventilation  There is no family  at bedside to update 10/11  Lars Masson, MD 07/15/2018 7:40 AM

## 2018-07-15 NOTE — Progress Notes (Signed)
[place] Silos, MD verbal order to start patients tube feeds back at 20 to see if he can tolerate it. Will administer and monitor closely.  Lucius Conn, RN

## 2018-07-15 NOTE — Progress Notes (Signed)
4 Days Post-Op   Subjective/Chief Complaint: On vent Did not wean well yesterday   Objective: Vital signs in last 24 hours: Temp:  [97.7 F (36.5 C)-100.3 F (37.9 C)] 97.7 F (36.5 C) (10/13 0717) Pulse Rate:  [84-107] 91 (10/13 0800) Resp:  [19-40] 28 (10/13 0800) BP: (114-146)/(68-90) 126/79 (10/13 0800) SpO2:  [97 %-100 %] 100 % (10/13 0800) FiO2 (%):  [30 %] 30 % (10/13 0800) Weight:  [120 kg] 120 kg (10/13 0259) Last BM Date: 07/15/18  Intake/Output from previous day: 10/12 0701 - 10/13 0700 In: 2361.9 [I.V.:2070.9; IV Piggyback:291] Out: 8676 [Urine:1225; Emesis/NG output:1100; Drains:150; Stool:700] Intake/Output this shift: Total I/O In: 406.8 [I.V.:373.7; IV Piggyback:33] Out: 300 [Urine:125; Emesis/NG output:50; Drains:25; Stool:100]  General appearance: no distress Resp: clear to auscultation bilaterally Cardio: regular rate and rhythm GI: soft, VAC on midline, stool in bag  Lab Results:  Recent Labs    07/14/18 0432 07/15/18 0232  WBC 15.3* 13.5*  HGB 8.4* 7.2*  HCT 26.7* 22.8*  PLT 324 324   BMET Recent Labs    07/14/18 0432 07/15/18 0232  NA 140 143  K 3.7 3.7  CL 107 110  CO2 22 20*  GLUCOSE 137* 116*  BUN 32* 38*  CREATININE 2.32* 2.55*  CALCIUM 8.5* 8.5*   PT/INR No results for input(s): LABPROT, INR in the last 72 hours. ABG Recent Labs    07/13/18 0413 07/15/18 0416  PHART 7.410 7.358  HCO3 22.0 21.4    Studies/Results: Dg Abd 1 View  Result Date: 07/15/2018 CLINICAL DATA:  Nasogastric tube placement. EXAM: ABDOMEN - 1 VIEW COMPARISON:  Chest radiograph 1 day prior FINDINGS: Nasogastric terminates at the body of the stomach. Probable small left pleural effusion with adjacent left lower lobe airspace disease. Gas-filled small bowel loops in the upper left abdomen. No gross free intraperitoneal air. IMPRESSION: Nasogastric tube terminating at the body of the stomach. Electronically Signed   By: Abigail Miyamoto M.D.   On:  07/15/2018 08:18   Dg Chest Port 1 View  Result Date: 07/14/2018 CLINICAL DATA:  Respiratory failure. EXAM: PORTABLE CHEST 1 VIEW COMPARISON:  One-view chest x-ray 07/12/2018 FINDINGS: The heart size is normal. Endotracheal tube is stable position. The side port of the NG tube is in the distal esophagus. A right IJ line is stable. A left pleural effusion and basilar airspace disease is increasing. The right lung is clear. Lung volumes are low. IMPRESSION: 1. The side port of the NG tube is in the esophagus and could be advanced for more optimal positioning. 2. Support apparatus is otherwise stable. Satisfactory positioning of endotracheal tube. 3. Low lung volumes with progressive left pleural effusion and airspace disease, likely atelectasis. Infection is not excluded. Electronically Signed   By: San Morelle M.D.   On: 07/14/2018 08:26    Anti-infectives: Anti-infectives (From admission, onward)   Start     Dose/Rate Route Frequency Ordered Stop   07/23/2018 1800  piperacillin-tazobactam (ZOSYN) IVPB 3.375 g     3.375 g 12.5 mL/hr over 240 Minutes Intravenous Every 8 hours 07/27/2018 1444     07/17/2018 0915  cefoTEtan (CEFOTAN) 2 g in sodium chloride 0.9 % 100 mL IVPB     2 g 200 mL/hr over 30 Minutes Intravenous To Short Stay 07/08/2018 0857 07/12/2018 1900   07/06/2018 0906  cefoTEtan in Dextrose 5% (CEFOTAN) 2-2.08 GM-%(50ML) IVPB    Note to Pharmacy:  [name]   : cabinet override      07/25/2018  3382 07/22/2018 2114   07/12/2018 1200  piperacillin-tazobactam (ZOSYN) IVPB 3.375 g  Status:  Discontinued     3.375 g 12.5 mL/hr over 240 Minutes Intravenous Every 8 hours 07/03/2018 0615 07/29/2018 1341   07/12/2018 0730  cefoTEtan (CEFOTAN) 2 g in sodium chloride 0.9 % 100 mL IVPB  Status:  Discontinued     2 g 200 mL/hr over 30 Minutes Intravenous On call to O.R. 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

## 2018-07-15 NOTE — Progress Notes (Signed)
MD verbal order to transfuse 1 Unit packed red blood cells and to give 10 am lasix with the blood. Will administer and continue to monitor closely.  Lucius Conn, RN

## 2018-07-15 NOTE — Procedures (Signed)
Extubation Procedure Note  Patient Details:   Name: Vi Whitesel DOB: 22-Sep-1956 MRN: [id number]   Airway Documentation:    Vent end date: 07/15/18 Vent end time: 1211   Evaluation  O2 sats: stable throughout Complications: No apparent complications Patient did tolerate procedure well. Bilateral Breath Sounds: Clear, Diminished   Yes   Pt extubated to 3L [place] per MD order. Positive cuff leak noted prior to extubation. Pt able to speak and has a weak non productive cough post extubation. No increased WOB, no distress noted, no stridor heard. Pt encouraged to use [place] to clear secretions. RT will continue to monitor.   Jesse Sans 07/15/2018, 12:11 PM

## 2018-07-15 NOTE — Progress Notes (Signed)
Attempted to call daughter for telephone consent for blood. Call went straight to voicemail. This RN left message. Will attempt to call again shortly.  Lucius Conn, RN

## 2018-07-16 ENCOUNTER — Inpatient Hospital Stay (HOSPITAL_COMMUNITY): Payer: Medicaid - Out of State

## 2018-07-16 ENCOUNTER — Inpatient Hospital Stay: Payer: Self-pay

## 2018-07-16 DIAGNOSIS — K6389 Other specified diseases of intestine: Secondary | ICD-10-CM

## 2018-07-16 DIAGNOSIS — N19 Unspecified kidney failure: Secondary | ICD-10-CM

## 2018-07-16 DIAGNOSIS — I10 Essential (primary) hypertension: Secondary | ICD-10-CM

## 2018-07-16 DIAGNOSIS — D649 Anemia, unspecified: Secondary | ICD-10-CM

## 2018-07-16 DIAGNOSIS — C787 Secondary malignant neoplasm of liver and intrahepatic bile duct: Secondary | ICD-10-CM

## 2018-07-16 DIAGNOSIS — C786 Secondary malignant neoplasm of retroperitoneum and peritoneum: Secondary | ICD-10-CM

## 2018-07-16 DIAGNOSIS — C772 Secondary and unspecified malignant neoplasm of intra-abdominal lymph nodes: Secondary | ICD-10-CM

## 2018-07-16 DIAGNOSIS — C185 Malignant neoplasm of splenic flexure: Secondary | ICD-10-CM | POA: Insufficient documentation

## 2018-07-16 DIAGNOSIS — E876 Hypokalemia: Secondary | ICD-10-CM

## 2018-07-16 DIAGNOSIS — K56609 Unspecified intestinal obstruction, unspecified as to partial versus complete obstruction: Secondary | ICD-10-CM

## 2018-07-16 DIAGNOSIS — N179 Acute kidney failure, unspecified: Secondary | ICD-10-CM

## 2018-07-16 LAB — GLUCOSE, CAPILLARY
GLUCOSE-CAPILLARY: 110 mg/dL — AB (ref 70–99)
GLUCOSE-CAPILLARY: 113 mg/dL — AB (ref 70–99)
GLUCOSE-CAPILLARY: 111 mg/dL — AB (ref 70–99)
GLUCOSE-CAPILLARY: 122 mg/dL — AB (ref 70–99)
Glucose-Capillary: 107 mg/dL — ABNORMAL HIGH (ref 70–99)
Glucose-Capillary: 145 mg/dL — ABNORMAL HIGH (ref 70–99)
Glucose-Capillary: 95 mg/dL (ref 70–99)

## 2018-07-16 LAB — POCT I-STAT 3, ART BLOOD GAS (G3+)
Acid-base deficit: 5 mmol/L — ABNORMAL HIGH (ref 0.0–2.0)
Bicarbonate: 19.1 mmol/L — ABNORMAL LOW (ref 20.0–28.0)
O2 Saturation: 95 %
TCO2: 20 mmol/L — AB (ref 22–32)
pCO2 arterial: 29.8 mmHg — ABNORMAL LOW (ref 32.0–48.0)
pH, Arterial: 7.415 (ref 7.350–7.450)
pO2, Arterial: 75 mmHg — ABNORMAL LOW (ref 83.0–108.0)

## 2018-07-16 LAB — CBC WITH DIFFERENTIAL/PLATELET
Abs Immature Granulocytes: 0.18 10*3/uL — ABNORMAL HIGH (ref 0.00–0.07)
Basophils Absolute: 0.1 10*3/uL (ref 0.0–0.1)
Basophils Relative: 0 %
EOS ABS: 0.4 10*3/uL (ref 0.0–0.5)
EOS PCT: 2 %
HEMATOCRIT: 26.3 % — AB (ref 39.0–52.0)
Hemoglobin: 8.3 g/dL — ABNORMAL LOW (ref 13.0–17.0)
Immature Granulocytes: 1 %
LYMPHS ABS: 1.3 10*3/uL (ref 0.7–4.0)
Lymphocytes Relative: 8 %
MCH: 28 pg (ref 26.0–34.0)
MCHC: 31.6 g/dL (ref 30.0–36.0)
MCV: 88.9 fL (ref 80.0–100.0)
MONO ABS: 1.8 10*3/uL — AB (ref 0.1–1.0)
Monocytes Relative: 12 %
Neutro Abs: 11.3 10*3/uL — ABNORMAL HIGH (ref 1.7–7.7)
Neutrophils Relative %: 77 %
Platelets: 258 10*3/uL (ref 150–400)
RBC: 2.96 MIL/uL — ABNORMAL LOW (ref 4.22–5.81)
RDW: 13.9 % (ref 11.5–15.5)
WBC: 14.9 10*3/uL — ABNORMAL HIGH (ref 4.0–10.5)
nRBC: 0.4 % — ABNORMAL HIGH (ref 0.0–0.2)

## 2018-07-16 LAB — BASIC METABOLIC PANEL
ANION GAP: 14 (ref 5–15)
BUN: 51 mg/dL — ABNORMAL HIGH (ref 8–23)
CALCIUM: 9 mg/dL (ref 8.9–10.3)
CHLORIDE: 111 mmol/L (ref 98–111)
CO2: 20 mmol/L — AB (ref 22–32)
CREATININE: 3.1 mg/dL — AB (ref 0.61–1.24)
GFR calc Af Amer: 23 mL/min — ABNORMAL LOW (ref >60)
GFR calc non Af Amer: 20 mL/min — ABNORMAL LOW (ref >60)
Glucose, Bld: 114 mg/dL — ABNORMAL HIGH (ref 70–99)
Potassium: 2.9 mmol/L — ABNORMAL LOW (ref 3.5–5.1)
Sodium: 145 mmol/L (ref 135–145)

## 2018-07-16 LAB — TYPE AND SCREEN
ABO/RH(D): O POS
ANTIBODY SCREEN: NEGATIVE
UNIT DIVISION: 0

## 2018-07-16 LAB — BPAM RBC
BLOOD PRODUCT EXPIRATION DATE: 201911112359
ISSUE DATE / TIME: 201910131101
UNIT TYPE AND RH: 5100

## 2018-07-16 MED ORDER — TRAVASOL 10 % IV SOLN
INTRAVENOUS | Status: DC
  Administered 2018-07-16: 17:00:00 via INTRAVENOUS
  Filled 2018-07-16: qty 528

## 2018-07-16 MED ORDER — POTASSIUM CHLORIDE 10 MEQ/50ML IV SOLN
10.0000 meq | INTRAVENOUS | Status: AC
  Administered 2018-07-16 (×5): 10 meq via INTRAVENOUS
  Filled 2018-07-16 (×5): qty 50

## 2018-07-16 MED ORDER — SODIUM CHLORIDE 0.9% FLUSH
10.0000 mL | INTRAVENOUS | Status: DC | PRN
  Administered 2018-07-20: 20 mL
  Administered 2018-07-24 – 2018-07-25 (×2): 10 mL
  Administered 2018-08-02: 20 mL
  Administered 2018-08-07: 10 mL
  Filled 2018-07-16 (×5): qty 40

## 2018-07-16 NOTE — Progress Notes (Signed)
5 Days Post-Op   Subjective/Chief Complaint: Pt off vent NGT in place "Abdomen feels tight."   Objective: Vital signs in last 24 hours: Temp:  [98.4 F (36.9 C)-100.2 F (37.9 C)] 98.6 F (37 C) (10/14 0753) Pulse Rate:  [88-103] 91 (10/14 0600) Resp:  [11-38] 17 (10/14 0600) BP: (121-148)/(74-89) 143/82 (10/14 0600) SpO2:  [93 %-100 %] 95 % (10/14 0600) FiO2 (%):  [30 %] 30 % (10/13 1130) Weight:  [114.9 kg] 114.9 kg (10/14 0600) Last BM Date: 07/15/18  Intake/Output from previous day: 10/13 0701 - 10/14 0700 In: 1774.2 [I.V.:857.9; Blood:452; IV Piggyback:464.3] Out: 5361 [Urine:3000; Emesis/NG output:1400; Drains:120; Stool:900] Intake/Output this shift: No intake/output data recorded.  General appearance: alert and cooperative GI: soft, distended, hypoactive BS, ostomy with some gas and liquid output, vac in place  Lab Results:  Recent Labs    07/15/18 0232 07/15/18 1729 07/16/18 0410  WBC 13.5*  --  14.9*  HGB 7.2* 8.4* 8.3*  HCT 22.8* 26.2* 26.3*  PLT 324  --  258   BMET Recent Labs    07/15/18 0232 07/16/18 0410  NA 143 145  K 3.7 2.9*  CL 110 111  CO2 20* 20*  GLUCOSE 116* 114*  BUN 38* 51*  CREATININE 2.55* 3.10*  CALCIUM 8.5* 9.0   PT/INR No results for input(s): LABPROT, INR in the last 72 hours. ABG Recent Labs    07/15/18 0416 07/16/18 0403  PHART 7.358 7.415  HCO3 21.4 19.1*    Studies/Results: Dg Abd 1 View  Result Date: 07/15/2018 CLINICAL DATA:  Nasogastric tube placement. EXAM: ABDOMEN - 1 VIEW COMPARISON:  Chest radiograph 1 day prior FINDINGS: Nasogastric terminates at the body of the stomach. Probable small left pleural effusion with adjacent left lower lobe airspace disease. Gas-filled small bowel loops in the upper left abdomen. No gross free intraperitoneal air. IMPRESSION: Nasogastric tube terminating at the body of the stomach. Electronically Signed   By: Abigail Miyamoto M.D.   On: 07/15/2018 08:18   Dg Chest Port 1  View  Result Date: 07/16/2018 CLINICAL DATA:  Shortness of breath. History of pleural effusion, peritoneal carcinomatosis EXAM: PORTABLE CHEST 1 VIEW COMPARISON:  Portable chest x-ray of July 14 2018 FINDINGS: The lungs are mildly hypoinflated. There is persistent increased density in the left mid and lower lung. Small left pleural effusion. The right lung is clear. The heart is normal in size. The hilar structures are prominent greatest on the left. The esophagogastric tube tip projects in the gastric cardia with the proximal port at the level of the GE junction. The right internal jugular venous catheter tip projects over the junction of the proximal and midportions of the SVC. The bony structures are unremarkable. IMPRESSION: Interval extubation of the trachea. Persistent bilateral hypoinflation. Left basilar atelectasis or pneumonia with small left pleural effusion, stable. The esophagogastric tube merits advancement by between 5 and 10 cm to assure that the proximal port is positioned below the GE junction. Electronically Signed   By: David  Martinique M.D.   On: 07/16/2018 07:32    Anti-infectives: Anti-infectives (From admission, onward)   Start     Dose/Rate Route Frequency Ordered Stop   07/14/2018 1800  piperacillin-tazobactam (ZOSYN) IVPB 3.375 g     3.375 g 12.5 mL/hr over 240 Minutes Intravenous Every 8 hours 07/16/2018 1444     07/26/2018 0915  cefoTEtan (CEFOTAN) 2 g in sodium chloride 0.9 % 100 mL IVPB     2 g 200 mL/hr over 30  Minutes Intravenous To Short Stay 07/22/2018 0857 07/18/2018 1900   07/31/2018 0906  cefoTEtan in Dextrose 5% (CEFOTAN) 2-2.08 GM-%(50ML) IVPB    Note to Pharmacy:  [name]   : cabinet override      07/15/2018 0906 07/08/2018 2114   07/21/2018 1200  piperacillin-tazobactam (ZOSYN) IVPB 3.375 g  Status:  Discontinued     3.375 g 12.5 mL/hr over 240 Minutes Intravenous Every 8 hours 07/13/2018 0615 07/18/2018 1341   07/18/2018 0730  cefoTEtan (CEFOTAN) 2 g in sodium chloride  0.9 % 100 mL IVPB  Status:  Discontinued     2 g 200 mL/hr over 30 Minutes Intravenous On call to O.R. 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

## 2018-07-16 NOTE — Consult Note (Signed)
[facility] Nurse wound follow up Wound type: Midline abdominal surgical wound Measurement: 26 cm x 5 x 2 cm  Wound bed: red with blue sutures noted.   Drainage (amount, consistency, odor) Minimal serosanguinous noted in canister Periwound:intact with RUQ transverse colostomy Dressing procedure/placement/frequency: Vac dressing change every M/W/F.  1 piece black foam  WOC Nurse ostomy follow up Stoma type/location: RUQ colostomy Stomal assessment/size: 2"  Pale, remains edematous with moderate soft brown stool noted in pouch Peristomal assessment: intact.  Barrier ring to promote seal.  Convex pouch Treatment options for stomal/peristomal skin: convexity Output soft brown stool Ostomy pouching: 1pc. Convex with barrier ring Education provided: none today.  Enrolled patient in [facility] Start Discharge program: No WOC team will follow.  Domenic Moras MSN, RN, FNP-BC [facility]ON Wound, Ostomy, Continence Nurse Pager [phone]

## 2018-07-16 NOTE — Evaluation (Signed)
Physical Therapy Evaluation Patient Details Name: Jerry Green MRN: [id number] DOB: Nov 14, 1955 Today's Date: 07/16/2018   History of Present Illness   Jerry Green is a 62 y.o. male with medical history significant of HTN, DM type 2, and Gerd; who initially presented to [facility] with complaints of progressively worsening right lower quadrant abdominal pain over the last 3-4 days. 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  Taken to OR 10/9 for ex lap and liver biopsy s/p colectomy and open abd with wound vac.  Clinical Impression  Pt with noted R LE weakness and difficulty with ambulation requiring maxA and RW with 2nd person for chair follow. Pt motivated and would greatly benefit from CIR upon d/c as he demonstrates excellent rehab potential and can achieve independence upon completion of CIR. Pt was indep PTA and plans to return to being indep. Acute PT to cont to follow.    Follow Up Recommendations CIR    Equipment Recommendations  None recommended by PT(TBD)    Recommendations for Other Services       Precautions / Restrictions Precautions Precautions: Fall Precaution Comments: wound vac and jp at abdomen Restrictions Weight Bearing Restrictions: No      Mobility  Bed Mobility Overal bed mobility: Needs Assistance Bed Mobility: Sit to Supine       Sit to supine: Mod assist;+2 for physical assistance   General bed mobility comments: pt up in chair, modA for trunk descending to bed and LE management back up into bed  Transfers Overall transfer level: Needs assistance Equipment used: Rolling walker (2 wheeled) Transfers: Sit to/from [facility] Sit to Stand: Mod assist;+2 physical assistance Stand  pivot transfers: Min assist;+2 physical assistance       General transfer comment: max encouragement to push up from arm rests, minA for R foot placement, modAx2 to power up, pt able to complete std pvt back to bed with minAx2 s/p ambulation, much better than inital stand from chair  Ambulation/Gait Ambulation/Gait assistance: Max assist;+2 safety/equipment Gait Distance (Feet): 10 Feet Assistive device: Rolling walker (2 wheeled) Gait Pattern/deviations: Step-to pattern;Decreased step length - right;Decreased stride length;Decreased dorsiflexion - right;Staggering right;Narrow base of support Gait velocity: slow Gait velocity interpretation: <1.8 ft/sec, indicate of risk for recurrent falls General Gait Details: pt with R lateral lean, inability to clear R foot and noted adduction when R LE assisted during swing phase, pt stated his R leg feels different  Stairs            Wheelchair Mobility    Modified Rankin (Stroke Patients Only)       Balance Overall balance assessment: Needs assistance Sitting-balance support: Feet supported;Bilateral upper extremity supported Sitting balance-Leahy Scale: Fair     Standing balance support: Bilateral upper extremity supported;During functional activity Standing balance-Leahy Scale: Poor Standing balance comment: dependent on RW and physical assist                             Pertinent Vitals/Pain Pain Assessment: 0-10 Pain Score: 5  Pain Location: surgical site Pain Descriptors / Indicators: Aching Pain Intervention(s): Monitored during session    Home Living Family/patient expects to be discharged to:: Inpatient rehab  Additional Comments: lives alone    Prior Function Level of Independence: Independent         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     Hand Dominance   Dominant Hand: Right    Extremity/Trunk Assessment   Upper Extremity Assessment Upper Extremity  Assessment: Overall WFL for tasks assessed    Lower Extremity Assessment Lower Extremity Assessment: RLE deficits/detail RLE Deficits / Details: grossly 3/5, difficulty advancing during amb    Cervical / Trunk Assessment Cervical / Trunk Assessment: Other exceptions Cervical / Trunk Exceptions: abdominal surgery  Communication   Communication: No difficulties  Cognition Arousal/Alertness: Awake/alert Behavior During Therapy: WFL for tasks assessed/performed Overall Cognitive Status: Within Functional Limits for tasks assessed                                        General Comments General comments (skin integrity, edema, etc.): VSS    Exercises     Assessment/Plan    PT Assessment Patient needs continued PT services  PT Problem List Decreased strength;Decreased activity tolerance;Decreased mobility;Decreased balance;Decreased coordination;Decreased knowledge of use of DME;Decreased safety awareness       PT Treatment Interventions DME instruction;Gait training;Stair training;Functional mobility training;Therapeutic activities;Therapeutic exercise;Balance training    PT Goals (Current goals can be found in the Care Plan section)  Acute Rehab PT Goals Patient Stated Goal: get stronger PT Goal Formulation: With patient Time For Goal Achievement: 07/30/18 Potential to Achieve Goals: Good    Frequency Min 4X/week   Barriers to discharge Decreased caregiver support lives alone    Co-evaluation               AM-PAC PT "6 Clicks" Daily Activity  Outcome Measure Difficulty turning over in bed (including adjusting bedclothes, sheets and blankets)?: Unable Difficulty moving from lying on back to sitting on the side of the bed? : Unable Difficulty sitting down on and standing up from a chair with arms (e.g., wheelchair, bedside commode, etc,.)?: Unable Help needed moving to and from a bed to chair (including a wheelchair)?: A Lot Help needed walking in  hospital room?: A Lot Help needed climbing 3-5 steps with a railing? : A Lot 6 Click Score: 9    End of Session Equipment Utilized During Treatment: Gait belt Activity Tolerance: Patient limited by fatigue Patient left: in bed;with call bell/phone within reach;with nursing/sitter in room Nurse Communication: Mobility status PT Visit Diagnosis: Unsteadiness on feet (R26.81)    Time: 8182-9937 PT Time Calculation (min) (ACUTE ONLY): 26 min   Charges:   PT Evaluation $PT Eval Moderate Complexity: 1 Mod PT Treatments $Gait Training: 8-22 mins        Kittie Plater, PT, DPT Acute Rehabilitation Services Pager #: [phone] Office #: [phone]   Berline Lopes 07/16/2018, 2:27 PM

## 2018-07-16 NOTE — Progress Notes (Signed)
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  Taken to OR 10/9 for ex lap and liver biopsy s/p colectomy and open abd with wound vac. SIGNIFICANT PAST MEDICAL HISTORY   HTN, DMT2, GERD, colon pylops  SIGNIFICANT EVENTS:  ex lap, left colectomy, biopsy of liver lesion>> 10/9 10/8 transferred from OSH STUDIES:   PTA OHS CT Abd >> CT abdomen pelvis revealedperforated obstructing large carcinoma of the splenic flexure of the colon with lymphatic metastases, peritoneal carcinomatosis, and liver metastases Chest x-ray today shows an enlarging left pleural effusion CULTURES:  BC at OSH   10/8 MRSA PCR >> neg  ANTIBIOTICS:  OHS zosyn and cipro  10/8 zosyn >> 10/9 pre op cefotetan  LINES/TUBES:  10/9 ETT >> 10/9 NGT >> 10/9 Foley >> 10/9 R DL IJ CVC >> 10/9 Wound vac >> CONSULTANTS:  10/8>> CCS 10/9 PCCM SUBJECTIVE:  No unusual events overnight.  He is sedated on propofol during my visit and not interactive.  Continues to be intubated and mechanically ventilated.  He is on no pressors.  The ostomy is functioning.    CONSTITUTIONAL: BP 131/74   Pulse (!) 101   Temp 98.6 F (37 C) (Oral)   Resp (!) 37   Ht 6' (1.829 m)   Wt 114.9 kg   SpO2 94%   BMI 34.35 kg/m   I/O last 3 completed shifts: In: 2834.1  [I.V.:1735.1; Blood:452; IV Piggyback:647.1] Out: 8938 [Urine:3575; Emesis/NG output:2300; Drains:190; Stool:1050]     Vent Mode: PCV FiO2 (%):  [30 %] 30 % Set Rate:  [14 bmp] 14 bmp PEEP:  [5 cmH20] 5 cmH20  PHYSICAL EXAM: General: Obese middle-aged male who is orally intubated and mechanically ventilated, and in no distress.     Neuro: Moving all 4 spontaneously   Cardiovascular: S1 and S2 are regular without murmur rub or gallop.     Lungs: Respirations are unlabored, but minute ventilation remains slightly high at approximately 12 L/min.  There is symmetric air movement, no wheezes.   Abdomen: The abdomen is obese and soft with a midline VAC dressing.  There is a functioning right lower quadrant ostomy.  There is no unusual masses tenderness guarding or rebound.  Drainage from the JP is now serous    Musculoskeletal:  No obvious deformities Skin: Warm, dry and intact, no rash or lesions noted   RESOLVED PROBLEM LIST   ASSESSMENT AND PLAN   62 year old diabetic status post laparotomy for abdominal pain with finding of a perforated sigmoid flexure secondary to an apparent primary mass with metastases.  Acute Respiratory Failure in post operative setting Plan: FiO2 is only 30% and he is on 5 of PEEP.  He did not tolerate a spontaneous breathing trial yesterday.  I am hoping that if we can mobilize some fluid will be able to separate him from mechanical ventilation today.  I have added Lasix to his albumin this morning without objective in mind.    PS trials as able   Bowel Obstruction  With acute perforation 2/2 large obstructing mass of the splenic flexure of the colon with lymphatic metastases peritoneal carcinomatosis and liver mets White count and procalcitonin have come down on Zosyn alone and I do not feel compelled to make any change in therapy at this point. bowel function has returned  Cardiovascular + 4 L since admission Hx. HTN Plan: Tele monitoring Goal MAP >  65   Renal His creatinine is continued to trend downward after a bump following surgery and I am comfortable initiating diuresis.   DMT2 Plan CBG's Q 4 SSI HGB A1C  SUMMARY OF TODAY'S PLAN:  Stable post op>> wound pink and moist Consider PS trial  Wean sedation as able Follow pathology Continued stabilization in the ICU with weaning as able Best Practice / Goals of Care / Disposition.   DVT PROPHYLAXIS: Lovenox  SUP: Pepcid  MOBILITY:Bedrest  LABS  Glucose Recent Labs  Lab 07/15/18 1212 07/15/18 1550 07/15/18 1932 07/15/18 2339 07/16/18 0402 07/16/18 0730  GLUCAP 97 91 88 95 107* 110*    BMET Recent Labs  Lab 07/14/18 0432 07/15/18 0232 07/16/18 0410  NA 140 143 145  K 3.7 3.7 2.9*  CL 107 110 111  CO2 22 20* 20*  BUN 32* 38* 51*  CREATININE 2.32* 2.55* 3.10*  GLUCOSE 137* 116* 114*    Liver Enzymes Recent Labs  Lab 07/13/2018 0701  07/12/18 0340 07/13/18 0440 07/14/18 0432  AST 24  --   --  49* 49*  ALT 15  --   --  50* 37  ALKPHOS 94  --   --  50 61  BILITOT 0.9  --   --  1.1 0.8  ALBUMIN 3.2*   < > 3.0* 2.5* 2.1*   < > = values in this interval not displayed.    Electrolytes Recent Labs  Lab 07/09/2018 1402 07/12/18 0340  07/14/18 0432 07/15/18 0232 07/16/18 0410  CALCIUM 8.4* 8.2*   < > 8.5* 8.5* 9.0  MG 1.7  --   --  2.2  --   --   PHOS 4.3 4.7*  --   --   --   --    < > = values in this interval not displayed.    CBC Recent Labs  Lab 07/14/18 0432 07/15/18 0232 07/15/18 1729 07/16/18 0410  WBC 15.3* 13.5*  --  14.9*  HGB 8.4* 7.2* 8.4* 8.3*  HCT 26.7* 22.8* 26.2* 26.3*  PLT 324 324  --  258    ABG Recent Labs  Lab 07/13/18 0413 07/15/18 0416 07/16/18 0403  PHART 7.410 7.358 7.415  PCO2ART 35.3 38.2 29.8*  PO2ART 133* 110.0* 75.0*    Coag's Recent Labs  Lab 07/03/2018 0701 07/09/2018 0748 07/15/2018 1402  APTT  --  33  --   INR 1.13  --  1.23    Sepsis Markers Recent Labs  Lab 07/12/18 0833 07/13/18 0440  07/14/18 0432  PROCALCITON 59.40 25.62 18.62    Cardiac Enzymes No results for input(s): TROPONINI, PROBNP in the last 168 hours.  PAST MEDICAL HISTORY :   He  has a past medical history of Diabetes mellitus type II, controlled ([place]), GERD (gastroesophageal reflux disease), and HTN (hypertension).  PAST SURGICAL HISTORY:  He  has a past surgical history that includes laparotomy (N/A, 07/05/2018); Colon resection (N/A, 07/20/2018); Liver biopsy (N/A, 07/11/2018);  and Application if wound vac (N/A, 07/25/2018).  Allergies  Allergen Reactions  . Aspirin Nausea Only    No current facility-administered medications on file prior to encounter.    Current Outpatient Medications on File Prior to Encounter  Medication Sig  . allopurinol (ZYLOPRIM) 100 MG tablet Take 100 mg by mouth daily.  . enalapril (VASOTEC) 5 MG tablet Take 5 mg by mouth daily.  Marland Kitchen glimepiride (AMARYL) 2 MG tablet Take 2 mg by mouth 2 (two) times daily.  . metFORMIN (GLUCOPHAGE) 1000 MG tablet Take 1,000 mg by mouth 2 (two) times daily.  . pantoprazole (PROTONIX) 40 MG tablet Take 40 mg by mouth daily.  . simvastatin (ZOCOR) 40 MG tablet Take 40 mg by mouth daily.    FAMILY HISTORY:   His family history includes Diabetes in his maternal grandmother.  SOCIAL HISTORY:  He  reports that he has never smoked. He has never used smokeless tobacco. He reports that he drank alcohol. He reports that he does not use drugs.  REVIEW OF SYSTEMS:    Unobtainable as he is sedated and  Intubated on mechanical ventilation  There is no family at bedside to update 10/11  Magdalen Spatz, AGACNP-BC [place] Pager # [phone] 07/16/2018 10:48 AM   PULMONARY / CRITICAL CARE MEDICINE   NAME:  Jerry Green, MRN:  [id number], DOB:  03-16-1956, LOS: 6 ADMISSION DATE:  07/28/2018, CONSULTATION DATE:  07/08/2018 REFERRING MD:  Jeanmarie Hubert, CHIEF COMPLAINT:  Abdominal pain  BRIEF HISTORY:        62 year old diabetic day 1 status post exploratory laparotomy with finding of perforated sigmoid colon secondary to an obstructing carcinoma HISTORY OF PRESENT ILLNESS   42 yoM with no prior known hx of malignancy transferred from OSH with 2-3 day hx of abd pain, N/V, found to have 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  Taken to OR 10/9 for ex lap and liver biopsy s/p colectomy and open abd with wound vac. SIGNIFICANT PAST MEDICAL HISTORY   HTN, DMT2, GERD, colon pylops  SIGNIFICANT EVENTS:  ex lap, left colectomy, biopsy of liver lesion>> 10/9 10/8 transferred from OSH  STUDIES:   PTA OHS CT Abd >> CT abdomen pelvis revealedperforated obstructing large carcinoma of the splenic flexure of the colon with lymphatic metastases, peritoneal carcinomatosis, and liver metastases  CULTURES:  BC at OSH   10/8 MRSA PCR >> neg  ANTIBIOTICS:  OHS zosyn and cipro  10/8 zosyn >> 10/9 pre op cefotetan  LINES/TUBES:  10/9 ETT >> 10/9 NGT >> 10/9 Foley >> 10/9 R DL IJ CVC >> 10/9 Wound vac >>  CONSULTANTS:  10/8>> CCS 10/9 PCCM 10/14 H/O>>>  SUBJECTIVE:  No events overnight, feels abdomen is tight  CONSTITUTIONAL: BP 131/74   Pulse (!) 101   Temp 98.6 F (37 C) (Oral)   Resp (!) 37   Ht 6' (1.829 m)   Wt 114.9 kg   SpO2 94%   BMI 34.35 kg/m   I/O last 3 completed shifts: In: 2834.1 [I.V.:1735.1; Blood:452; IV Piggyback:647.1] Out: 9702 [Urine:3575; Emesis/NG output:2300; Drains:190; Stool:1050]     Vent Mode: PCV FiO2 (%):  [30 %] 30 % Set Rate:  [14 bmp] 14 bmp PEEP:  [5 cmH20] 5 cmH20  PHYSICAL EXAM: General: Adult male, in chair, NAD Neuro: Alert and interactive, moving all ext to command Cardiovascular: RRR, Nl S1/S2 and -M/R/G  Lungs: CTA bilaterally Abdomen: Soft, diffusely tender and -BS Musculoskeletal:  Intact Skin: Intact, wound noted  I reviewed CXR myself, no acute disease noted  RESOLVED PROBLEM LIST   ASSESSMENT AND PLAN   62 year old diabetic status post laparotomy for abdominal pain with finding of a perforated sigmoid flexure secondary to a apparent primary mass with metastases. He has been oliguric overnight and I am fluid loading the patient this morning.  Continues empiric Zosyn for peritoneal soiling.  No plan to progress rapidly towards wean until we see how he responds to his substantial fluid boluses.  Diabetes is being controlled with sliding scale insulin Acute Respiratory Failure in post operative setting Plan: Titrate O2 for sat of 88-92% SLP Ambulate OOB to chair  Bowel Obstruction  With acute perforation 2/2 large obstructing mass of the splenic flexure of the colon with lymphatic metastases peritoneal carcinomatosis and liver mets - minimal drainage from JP and wound vac - Wound bed pink and moist, sub Q fat - Stoma pink and moist  Plan: Post op care- wound vac/ JP drain per CCS TPN PICC line Wound vac dressing per CCS Scheduled ofirmev to reduce narcs   Elevated PCT Down trending from 59.4 to 25.62 on 10/11 Low grade fever Leukocytosis Plan: Trend fever/ WBC Continue Zosyn ( Day 3/7) Culture as is clinically indicated  Colonic Mass - suspected peritoneal carcinomatosis, liver metastasis, and possible left kidney  Plan:  Path with adenoCA of the colon Oncology consult called to Dr. Lindi Adie CEA elevated at 94.4  Cardiovascular + 4 L since admission Hx. HTN Plan: Tele monitoring KVO IVF Hold lasix given renal function Strict I/O's/ trend UOP PRN only Apresoline for now  Renal Creatinine down trending Calcium corrects with low albumin Plan: Trend BMET/ UO Replete electrolytes as needed D/C lasix Avoid nephrotoxic medications  DMT2 Plan CBG's Q 4 SSI HGB A1C  SUMMARY OF TODAY'S PLAN:  Oncology consult called, transfer to SDU and  to [facility] service with PCCM off 10/15 Discussed with PCCM-NP and TRH-MD  Best Practice / Goals of Care / Disposition.   DVT PROPHYLAXIS: Lovenox which may require adjustment should his renal function decline further SUP: Pepcid which also may require adjustment NUTRITION:Trickle feeds Vital HP MOBILITY:Bedrest  LABS  Glucose Recent Labs  Lab 07/15/18 1212 07/15/18 1550 07/15/18 1932 07/15/18 2339 07/16/18 0402 07/16/18 0730  GLUCAP 97 91 88 95 107* 110*    BMET Recent Labs  Lab 07/14/18 0432 07/15/18 0232 07/16/18 0410  NA 140 143 145  K 3.7 3.7 2.9*  CL 107 110 111  CO2 22 20* 20*  BUN 32* 38* 51*  CREATININE 2.32* 2.55* 3.10*  GLUCOSE 137* 116* 114*    Liver Enzymes Recent Labs  Lab 07/24/2018 0701  07/12/18 0340 07/13/18 0440 07/14/18 0432  AST 24  --   --  49* 49*  ALT 15  --   --  50* 37  ALKPHOS 94  --   --  50 61  BILITOT 0.9  --   --  1.1 0.8  ALBUMIN 3.2*   < > 3.0* 2.5* 2.1*   < > = values in this interval not displayed.    Electrolytes Recent Labs  Lab 07/24/2018 1402 07/12/18 0340  07/14/18 0432 07/15/18 0232 07/16/18 0410  CALCIUM 8.4* 8.2*   < > 8.5* 8.5* 9.0  MG 1.7  --   --  2.2  --   --   PHOS 4.3 4.7*  --   --   --   --    < > =  values in this interval not displayed.    CBC Recent Labs  Lab 07/14/18 0432 07/15/18 0232 07/15/18 1729 07/16/18 0410  WBC 15.3* 13.5*  --  14.9*  HGB 8.4* 7.2* 8.4* 8.3*  HCT 26.7* 22.8* 26.2* 26.3*  PLT 324 324  --  258    ABG Recent Labs  Lab 07/13/18 0413 07/15/18 0416 07/16/18 0403  PHART 7.410 7.358 7.415  PCO2ART 35.3 38.2 29.8*  PO2ART 133* 110.0* 75.0*    Coag's Recent Labs  Lab 07/15/2018 0701 08/02/2018 0748 07/03/2018 1402  APTT  --  33  --   INR 1.13  --  1.23    Sepsis Markers Recent Labs  Lab 07/12/18 0833 07/13/18 0440 07/14/18 0432  PROCALCITON 59.40 25.62 18.62    Cardiac Enzymes No results for input(s): TROPONINI, PROBNP in the last 168 hours.  Rush Farmer, M.D. [facility] Pulmonary/Critical Care Medicine. Pager: [phone]. After hours pager: [phone].

## 2018-07-16 NOTE — Progress Notes (Signed)
HEMATOLOGY-ONCOLOGY PROGRESS NOTE  SUBJECTIVE: Patient was admitted as a transfer from [facility] for Perforated splenic flexure of the colon tumor. Scans done over there showed peritoneal carcinomatosis and liver mets. Dr.Wakefield performed a laparotomy, left hemicolectomy and biopsy of the liver lesion. The final path came back as adenocarcinoma and liver biopsy was also positive. He was extubated and we are consulted to discuss options. He has a NG tube in place. No family available at this time. He just had a PICC line placed.  OBJECTIVE: REVIEW OF SYSTEMS:    Abd distention and discomfort from recent surgery  PHYSICAL EXAMINATION: ECOG PERFORMANCE STATUS: 3 - Symptomatic, >50% confined to bed  Vitals:   07/16/18 1300 07/16/18 1400  BP: (!) 148/79 (!) 150/81  Pulse: 90 87  Resp: (!) 44 (!) 31  Temp:    SpO2: 94% 95%   Filed Weights   07/14/18 0600 07/15/18 0259 07/16/18 0600  Weight: 267 lb 3.2 oz (121.2 kg) 264 lb 8.8 oz (120 kg) 253 lb 4.9 oz (114.9 kg)    GENERAL:alert SKIN: skin color, texture, turgor are normal, no rashes or significant lesions EYES:  sclera clear OROPHARYNX:NG  NECK: supple, thyroid normal size, non-tender, without nodularity LUNGS: clear  HEART: regular rate & rhythm and no murmurs and no lower extremity edema ABDOMEN: has a colostomy and wound vac NEURO:  no focal motor/sensory deficits  LABORATORY DATA:  I have reviewed the data as listed CMP Latest Ref Rng & Units 07/16/2018 07/15/2018 07/14/2018  Glucose 70 - 99 mg/dL 114(H) 116(H) 137(H)  BUN 8 - 23 mg/dL 51(H) 38(H) 32(H)  Creatinine 0.61 - 1.24 mg/dL 3.10(H) 2.55(H) 2.32(H)  Sodium 135 - 145 mmol/L 145 143 140  Potassium 3.5 - 5.1 mmol/L 2.9(L) 3.7 3.7  Chloride 98 - 111 mmol/L 111 110 107  CO2 22 - 32 mmol/L 20(L) 20(L) 22  Calcium 8.9 - 10.3 mg/dL 9.0 8.5(L) 8.5(L)  Total Protein 6.5 - 8.1 g/dL - - 6.3(L)  Total Bilirubin 0.3 - 1.2 mg/dL - - 0.8  Alkaline Phos 38 - 126 U/L - -  61  AST 15 - 41 U/L - - 49(H)  ALT 0 - 44 U/L - - 37    Lab Results  Component Value Date   WBC 14.9 (H) 07/16/2018   HGB 8.3 (L) 07/16/2018   HCT 26.3 (L) 07/16/2018   MCV 88.9 07/16/2018   PLT 258 07/16/2018   NEUTROABS 11.3 (H) 07/16/2018    ASSESSMENT AND PLAN: 1. Metastatic Colon Cancer: Recovering from recent surgery. I spoke to Dr.Sherill who will be caring for the patient in the out patient setting with regards to chemotherapy for metastatic colon cancer. Molecular testing will be requested on the path. Pre Op CEA: 94.4  2. Anemia: Hb 8.3: Supportive care 3. Renal failure: BUN: 51, Cr: 3.1 4. Hypokalemia: K: 2.9 Dr.Sherill will stop by and see the patient in the next couple of days. Patient has a daughter and an Ex-wife that care for him.

## 2018-07-16 NOTE — Progress Notes (Signed)
Peripherally Inserted Central Catheter/Midline Placement  The IV Nurse has discussed with the patient and/or persons authorized to consent for the patient, the purpose of this procedure and the potential benefits and risks involved with this procedure.  The benefits include less needle sticks, lab draws from the catheter, and the patient may be discharged home with the catheter. Risks include, but not limited to, infection, bleeding, blood clot (thrombus formation), and puncture of an artery; nerve damage and irregular heartbeat and possibility to perform a PICC exchange if needed/ordered by physician.  Alternatives to this procedure were also discussed.  Bard Power PICC patient education guide, fact sheet on infection prevention and patient information card has been provided to patient /or left at bedside.    PICC/Midline Placement Documentation        Darlyn Read 07/16/2018, 2:50 PM

## 2018-07-16 NOTE — Progress Notes (Addendum)
Inpatient Rehabilitation  Per PT request, patient was screened by Gunnar Fusi for appropriateness for an Inpatient Acute Rehab consult.  At this time we are recommending an Inpatient Rehab consult as well as OT evaluate and treat orders.  Please order if you are agreeable.    Carmelia Roller., CCC/SLP Admission Coordinator  [place]  Cell [phone]

## 2018-07-16 NOTE — Progress Notes (Signed)
[name] CONSULT NOTE   Pharmacy Consult for TPN Indication: prolonged ileus  Patient Measurements: Height: 6' (182.9 cm) Weight: 253 lb 4.9 oz (114.9 kg) IBW/kg (Calculated) : 77.6 TPN AdjBW (KG): 88.3 Body mass index is 34.35 kg/m.  Assessment:  89 yom transferred from OSH with abdominal pain, N/V and found to have an acute perforated/obstructing large carcinoma in the colon with multiple metastases. S/p OR 10/9 for exlap and liver biopsy with colectomy. Attempted TF but with almost immediate vomiting. To start TPN today for nutrition support.   GI: s/p colectomy with going ileus - daily OP 120, NG OP 1400 Endo: Hx DM - CBGs currently well controlled Insulin requirements in the past 24 hours: 1 unit Lytes: K 2.9 (supplementing with 5 runs K) Renal: Scr trending up to 3.1, UOP 1.1 Pulm: RA Cards: VSS Hepatobil: AST mildly elevated, albumin trending down to 2.1, prealbumin 13.2 Neuro: A&O x 4 ID: Zosyn postop for gross contamination with stool perforated colon - Tmax 100.2, WBC 14.9  TPN Access: CVC TPN start date: 10/14>> Nutritional Goals (per RD recommendation on 10/14): KCal: 2400-2600 KCal Protein: 120-135 g  Fluid: >/= 2L/day  Goal TPN rate is 100 ml/hr (provides 132g protein and 2472 KCal - 100% goal)  Current Nutrition:  NPO  Plan:  Start TPN at 33mL/hr This TPN provides 52.8 g of protein, 144 g of dextrose, and 28.8 g of lipids which provides 988 kCals per day, meeting 40% of patient needs Electrolytes in TPN: standard electrolytes Add MVI, trace elements to TPN Continue SSI and adjust as needed Monitor TPN labs  Brushy, [name] 07/16/2018,8:39 AM

## 2018-07-16 NOTE — Progress Notes (Addendum)
Nutrition Follow-up  DOCUMENTATION CODES:   Obesity unspecified  INTERVENTION:   TPN recommendations  2400-2600 kcal  120-135 grams protein (Monitor magnesium, potassium, and phosphorus daily for at least 3 days, MD to replete as needed, as pt is at risk for refeeding syndrome).   Monitor for diet advancement/toleration or Cortrak placement if diet unable to be advanced.   NUTRITION DIAGNOSIS:   Inadequate oral intake related to poor appetite, nausea, vomiting as evidenced by per patient/family report, NPO status.  Ongoing   GOAL:   Patient will meet greater than or equal to 90% of their needs  Not meeting- To begin TPN  MONITOR:   Diet advancement, I & O's, Labs, Skin  REASON FOR ASSESSMENT:   Consult, Ventilator Enteral/tube feeding initiation and management  ASSESSMENT:   62 year old male who presented with abdominal pain. PMH significant for hypertension, type 2 diabetes mellitus, GERD. 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   10/9 - s/p ex lap, liver biopsy, colectomy with open abdomen and wound VAC 10/11- start trickle feeds Vital HP @ 20 ml/hr, pt vomited, TF stopped 10/13- extubated, to begin TPN  Pt with NGT to suction, RD observed 150 ml drained at bedside. Tube feeding attempted on 10/11 but pt almost immediatly vomited. Post op ileus suspected by surgery (no films taken at this time). RD consulted for initiation of TPN.    Pt complains of some abdominal tightness but this has improved with decompression. No complaints of nausea or vomiting. Pt having high output from colostomy. Pt very eager to eat. If diet is unable to be advanced, recommend placement of Cortrak as soon as ileus symptoms resolve.   Weight noted to decrease from 265 lb on 10/8 to 253 lb today. Likely fluid related with lasix. Will continue  to monitor.   I/O: + 866 ml since admit UOP: 3 L x 24 hrs NGT: 1.4 L x 24 hrs JP drain: 95 ml x 24 hrs  Medications reviewed and include: 40 mg lasix once daily, SSI, NS @ 25 ml/hr Labs reviewed: K 2.9 (L) Creatinine 3.10 (H)   Diet Order:   Diet Order            Diet NPO time specified  Diet effective now              EDUCATION NEEDS:   No education needs have been identified at this time  Skin:  Skin Assessment: Skin Integrity Issues: Skin Integrity Issues:: Wound VAC Wound Vac: open abdomen  Last BM:  10/14- 900 ml colostomy x 24 hrs  Height:   Ht Readings from Last 1 Encounters:  07/19/2018 6' (1.829 m)    Weight:   Wt Readings from Last 1 Encounters:  07/16/18 114.9 kg    Ideal Body Weight:  80.91 kg  BMI:  Body mass index is 34.35 kg/m.  Estimated Nutritional Needs:   Kcal:  2400-2600 kcal   Protein:  120-135 grams  Fluid:  >/= 2 L/day  Mariana Single RD, LDN Clinical Nutrition Pager # - [phone]

## 2018-07-17 DIAGNOSIS — Z978 Presence of other specified devices: Secondary | ICD-10-CM

## 2018-07-17 LAB — CBC
HEMATOCRIT: 27.6 % — AB (ref 39.0–52.0)
Hemoglobin: 8.8 g/dL — ABNORMAL LOW (ref 13.0–17.0)
MCH: 28.3 pg (ref 26.0–34.0)
MCHC: 31.9 g/dL (ref 30.0–36.0)
MCV: 88.7 fL (ref 80.0–100.0)
PLATELETS: 318 10*3/uL (ref 150–400)
RBC: 3.11 MIL/uL — ABNORMAL LOW (ref 4.22–5.81)
RDW: 14 % (ref 11.5–15.5)
WBC: 16.2 10*3/uL — ABNORMAL HIGH (ref 4.0–10.5)
nRBC: 0.1 % (ref 0.0–0.2)

## 2018-07-17 LAB — PHOSPHORUS: Phosphorus: 3.6 mg/dL (ref 2.5–4.6)

## 2018-07-17 LAB — GLUCOSE, CAPILLARY
GLUCOSE-CAPILLARY: 151 mg/dL — AB (ref 70–99)
GLUCOSE-CAPILLARY: 143 mg/dL — AB (ref 70–99)
Glucose-Capillary: 165 mg/dL — ABNORMAL HIGH (ref 70–99)
Glucose-Capillary: 171 mg/dL — ABNORMAL HIGH (ref 70–99)

## 2018-07-17 LAB — DIFFERENTIAL
Abs Immature Granulocytes: 0.61 10*3/uL — ABNORMAL HIGH (ref 0.00–0.07)
Basophils Absolute: 0.1 10*3/uL (ref 0.0–0.1)
Basophils Relative: 0 %
EOS ABS: 0.3 10*3/uL (ref 0.0–0.5)
EOS PCT: 2 %
IMMATURE GRANULOCYTES: 4 %
LYMPHS ABS: 1.6 10*3/uL (ref 0.7–4.0)
LYMPHS PCT: 10 %
MONOS PCT: 12 %
Monocytes Absolute: 2 10*3/uL — ABNORMAL HIGH (ref 0.1–1.0)
Neutro Abs: 11.7 10*3/uL — ABNORMAL HIGH (ref 1.7–7.7)
Neutrophils Relative %: 72 %

## 2018-07-17 LAB — COMPREHENSIVE METABOLIC PANEL
ALT: 40 U/L (ref 0–44)
AST: 73 U/L — ABNORMAL HIGH (ref 15–41)
Albumin: 2.8 g/dL — ABNORMAL LOW (ref 3.5–5.0)
Alkaline Phosphatase: 84 U/L (ref 38–126)
Anion gap: 11 (ref 5–15)
BUN: 61 mg/dL — AB (ref 8–23)
CO2: 22 mmol/L (ref 22–32)
CREATININE: 3.23 mg/dL — AB (ref 0.61–1.24)
Calcium: 9.2 mg/dL (ref 8.9–10.3)
Chloride: 117 mmol/L — ABNORMAL HIGH (ref 98–111)
GFR calc non Af Amer: 19 mL/min — ABNORMAL LOW (ref >60)
GFR, EST AFRICAN AMERICAN: 22 mL/min — AB (ref >60)
Glucose, Bld: 174 mg/dL — ABNORMAL HIGH (ref 70–99)
POTASSIUM: 2.9 mmol/L — AB (ref 3.5–5.1)
SODIUM: 150 mmol/L — AB (ref 135–145)
TOTAL PROTEIN: 7.6 g/dL (ref 6.5–8.1)
Total Bilirubin: 3 mg/dL — ABNORMAL HIGH (ref 0.3–1.2)

## 2018-07-17 LAB — POTASSIUM: Potassium: 3.2 mmol/L — ABNORMAL LOW (ref 3.5–5.1)

## 2018-07-17 LAB — PREALBUMIN: PREALBUMIN: 12.2 mg/dL — AB (ref 18–38)

## 2018-07-17 LAB — MAGNESIUM: MAGNESIUM: 2.6 mg/dL — AB (ref 1.7–2.4)

## 2018-07-17 LAB — TRIGLYCERIDES: Triglycerides: 274 mg/dL — ABNORMAL HIGH (ref <150)

## 2018-07-17 MED ORDER — TRAVASOL 10 % IV SOLN
INTRAVENOUS | Status: AC
  Administered 2018-07-17: 17:00:00 via INTRAVENOUS
  Filled 2018-07-17: qty 528

## 2018-07-17 MED ORDER — DEXTROSE 5 % IV SOLN
INTRAVENOUS | Status: DC
  Administered 2018-07-17 – 2018-07-18 (×3): via INTRAVENOUS

## 2018-07-17 MED ORDER — POTASSIUM CHLORIDE 10 MEQ/50ML IV SOLN
10.0000 meq | INTRAVENOUS | Status: AC
  Administered 2018-07-17 (×6): 10 meq via INTRAVENOUS
  Filled 2018-07-17 (×6): qty 50

## 2018-07-17 MED ORDER — ALBUTEROL SULFATE (2.5 MG/3ML) 0.083% IN NEBU: 2.5000 mg | INHALATION_SOLUTION | RESPIRATORY_TRACT | Status: DC | PRN

## 2018-07-17 MED ORDER — POTASSIUM CHLORIDE 10 MEQ/50ML IV SOLN
10.0000 meq | INTRAVENOUS | Status: AC
  Administered 2018-07-17 (×4): 10 meq via INTRAVENOUS
  Filled 2018-07-17 (×3): qty 50

## 2018-07-17 MED ORDER — DEXTROSE 5 % IV BOLUS
250.0000 mL | Freq: Once | INTRAVENOUS | Status: AC
  Administered 2018-07-17: 250 mL via INTRAVENOUS

## 2018-07-17 MED ORDER — TRAVASOL 10 % IV SOLN: INTRAVENOUS | Status: DC

## 2018-07-17 MED ORDER — MORPHINE SULFATE (PF) 2 MG/ML IV SOLN: 2.0000 mg | INTRAVENOUS | Status: DC | PRN

## 2018-07-17 MED ORDER — ACETAMINOPHEN 650 MG RE SUPP
650.0000 mg | RECTAL | Status: DC | PRN
  Administered 2018-07-20 – 2018-07-23 (×4): 650 mg via RECTAL
  Filled 2018-07-17 (×4): qty 1

## 2018-07-17 MED ORDER — MORPHINE SULFATE (PF) 2 MG/ML IV SOLN
1.0000 mg | INTRAVENOUS | Status: DC | PRN
  Administered 2018-07-18 (×3): 2 mg via INTRAVENOUS
  Filled 2018-07-17 (×3): qty 1

## 2018-07-17 NOTE — Care Management Note (Signed)
Case Management Note  Patient Details  Name: Jerry Green MRN: [id number] Date of Birth: 05/22/1956  Subjective/Objective:  62 y.o. male transferred from OSH with worsening RLQ abdominal pain. S/p exp. Lap and liver biopsy s/p colectomy and open abd with wound vac intact. PMH: HTN, DM type 2, and Gerd               Action/Plan: CM met with patient to discuss transitional needs and therapy recommendations for CIR. Patient stated living a home alone, independent with ADLs with no DME in use PTA. Patient indicated his daughter, Layken Doenges, lives nearby and helps with his finances. PCP confirmed as: Dr. Brooke Bonito. CM discussed CIR, with patient agreeable and very motivated to work towards his independence; patient verbalized to CM he's very eager to eat again. CM will continue to follow.    Expected Discharge Date:                  Expected Discharge Plan:  [place]  In-House Referral:  NA  Discharge planning Services  CM Consult  Post Acute Care Choice:  IP Rehab Choice offered to:  Patient  DME Arranged:  N/A DME Agency:  NA  HH Arranged:  NA HH Agency:  NA  Status of Service:  In process, will continue to follow  If discussed at Long Length of Stay Meetings, dates discussed:    Additional Comments:  Midge Minium RN, BSN, NCM-BC, ACM-RN [phone] 07/17/2018, 3:44 PM

## 2018-07-17 NOTE — Progress Notes (Signed)
6 Days Post-Op    CC: perforated colon  Subjective: He looks pretty comfortable in bed. Has been OOB at least once yesterday. Some sweat in the bag and one solid piece of stool.  Drain is serosanguinous, I looked at the wound yesterday and it looks good, Wound vac back on.    Objective: Vital signs in last 24 hours: Temp:  [97.9 F (36.6 C)-98.9 F (37.2 C)] 98.9 F (37.2 C) (10/15 0000) Pulse Rate:  [76-105] 84 (10/15 0800) Resp:  [17-44] 39 (10/15 0800) BP: (131-155)/(70-98) 140/80 (10/15 0800) SpO2:  [94 %-97 %] 96 % (10/15 0800) Weight:  [111.8 kg] 111.8 kg (10/15 0353) Last BM Date: 07/16/18 922 IV Urine 1625 900 NG Drain 70 Stool 275 Afebrile, VSS Na 150, K+2.9 Glucose 174 Bilirubin 3.0 Creatinine still in the 3 range Prealbumin 12.2 WBC 16.2  H/H 8.8/27.6   Intake/Output from previous day: 10/14 0701 - 10/15 0700 In: 922.9 [I.V.:475.5; IV Piggyback:447.5] Out: 2870 [Urine:1625; Emesis/NG output:900; Drains:70; Stool:275] Intake/Output this shift: Total I/O In: 130.4 [I.V.:79.9; IV Piggyback:50.4] Out: -   General appearance: alert, cooperative and no distress Resp: clear to auscultation bilaterally GI: large abdomen, rare BS, some fluid mostly clear in the ostomybag with some air.  ostomy looks fine.   Lab Results:  Recent Labs    07/16/18 0410 07/17/18 0346  WBC 14.9* 16.2*  HGB 8.3* 8.8*  HCT 26.3* 27.6*  PLT 258 318    BMET Recent Labs    07/16/18 0410 07/17/18 0346  NA 145 150*  K 2.9* 2.9*  CL 111 117*  CO2 20* 22  GLUCOSE 114* 174*  BUN 51* 61*  CREATININE 3.10* 3.23*  CALCIUM 9.0 9.2   PT/INR No results for input(s): LABPROT, INR in the last 72 hours.  Recent Labs  Lab 07/16/2018 1402 07/12/18 0340 07/13/18 0440 07/14/18 0432 07/17/18 0346  AST  --   --  49* 49* 73*  ALT  --   --  50* 37 40  ALKPHOS  --   --  50 61 84  BILITOT  --   --  1.1 0.8 3.0*  PROT  --   --  6.3* 6.3* 7.6  ALBUMIN 2.9* 3.0* 2.5* 2.1* 2.8*      Lipase  No results found for: LIPASE   Medications: . sodium chloride   Intravenous Once  . sodium chloride   Intravenous Once  . enoxaparin (LOVENOX) injection  40 mg Subcutaneous Q24H  . insulin aspart  0-9 Units Subcutaneous Q4H   1.Colon, segmental resection for tumor, Left descending - INVASIVE ADENOCARCINOMA, 7.8 CM. - TUMOR DIRECTLY INVADES THE ATTACHED OMENTUM. - THE RADIAL MARGIN IS INVOLVED BY TUMOR. - THE PROXIMAL AND DISTAL MARGINS ARE NOT INVOLVED. - METASTATIC CARCINOMA IS PRESENT IN (3) OF (17) LYMPH NODES. - SEE ONCOLOGY TABLE. 2. Liver, biopsy - METASTATIC ADENOCARCINOMA.  Anti-infectives (From admission, onward)   Start     Dose/Rate Route Frequency Ordered Stop   07/09/2018 1800  piperacillin-tazobactam (ZOSYN) IVPB 3.375 g     3.375 g 12.5 mL/hr over 240 Minutes Intravenous Every 8 hours 07/05/2018 1444     07/17/2018 0915  cefoTEtan (CEFOTAN) 2 g in sodium chloride 0.9 % 100 mL IVPB     2 g 200 mL/hr over 30 Minutes Intravenous To Short Stay 07/19/2018 0857 07/17/2018 1900   07/24/2018 0906  cefoTEtan in Dextrose 5% (CEFOTAN) 2-2.08 GM-%(50ML) IVPB    Note to Pharmacy:  [name]   : cabinet override  07/29/2018 0906 07/13/2018 2114   07/14/2018 1200  piperacillin-tazobactam (ZOSYN) IVPB 3.375 g  Status:  Discontinued     3.375 g 12.5 mL/hr over 240 Minutes Intravenous Every 8 hours 07/07/2018 0615 08/02/2018 1341   07/16/2018 0730  cefoTEtan (CEFOTAN) 2 g in sodium chloride 0.9 % 100 mL IVPB  Status:  Discontinued     2 g 200 mL/hr over 30 Minutes Intravenous On call to O.R. 07/20/2018 0723 07/25/2018 0559   07/29/2018 0615  piperacillin-tazobactam (ZOSYN) IVPB 3.375 g     3.375 g 100 mL/hr over 30 Minutes Intravenous  Once 07/08/2018 0612 07/26/2018 1943      . sodium chloride Stopped (07/16/18 1523)  . piperacillin-tazobactam (ZOSYN)  IV Stopped (07/17/18 0520)  . potassium chloride 50 mL/hr at 07/17/18 0800  . TPN ADULT (ION) 40 mL/hr at 07/17/18 0800  . 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 Rolm Bookbinder POD#[id number]  FEN:  NPO/ IV fluids/TNA being started ID:  Zosyn 10/8 =>> day 8;  Cefotetan pre op DVT:  SCD/Lovenox to restart later today Medicine: Dr. Evalee Mutton Ghimire/CCM:  Dr. Darlys Gales Foley:  In Follow up:  Dr. Donne Hazel   Plan:  Started on TNA, await bowel function to return.  Medical management per CCM.       LOS: 7 days    Jacorey Donaway 07/17/2018 [phone]

## 2018-07-17 NOTE — Progress Notes (Signed)
[place] TEAM 1 - Stepdown/ICU TEAM  Jerry Green  JSR:[id number] DOB: 04-Apr-1956 DOA: 07/08/2018 PCP: Ollen Bowl, MD    Brief Narrative:  62 yo M with hx of HTN and DM2 who was transferred from an OSH with 2-3 days of abd pain w/ N/V. He was found to have an acuteperforated obstructing large carcinoma of the splenic flexure of the colon with suspectedlymphatic metastases, peritoneal carcinomatosis, liver metastases, and invasion of the left kidney. He was taken to the OR 10/9 for ex lap and liver biopsy s/p colectomy and open abd with wound vac.  Significant Events: 10/8 transfer to [facility] from OSH 10/9 ex lap, left colectomy, biopsy of liver lesion 10/14 PICC placed 10/15 TNA initiated - TRH assumed care   Subjective: Sitting up in bedside chair. Desires more ice chips. Denies signif abdom pain, cp, n/v, or sob. Is alert and oriented.   Assessment & Plan:  Acute Respiratory Failure in post operative setting Now off vent and doing well on minimal O2 support   Acute perforation due to large obstructing mass of the splenic flexure with lymphatic metastases peritoneal carcinomatosis and liver mets - Metastatic Adenocarcinoma of colon Post-op care per CCS - Oncology now following - plan is to initiate chemotherapy in the outpt setting per Dr. Benay Spice   Hypokalemia  Mg is normal - now on TNA - Pharmacy addressing w/ f/u BMET today   Hypernatremia Increase free water and trend   Nutrition  TNA dependent at present - diet per CCS  Acute kidney injury  Hydrate and follow - avoid potential renal insults   HTN BP reasonably controlled for this clinical situation - follow w/o change today  DM2 CBG reasonably controlled at this time   DVT prophylaxis: lovenox  Code Status: FULL CODE Family Communication: no family present at time of exam  Disposition Plan: SDU  Consultants:  CCS PCCM Oncology   Antimicrobials:  Zosyn 10/7 >  Objective: Blood pressure 140/80,  pulse 84, temperature 98.9 F (37.2 C), temperature source Oral, resp. rate (!) 39, height 6' (1.829 m), weight 111.8 kg, SpO2 96 %.  Intake/Output Summary (Last 24 hours) at 07/17/2018 0957 Last data filed at 07/17/2018 0800 Gross per 24 hour  Intake 989.66 ml  Output 2620 ml  Net -1630.34 ml   Filed Weights   07/15/18 0259 07/16/18 0600 07/17/18 0353  Weight: 120 kg 114.9 kg 111.8 kg    Examination: General: No acute respiratory distress Lungs: Clear to auscultation bilaterally without wheezes or crackles Cardiovascular: Regular rate and rhythm without murmur gallop or rub normal S1 and S2 Abdomen: midline wound w/ VAC in place - ostomy in R mid abdom - no rebound, no mass, soft  Extremities: No significant cyanosis, clubbing, or edema bilateral lower extremities  CBC: Recent Labs  Lab 07/15/18 0232 07/15/18 1729 07/16/18 0410 07/17/18 0346  WBC 13.5*  --  14.9* 16.2*  NEUTROABS 10.2*  --  11.3* 11.7*  HGB 7.2* 8.4* 8.3* 8.8*  HCT 22.8* 26.2* 26.3* 27.6*  MCV 89.8  --  88.9 88.7  PLT 324  --  258 924   Basic Metabolic Panel: Recent Labs  Lab 07/29/2018 1402 07/12/18 0340  07/14/18 0432 07/15/18 0232 07/16/18 0410 07/17/18 0346  NA 136 137   < > 140 143 145 150*  K 4.6 4.9   < > 3.7 3.7 2.9* 2.9*  CL 104 103   < > 107 110 111 117*  CO2 21* 22   < >  22 20* 20* 22  GLUCOSE 208* 179*   < > 137* 116* 114* 174*  BUN 16 24*   < > 32* 38* 51* 61*  CREATININE 1.61* 3.42*   < > 2.32* 2.55* 3.10* 3.23*  CALCIUM 8.4* 8.2*   < > 8.5* 8.5* 9.0 9.2  MG 1.7  --   --  2.2  --   --  2.6*  PHOS 4.3 4.7*  --   --   --   --  3.6   < > = values in this interval not displayed.   GFR: Estimated Creatinine Clearance: 30.2 mL/min (A) (by C-G formula based on SCr of 3.23 mg/dL (H)).  Liver Function Tests: Recent Labs  Lab 07/12/18 0340 07/13/18 0440 07/14/18 0432 07/17/18 0346  AST  --  49* 49* 73*  ALT  --  50* 37 40  ALKPHOS  --  50 61 84  BILITOT  --  1.1 0.8 3.0*  PROT   --  6.3* 6.3* 7.6  ALBUMIN 3.0* 2.5* 2.1* 2.8*    Coagulation Profile: Recent Labs  Lab 07/27/2018 1402  INR 1.23   HbA1C: Hgb A1c MFr Bld  Date/Time Value Ref Range Status  07/14/2018 04:32 AM 7.4 (H) 4.8 - 5.6 % Final    Comment:    (NOTE) Pre diabetes:          5.7%-6.4% Diabetes:              >6.4% Glycemic control for   <7.0% adults with diabetes   07/22/2018 05:53 AM 7.5 (H) 4.8 - 5.6 % Final    Comment:    (NOTE) Pre diabetes:          5.7%-6.4% Diabetes:              >6.4% Glycemic control for   <7.0% adults with diabetes     CBG: Recent Labs  Lab 07/16/18 1558 07/16/18 1936 07/16/18 2311 07/17/18 0334 07/17/18 0739  GLUCAP 111* 122* 145* 151* 165*    Recent Results (from the past 240 hour(s))  MRSA PCR Screening     Status: None   Collection Time: 07/07/2018  5:51 AM  Result Value Ref Range Status   MRSA by PCR NEGATIVE NEGATIVE Final    Comment:        The GeneXpert MRSA Assay (FDA approved for NASAL specimens only), is one component of a comprehensive MRSA colonization surveillance program. It is not intended to diagnose MRSA infection nor to guide or monitor treatment for MRSA infections. Performed at [facility], [place] [address], [place], [place] [address]      Scheduled Meds: . sodium chloride   Intravenous Once  . sodium chloride   Intravenous Once  . enoxaparin (LOVENOX) injection  40 mg Subcutaneous Q24H  . insulin aspart  0-9 Units Subcutaneous Q4H   Continuous Infusions: . sodium chloride Stopped (07/16/18 1523)  . piperacillin-tazobactam (ZOSYN)  IV Stopped (07/17/18 0520)  . potassium chloride 10 mEq (07/17/18 0845)  . TPN ADULT (ION) 40 mL/hr at 07/17/18 0800  . TPN ADULT (ION)       LOS: 7 days   Cherene Altes, MD [facility] Office  [phone] Pager - Text Page per Amion  If 7PM-7AM, please contact night-coverage per Amion 07/17/2018, 9:57 AM

## 2018-07-17 NOTE — Progress Notes (Signed)
Physical Therapy Treatment Patient Details Name: Keiandre Cygan MRN: [id number] DOB: 11/12/1955 Today's Date: 07/17/2018    History of Present Illness  Hicks Feick is a 62 y.o. male with medical history significant of HTN, DM type 2, and Gerd; who initially presented to [facility] with complaints of progressively worsening right lower quadrant abdominal pain over the last 3-4 days. 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  Taken to OR 10/9 for ex lap and liver biopsy s/p colectomy and open abd with wound vac.    PT Comments    Pt received in recliner, agreeable to participation in therapy. He required +2 mod assist sit to stand, SPT with RW and sit to supine. Increased time required to stabilize standing balance.  Pt demonstrated notable fatigue upon return to bed. He is very motivated to participate in therapy and regain his independence.   Follow Up Recommendations  CIR     Equipment Recommendations  None recommended by PT(TBD)    Recommendations for Other Services       Precautions / Restrictions Precautions Precautions: Fall;Other (comment) Precaution Comments: wound vac and jp at abdomen, ng Restrictions Weight Bearing Restrictions: No    Mobility  Bed Mobility Overal bed mobility: Needs Assistance Bed Mobility: Sit to Supine       Sit to supine: Mod assist;+2 for physical assistance   General bed mobility comments: cues for sequencing, assist to control trunk and lift BLE in bed  Transfers Overall transfer level: Needs assistance Equipment used: Rolling walker (2 wheeled) Transfers: Sit to/from [facility] Sit to Stand: Mod assist;+2 physical assistance Stand pivot transfers: Mod assist;+2  physical assistance       General transfer comment: Retropulsive in stance, requiring cues and increased time to stabilize balance. Pt able to perform SPT with RW.  Ambulation/Gait                 Stairs             Wheelchair Mobility    Modified Rankin (Stroke Patients Only)       Balance Overall balance assessment: Needs assistance Sitting-balance support: Feet supported;Bilateral upper extremity supported Sitting balance-Leahy Scale: Poor     Standing balance support: Bilateral upper extremity supported;During functional activity Standing balance-Leahy Scale: Poor Standing balance comment: dependent on RW and physical assist                            Cognition Arousal/Alertness: Awake/alert Behavior During Therapy: WFL for tasks assessed/performed Overall Cognitive Status: Within Functional Limits for tasks assessed                                        Exercises      General Comments        Pertinent Vitals/Pain Pain Assessment: No/denies pain    Home Living Family/patient expects to be discharged to:: Inpatient rehab Living Arrangements: Children Available Help at Discharge: Family;Available PRN/intermittently Type of Home: House       Home Equipment: None Additional Comments: says he can go live with his dtr, but she does work    Prior Function Level of Independence:  Independent      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   PT Goals (current goals can now be found in the care plan section) Acute Rehab PT Goals Patient Stated Goal: get stronger PT Goal Formulation: With patient Time For Goal Achievement: 07/30/18 Potential to Achieve Goals: Good Progress towards PT goals: Progressing toward goals    Frequency    Min 4X/week      PT Plan Current plan remains appropriate    Co-evaluation              AM-PAC PT "6 Clicks" Daily Activity  Outcome Measure  Difficulty turning over in  bed (including adjusting bedclothes, sheets and blankets)?: Unable Difficulty moving from lying on back to sitting on the side of the bed? : Unable Difficulty sitting down on and standing up from a chair with arms (e.g., wheelchair, bedside commode, etc,.)?: Unable Help needed moving to and from a bed to chair (including a wheelchair)?: A Lot Help needed walking in hospital room?: A Lot Help needed climbing 3-5 steps with a railing? : A Lot 6 Click Score: 9    End of Session Equipment Utilized During Treatment: Gait belt Activity Tolerance: Patient limited by fatigue Patient left: in bed;with call bell/phone within reach Nurse Communication: Mobility status PT Visit Diagnosis: Unsteadiness on feet (R26.81)     Time: 9791-5041 PT Time Calculation (min) (ACUTE ONLY): 24 min  Charges:  $Therapeutic Activity: 23-37 mins                     Lorrin Goodell, PT  Office # [phone] Pager [phone]    Lorriane Shire 07/17/2018, 12:48 PM

## 2018-07-17 NOTE — Evaluation (Signed)
Occupational Therapy Evaluation Patient Details Name: Jerry Green MRN: [id number] DOB: Feb 19, 1956 Today's Date: 07/17/2018    History of Present Illness  Jerry Green is a 62 y.o. male with medical history significant of HTN, DM type 2, and Gerd; who initially presented to [facility] with complaints of progressively worsening right lower quadrant abdominal pain over the last 3-4 days. 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  Taken to OR 10/9 for ex lap and liver biopsy s/p colectomy and open abd with wound vac.   Clinical Impression   This 62 yo male admitted and underwent above presents to acute OT with decreased balance (sitting and standing), decreased mobility, and generalized weakness thus affecting his PLOF of being totally independent with all basic and IADLs. Pt is currently total A-setup/S level for basic ADLs. He will benefit from acute OT with follow up OT on CIR to get back to PLOF.    Follow Up Recommendations  SNF;Supervision/Assistance - 24 hour    Equipment Recommendations  Other (comment)(TBD at next venue)       Precautions / Restrictions Precautions Precautions: Fall Precaution Comments: wound vac and jp at abdomen, ng Restrictions Weight Bearing Restrictions: No      Mobility Bed Mobility               General bed mobility comments: Pt sitting on EOB with RN and NT when I entered  Transfers Overall transfer level: Needs assistance Equipment used: Rolling walker (2 wheeled) Transfers: Sit to/from [facility] Sit to Stand: Mod assist;+2 physical assistance Stand pivot transfers: Mod assist;+2 physical assistance       General transfer comment: Pt needed cues and increased time to  stand up tall    Balance Overall balance assessment: Needs assistance Sitting-balance support: Feet supported;Bilateral upper extremity supported Sitting balance-Leahy Scale: Poor     Standing balance support: Bilateral upper extremity supported;During functional activity Standing balance-Leahy Scale: Poor Standing balance comment: dependent on RW and physical assist                           ADL either performed or assessed with clinical judgement   ADL Overall ADL's : Needs assistance/impaired Eating/Feeding: Independent Eating/Feeding Details (indicate cue type and reason): with ice chips, otherwise currently NPO Grooming: Set up;Supervision/safety Grooming Details (indicate cue type and reason): supported sitting Upper Body Bathing: Minimal assistance Upper Body Bathing Details (indicate cue type and reason): supported sitting Lower Body Bathing: Maximal assistance Lower Body Bathing Details (indicate cue type and reason): Mod A +2 sit<>stand Upper Body Dressing : Minimal assistance Upper Body Dressing Details (indicate cue type and reason): supported sitting Lower Body Dressing: Total assistance Lower Body Dressing Details (indicate cue type and reason): Mod A +2 sit<>stand, pt can cross his LLE get doff sock (but can't donn), cannot cross RLE to doff/donn sock Toilet Transfer: Moderate assistance;+2 for physical assistance;Stand-pivot Toilet Transfer Details (indicate cue type and reason): bed>recliner Toileting- Clothing Manipulation and Hygiene: Total assistance Toileting - Clothing Manipulation Details (indicate cue type and reason): Mod A +2 sit<>stand             Vision Patient Visual Report: No change from baseline  Pertinent Vitals/Pain Pain Assessment: No/denies pain     Hand Dominance Right   Extremity/Trunk Assessment Upper Extremity Assessment Upper Extremity Assessment: Generalized weakness(mainly at shoulders)            Communication Communication Communication: No difficulties   Cognition Arousal/Alertness: Awake/alert Behavior During Therapy: WFL for tasks assessed/performed Overall Cognitive Status: Within Functional Limits for tasks assessed                                                Home Living Family/patient expects to be discharged to:: Inpatient rehab Living Arrangements: Children Available Help at Discharge: Family;Available PRN/intermittently Type of Home: House             Bathroom Shower/Tub: Walk-in shower;Door   [employer] Toilet: Standard     Home Equipment: None   Additional Comments: says he can go live with his dtr, but she does work      Prior Functioning/Environment Level of Independence: Independent        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        OT Problem List: Decreased strength;Decreased range of motion;Impaired balance (sitting and/or standing);Decreased knowledge of use of DME or AE      OT Treatment/Interventions: Self-care/ADL training;Balance training;DME and/or AE instruction;Patient/family education;Therapeutic activities    OT Goals(Current goals can be found in the care plan section) Acute Rehab OT Goals Patient Stated Goal: to get stronger and be able to go home and enjoy life OT Goal Formulation: With patient Time For Goal Achievement: 07/31/18 Potential to Achieve Goals: Good  OT Frequency: Min 3X/week              AM-PAC PT "6 Clicks" Daily Activity     Outcome Measure Help from another person eating meals?: None Help from another person taking care of personal grooming?: A Little Help from another person toileting, which includes using toliet, bedpan, or urinal?: Total Help from another person bathing (including washing, rinsing, drying)?: A Lot Help from another person to put on and taking off regular upper body clothing?: A Lot Help from another person to put on and taking off regular lower body  clothing?: Total 6 Click Score: 13   End of Session Equipment Utilized During Treatment: Rolling walker Nurse Communication: (RN and NT A'd with transfer)  Activity Tolerance: Patient tolerated treatment well Patient left: in chair;with call bell/phone within reach(RN had set up recliner (no alarm))  OT Visit Diagnosis: Unsteadiness on feet (R26.81);Other abnormalities of gait and mobility (R26.89);Muscle weakness (generalized) (M62.81)                Time: 1275-1700 OT Time Calculation (min): 24 min Charges:  OT General Charges $OT Visit: 1 Visit OT Evaluation $OT Eval Moderate Complexity: 1 Mod OT Treatments $Self Care/Home Management : 8-22 mins [place], OTR/L Acute [employer] Pager [phone] Office [phone]

## 2018-07-17 NOTE — Progress Notes (Signed)
[place] CONSULT NOTE   Pharmacy Consult for TPN Indication: prolonged ileus  Patient Measurements: Height: 6' (182.9 cm) Weight: 246 lb 7.6 oz (111.8 kg) IBW/kg (Calculated) : 77.6 TPN AdjBW (KG): 88.3 Body mass index is 33.43 kg/m.  Assessment:  Jerry Green transferred from OSH with abdominal pain, N/V and found to have an acute perforated/obstructing large carcinoma in the colon with multiple metastases. S/p OR 10/9 for exlap and liver biopsy with colectomy. Attempted TF but with almost immediate vomiting. To start TPN today for nutrition support. Pt is at risk for refeeding.   GI: s/p colectomy with going ileus - drain OP 70, NG OP 900 Endo: Hx DM - CBGs currently well controlled Insulin requirements in the past 24 hours: 4 units Lytes: K 2.9 (supplementing with 6 runs K per MD), Mg 2.6, Phos WNL, Corr Ca 10.2, Na 150, Cl 117 Renal: Scr trending up to 3.23, UOP down to 0.6 Pulm: RA Cards: VSS Hepatobil: AST up slightly, albumin 2.8, prealbumin 13.2>>12.2, TG 274 Neuro: A&O x 4 ID: Zosyn postop for gross contamination with stool perforated colon - Afebrile, WBC 16.2  TPN Access: CVC TPN start date: 10/14>> Nutritional Goals (per RD recommendation on 10/14): KCal: 2400-2600 KCal Protein: 120-135 g  Fluid: >/= 2L/day  Goal TPN rate is 100 ml/hr (provides 132g protein and 2472 KCal - 100% goal)  Current Nutrition:  NPO TPN at 40 mL/hr  Plan:  Continue TPN at 46mL/hr - will not advance yet until electrolytes are better controlled due to risk of refeeding This TPN provides 52.8 g of protein, 144 g of dextrose, and 28.8 g of lipids which provides 988 kCals per day, meeting 40% of patient needs Electrolytes in TPN: Increase potassium to 40meq/L, reduce Mg to 62meq/L, reduce Na to 21meq/L, maximize acetate Recheck potassium level this afternoon Add MVI, trace elements to TPN DC famotidine and add 20mg  to TPN Continue SSI and adjust as  needed Monitor TPN labs  Torah Pinnock, [name] 07/17/2018,7:33 AM

## 2018-07-17 NOTE — Progress Notes (Signed)
[place] Progress Note Patient Name: Jerry Green DOB: [birth year] MRN: [id number]   Date of Service  07/17/2018  HPI/Events of Note  K+ = 2.9 and Creatinine = 3.23.   eICU Interventions  Will replace K+.       Intervention Category Major Interventions: Electrolyte abnormality - evaluation and management  [name] 07/17/2018, 5:15 AM

## 2018-07-18 DIAGNOSIS — C183 Malignant neoplasm of hepatic flexure: Secondary | ICD-10-CM

## 2018-07-18 DIAGNOSIS — K560 Paralytic ileus: Secondary | ICD-10-CM

## 2018-07-18 LAB — BASIC METABOLIC PANEL
Anion gap: 12 (ref 5–15)
Anion gap: 13 (ref 5–15)
BUN: 65 mg/dL — AB (ref 8–23)
BUN: 67 mg/dL — ABNORMAL HIGH (ref 8–23)
CALCIUM: 9.5 mg/dL (ref 8.9–10.3)
CHLORIDE: 118 mmol/L — AB (ref 98–111)
CO2: 21 mmol/L — ABNORMAL LOW (ref 22–32)
CO2: 22 mmol/L (ref 22–32)
CREATININE: 3.06 mg/dL — AB (ref 0.61–1.24)
Calcium: 9.6 mg/dL (ref 8.9–10.3)
Chloride: 113 mmol/L — ABNORMAL HIGH (ref 98–111)
Creatinine, Ser: 3.26 mg/dL — ABNORMAL HIGH (ref 0.61–1.24)
GFR calc Af Amer: 23 mL/min — ABNORMAL LOW (ref >60)
GFR calc Af Amer: 22 mL/min — ABNORMAL LOW (ref >60)
GFR calc non Af Amer: 19 mL/min — ABNORMAL LOW (ref >60)
GFR, EST NON AFRICAN AMERICAN: 20 mL/min — AB (ref >60)
GLUCOSE: 189 mg/dL — AB (ref 70–99)
Glucose, Bld: 669 mg/dL (ref 70–99)
Potassium: 6.4 mmol/L (ref 3.5–5.1)
Potassium: 3.4 mmol/L — ABNORMAL LOW (ref 3.5–5.1)
SODIUM: 146 mmol/L — AB (ref 135–145)
Sodium: 153 mmol/L — ABNORMAL HIGH (ref 135–145)

## 2018-07-18 LAB — GLUCOSE, CAPILLARY
GLUCOSE-CAPILLARY: 179 mg/dL — AB (ref 70–99)
GLUCOSE-CAPILLARY: 161 mg/dL — AB (ref 70–99)
Glucose-Capillary: 154 mg/dL — ABNORMAL HIGH (ref 70–99)
Glucose-Capillary: 165 mg/dL — ABNORMAL HIGH (ref 70–99)
Glucose-Capillary: 174 mg/dL — ABNORMAL HIGH (ref 70–99)
Glucose-Capillary: 203 mg/dL — ABNORMAL HIGH (ref 70–99)
Glucose-Capillary: 153 mg/dL — ABNORMAL HIGH (ref 70–99)

## 2018-07-18 LAB — PHOSPHORUS
Phosphorus: 5.5 mg/dL — ABNORMAL HIGH (ref 2.5–4.6)
Phosphorus: 3.9 mg/dL (ref 2.5–4.6)

## 2018-07-18 LAB — MAGNESIUM
MAGNESIUM: 2.8 mg/dL — AB (ref 1.7–2.4)
Magnesium: 3 mg/dL — ABNORMAL HIGH (ref 1.7–2.4)

## 2018-07-18 MED ORDER — TRAVASOL 10 % IV SOLN
INTRAVENOUS | Status: AC
  Administered 2018-07-18: 18:00:00 via INTRAVENOUS
  Filled 2018-07-18: qty 858

## 2018-07-18 MED ORDER — INSULIN ASPART 100 UNIT/ML ~~LOC~~ SOLN
0.0000 [IU] | SUBCUTANEOUS | Status: DC
  Administered 2018-07-18 (×2): 4 [IU] via SUBCUTANEOUS
  Administered 2018-07-18 – 2018-07-19 (×7): 7 [IU] via SUBCUTANEOUS
  Administered 2018-07-20 (×5): 11 [IU] via SUBCUTANEOUS
  Administered 2018-07-20: 15 [IU] via SUBCUTANEOUS
  Administered 2018-07-21 (×2): 7 [IU] via SUBCUTANEOUS
  Administered 2018-07-21 (×5): 11 [IU] via SUBCUTANEOUS
  Administered 2018-07-22: 15 [IU] via SUBCUTANEOUS
  Administered 2018-07-22: 11 [IU] via SUBCUTANEOUS
  Administered 2018-07-22: 7 [IU] via SUBCUTANEOUS
  Administered 2018-07-22: 15 [IU] via SUBCUTANEOUS
  Administered 2018-07-22 – 2018-07-23 (×5): 7 [IU] via SUBCUTANEOUS
  Administered 2018-07-23: 11 [IU] via SUBCUTANEOUS
  Administered 2018-07-23 – 2018-07-24 (×4): 7 [IU] via SUBCUTANEOUS
  Administered 2018-07-24: 11 [IU] via SUBCUTANEOUS
  Administered 2018-07-24 – 2018-07-25 (×3): 7 [IU] via SUBCUTANEOUS
  Administered 2018-07-25: 11 [IU] via SUBCUTANEOUS
  Administered 2018-07-25: 7 [IU] via SUBCUTANEOUS
  Administered 2018-07-25: 4 [IU] via SUBCUTANEOUS

## 2018-07-18 MED ORDER — POTASSIUM CHLORIDE 10 MEQ/50ML IV SOLN
10.0000 meq | INTRAVENOUS | Status: AC
  Administered 2018-07-18 (×4): 10 meq via INTRAVENOUS
  Filled 2018-07-18: qty 50

## 2018-07-18 MED ORDER — HEPARIN SODIUM (PORCINE) 5000 UNIT/ML IJ SOLN
5000.0000 [IU] | Freq: Three times a day (TID) | INTRAMUSCULAR | Status: DC
  Administered 2018-07-18 – 2018-07-20 (×6): 5000 [IU] via SUBCUTANEOUS
  Filled 2018-07-18 (×7): qty 1

## 2018-07-18 NOTE — Progress Notes (Addendum)
Physical Therapy Treatment Patient Details Name: Jerry Green MRN: [id number] DOB: Aug 17, 1956 Today's Date: 07/18/2018    History of Present Illness  Jerry Green is a 62 y.o. male with medical history significant of HTN, DM type 2, and Gerd; who initially presented to [facility] with complaints of progressively worsening right lower quadrant abdominal pain over the last 3-4 days. 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  Taken to OR 10/9 for ex lap and liver biopsy s/p colectomy and open abd with wound vac.    PT Comments    Patient continues to display great motivation and participation in therapy. Requested written HEP which he could perform independently; PT to provide on next visit. Patient currently requiring two person moderate assistance for transfers with walker. Rest of session focused on bed level therapeutic exercises for strengthening. Will continue to progress transfer and gait training as tolerated. Continue to recommend comprehensive inpatient rehab (CIR) for post-acute therapy needs.     Follow Up Recommendations  CIR     Equipment Recommendations  None recommended by PT(TBD)    Recommendations for Other Services       Precautions / Restrictions Precautions Precautions: Fall;Other (comment) Precaution Comments: wound vac and jp at abdomen, ng Restrictions Weight Bearing Restrictions: No    Mobility  Bed Mobility Overal bed mobility: Needs Assistance Bed Mobility: Supine to Sit;Sit to Supine     Supine to sit: Min assist Sit to supine: Max assist   General bed mobility comments: Requiring min assist for supine > sit with use of bed pad to scoot right hip forward. Secondary to fatigue, patient requiring maxA  for sit > supine with assistance for elevating BLE's and lowering trunk  Transfers Overall transfer level: Needs assistance Equipment used: Rolling walker (2 wheeled) Transfers: Sit to/from Stand Sit to Stand: Mod assist;+2 physical assistance         General transfer comment: Heavy modA + 2 to boost up to stand from elevated bed surface. Cues for upright posture and hip extension. Patient fatiguing quickly, but able to stand long enough to change bed linens.   Ambulation/Gait                 Stairs             Wheelchair Mobility    Modified Rankin (Stroke Patients Only)       Balance Overall balance assessment: Needs assistance Sitting-balance support: Feet supported;Bilateral upper extremity supported Sitting balance-Leahy Scale: Fair     Standing balance support: Bilateral upper extremity supported;During functional activity Standing balance-Leahy Scale: Poor                              Cognition Arousal/Alertness: Awake/alert Behavior During Therapy: WFL for tasks assessed/performed Overall Cognitive Status: Within Functional Limits for tasks assessed                                        Exercises General Exercises - Lower Extremity Long Arc Quad: 10 reps;Both;Supine Heel Slides: 15 reps;Both;Supine Straight Leg Raises: 10 reps;Both;Supine    General Comments General comments (skin integrity, edema, etc.): Prior to mobility:  100 bpm, 94% SpO2 on RA, 141/94      Pertinent Vitals/Pain Pain Assessment: No/denies pain    Home Living                      Prior Function            PT Goals (current goals can now be found in the care plan section) Acute Rehab PT Goals Patient Stated Goal: get stronger Potential to Achieve Goals: Good Progress towards PT goals: Progressing toward goals    Frequency    Min 4X/week      PT Plan Current plan remains appropriate    Co-evaluation               AM-PAC PT "6 Clicks" Daily Activity  Outcome Measure  Difficulty turning over in bed (including adjusting bedclothes, sheets and blankets)?: Unable Difficulty moving from lying on back to sitting on the side of the bed? : Unable Difficulty sitting down on and standing up from a chair with arms (e.g., wheelchair, bedside commode, etc,.)?: Unable Help needed moving to and from a bed to chair (including a wheelchair)?: A Lot Help needed walking in hospital room?: A Lot Help needed climbing 3-5 steps with a railing? : Total 6 Click Score: 8    End of Session Equipment Utilized During Treatment: Gait belt Activity Tolerance: Patient tolerated treatment well Patient left: in bed;with call bell/phone within reach Nurse Communication: Mobility status PT Visit Diagnosis: Unsteadiness on feet (R26.81);Difficulty in walking, not elsewhere classified (R26.2);Muscle weakness (generalized) (M62.81)     Time: 3016-0109 PT Time Calculation (min) (ACUTE ONLY): 37 min  Charges:  $Therapeutic Exercise: 8-22 mins $Therapeutic Activity: 8-22 mins           Ellamae Sia, PT, DPT Acute Rehabilitation Services Pager [phone] Office [phone]    Willy Eddy 07/18/2018, 3:23 PM

## 2018-07-18 NOTE — Progress Notes (Addendum)
7 Days Post-Op    CC:  perforated colon  Subjective: Ongoing ileus, but slowly improving.  He has a few high pitched BS this AM.  Midline wound vac in place, still 650 from the NG.  Drain is serosanguinous. Drainage from the ostomy is showing some increase is solid fecal matter, not a lot but more than yesterday.  He says he was OOB last PM.   IS out of reach, he says he did it about 3 times yesterday, he can move about 500, I gave him a new goal of 1000.  Objective: Vital signs in last 24 hours: Temp:  [98.2 F (36.8 C)-98.5 F (36.9 C)] 98.2 F (36.8 C) (10/16 0400) Pulse Rate:  [77-97] 84 (10/16 0700) Resp:  [21-46] 40 (10/16 0700) BP: (129-188)/(80-149) 129/82 (10/16 0700) SpO2:  [91 %-100 %] 95 % (10/16 0700) Weight:  [111.6 kg] 111.6 kg (10/16 0500) Last BM Date: 07/17/18 1676 IV 1010 urine NG 650 Drain 10 Stool 60 Afebrile, BP up some,  Na 153,  K+ 3.4, Creatinine 3.26, mag 2.8   Intake/Output from previous day: 10/15 0701 - 10/16 0700 In: 1676.7 [I.V.:1167.5; IV Piggyback:509.2] Out: 1245 [Urine:1010; Emesis/NG output:650; Drains:10; Stool:60] Intake/Output this shift: No intake/output data recorded.  General appearance: alert, cooperative and no distress Resp: clear to auscultation bilaterally GI: open abd with wound vac in place, ostomy working mostly clear liquid but fecal matter increasing.  Drain serous, Few BS.  Lab Results:  Recent Labs    07/16/18 0410 07/17/18 0346  WBC 14.9* 16.2*  HGB 8.3* 8.8*  HCT 26.3* 27.6*  PLT 258 318    BMET Recent Labs    07/18/18 0254 07/18/18 0427  NA 146* 153*  K 6.4* 3.4*  CL 113* 118*  CO2 21* 22  GLUCOSE 669* 189*  BUN 65* 67*  CREATININE 3.06* 3.26*  CALCIUM 9.5 9.6   PT/INR No results for input(s): LABPROT, INR in the last 72 hours.  Recent Labs  Lab 07/24/2018 1402 07/12/18 0340 07/13/18 0440 07/14/18 0432 07/17/18 0346  AST  --   --  49* 49* 73*  ALT  --   --  50* 37 40  ALKPHOS  --   --   50 61 84  BILITOT  --   --  1.1 0.8 3.0*  PROT  --   --  6.3* 6.3* 7.6  ALBUMIN 2.9* 3.0* 2.5* 2.1* 2.8*     Lipase  No results found for: LIPASE   Medications: . insulin aspart  0-9 Units Subcutaneous Q4H    Assessment/Plan Acute respiratory failure - on Vent - extubated 10/13 Acute kidney dz Creatinine 1.14 >>3.42 >> 3.23 >> 3.26 Hypertension Diabetes GERD Remote tobacco use BMI 36 Anemia - transfused Hypokalemia 2.9 - being replaced by CCM Malnutrition - moderate - prealbumin 12.2 Hypernatremia Post op ileus  Perforated splenic flexure colon cancer with large and small bowel obstruction Metastatic Colon adenocarcinoma - liver Bx also positive Exploratory laparotomy, left colectomy and liver biopsy, 07/30/2018, Dr. Rolm Bookbinder POD#7  FEN: NPO/ IV fluids/TNA ID: Lajean Silvius 10/8 =>>day 8; Cefotetan pre op DVT: SCD/Lovenox stopped Medicine: Dr. Evalee Mutton Ghimire/CCM: Dr. Darlys Gales Foley: In Follow up: Dr. Donne Hazel  Plan:  He is doing well from our standpoint.  Renal function is stable, He is starting to put our more thru his ostomy and NG drainage is down some.  He is not getting up much and we need to work on mobilizing him more.  Lovenox has been stopped  will defer to Dr. Trenton Gammon.    LOS: 8 days    Evelina Lore 07/18/2018 [phone]

## 2018-07-18 NOTE — Progress Notes (Addendum)
PHARMACY - ADULT TOTAL PARENTERAL NUTRITION CONSULT NOTE   Pharmacy Consult for TPN Indication: prolonged ileus  Patient Measurements: Height: 6' (182.9 cm) Weight: 246 lb 0.5 oz (111.6 kg) IBW/kg (Calculated) : 77.6 TPN AdjBW (KG): 88.3 Body mass index is 33.37 kg/m.  Assessment:  46 yom transferred from OSH with abdominal pain, N/V and found to have an acute perforated/obstructing large carcinoma in the colon with multiple metastases. S/p OR 10/9 for exlap and liver biopsy with colectomy. Attempted TF but with almost immediate vomiting. Started TPN for nutrition support. Pt is at risk for refeeding.   GI: s/p colectomy with going ileus -  No drain OP, NG OP 627mL Endo: Hx DM - CBGs currently controlled; overnight critical value of 669 thought to be due to possible contamination Insulin requirements in the past 24 hours: 11 units Lytes: K 3.4 (overnight K 6.4 due to contaminant), Mg 2.8, Phos WNL, Corr Ca 10.6, Na 153 (now off D5W), Cl 118 Renal: Scr trending up to 3.26, UOP down to 0.4  Pulm: RA; PRN albuterol Cards: VSS AC: sq heparin; HgB 8.8, PLT wnl Hepatobil: AST up slightly, albumin 2.8, prealbumin 13.2>>12.2, TG 274 Neuro: A&O x 4; Prn morphine/Apap ID: Zosyn postop for gross contamination with stool perforated colon - Afebrile, WBC 16.2  TPN Access: CVC TPN start date: 07/16/18 Nutritional Goals (per RD rec on 10/14): KCal: 2400-2600 KCal Protein: 120-135 g  Fluid: >/= 2L/day  Goal TPN rate is 100 ml/hr (provides 132g protein and 2472 KCal - 100% goal)  Current Nutrition:  NPO TPN  Plan:  Increase TPN to 62mL/hr - will slowly advance due to risk of refeeding This TPN provides 86 g of protein, 234 g of dextrose, and 47 g of lipids which provides 1607 kCals per day, meeting 65% of patient needs Electrolytes in TPN: Removed sodium, increase potassium, reduce calcium and magnesium, maximize acetate  Add K runs x4 Daily MVI, trace elements, Pepcid 20 mg to  TPN Change to rSSI and adjust as needed Monitor TPN labs, CBG, insulin requirements, and triglycerides  Willia Craze, Pharmacy Student  Remigio Eisenmenger D. Mina Marble, PharmD, BCPS, [place] 07/18/2018, 11:45 AM

## 2018-07-18 NOTE — Progress Notes (Signed)
Jerry Green  Jerry Green  GOT:[id number] DOB: [birth year] DOA: 07/06/2018 PCP: Ollen Bowl, MD    Brief Narrative:  62 yo M with hx of HTN and DM2 who was transferred from an OSH with 2-3 days of abd pain w/ N/V. He was found to have an acuteperforated obstructing large carcinoma of the splenic flexure of the colon with suspectedlymphatic metastases, peritoneal carcinomatosis, liver metastases, and invasion of the left kidney. He was taken to the OR 10/9 for ex lap and liver biopsy s/p colectomy and open abd with wound vac.  Significant Events: 10/8 transfer to [facility] from OSH 10/9 ex lap, left colectomy, biopsy of liver lesion 10/14 PICC placed 10/15 TNA initiated - TRH assumed care   Subjective: Continues to have high output via NG tube Ostomy shows solid fecal matter  Assessment & Plan: Acute Respiratory Failure in post operative setting Now off vent and doing well , on room air  Acute perforation due to large obstructing mass of the splenic flexure with lymphatic metastases peritoneal carcinomatosis and liver mets - Metastatic Adenocarcinoma of colon Post-op care per CCS-has ongoing ileus,continue NG tube, started on TPN - Oncology now following - plan is to initiate chemotherapy in the outpt setting per Dr. Benay Spice   Hypokalemia  Mg is normal - now on TNA - Pharmacy addressing w/ f/u BMET today   Hypernatremia Increase free water and trend   Nutrition  TNA dependent at present - diet per CCS  Acute kidney injury  Hydrate and follow - avoid potential renal insults   HTN BP reasonably controlled for this clinical situation - follow w/o change today  DM2 CBG reasonably controlled at this time   DVT prophylaxis: heparin Code Status: FULL CODE Family Communication: no family present at time of exam  Disposition Plan: pending improvement , due to postop ileus  Consultants:  CCS PCCM Oncology   Antimicrobials:  Zosyn 10/7  >  Objective: Blood pressure 129/82, pulse 84, temperature 98.2 F (36.8 C), temperature source Oral, resp. rate (!) 40, height 6' (1.829 m), weight 111.6 kg, SpO2 95 %.  Intake/Output Summary (Last 24 hours) at 07/18/2018 0924 Last data filed at 07/18/2018 0600 Gross per 24 hour  Intake 1546.34 ml  Output 1730 ml  Net -183.66 ml   Filed Weights   07/16/18 0600 07/17/18 0353 07/18/18 0500  Weight: 114.9 kg 111.8 kg 111.6 kg    Examination: General: No acute respiratory distress Lungs: Clear to auscultation bilaterally without wheezes or crackles Cardiovascular: Regular rate and rhythm without murmur gallop or rub normal S1 and S2 Abdomen: midline wound w/ VAC in place - ostomy in R mid abdom - no rebound, no mass, soft  Extremities: No significant cyanosis, clubbing, or edema bilateral lower extremities  CBC: Recent Labs  Lab 07/15/18 0232 07/15/18 1729 07/16/18 0410 07/17/18 0346  WBC 13.5*  --  14.9* 16.2*  NEUTROABS 10.2*  --  11.3* 11.7*  HGB 7.2* 8.4* 8.3* 8.8*  HCT 22.8* 26.2* 26.3* 27.6*  MCV 89.8  --  88.9 88.7  PLT 324  --  258 597   Basic Metabolic Panel: Recent Labs  Lab 07/17/18 0346 07/17/18 1439 07/18/18 0254 07/18/18 0427  NA 150*  --  146* 153*  K 2.9* 3.2* 6.4* 3.4*  CL 117*  --  113* 118*  CO2 22  --  21* 22  GLUCOSE 174*  --  669* 189*  BUN 61*  --  65* 67*  CREATININE  3.23*  --  3.06* 3.26*  CALCIUM 9.2  --  9.5 9.6  MG 2.6*  --  3.0* 2.8*  PHOS 3.6  --  5.5* 3.9   GFR: Estimated Creatinine Clearance: 29.9 mL/min (A) (by C-G formula based on SCr of 3.26 mg/dL (H)).  Liver Function Tests: Recent Labs  Lab 07/12/18 0340 07/13/18 0440 07/14/18 0432 07/17/18 0346  AST  --  49* 49* 73*  ALT  --  50* 37 40  ALKPHOS  --  50 61 84  BILITOT  --  1.1 0.8 3.0*  PROT  --  6.3* 6.3* 7.6  ALBUMIN 3.0* 2.5* 2.1* 2.8*    Coagulation Profile: Recent Labs  Lab 07/19/2018 1402  INR 1.23   HbA1C: Hgb A1c MFr Bld  Date/Time Value Ref  Range Status  07/14/2018 04:32 AM 7.4 (H) 4.8 - 5.6 % Final    Comment:    (NOTE) Pre diabetes:          5.7%-6.4% Diabetes:              >6.4% Glycemic control for   <7.0% adults with diabetes   07/22/2018 05:53 AM 7.5 (H) 4.8 - 5.6 % Final    Comment:    (NOTE) Pre diabetes:          5.7%-6.4% Diabetes:              >6.4% Glycemic control for   <7.0% adults with diabetes     CBG: Recent Labs  Lab 07/17/18 1150 07/17/18 1630 07/17/18 2005 07/17/18 2319 07/18/18 0336  GLUCAP 179* 143* 154* 171* 165*    Recent Results (from the past 240 hour(s))  MRSA PCR Screening     Status: None   Collection Time: 07/05/2018  5:51 AM  Result Value Ref Range Status   MRSA by PCR NEGATIVE NEGATIVE Final    Comment:        The GeneXpert MRSA Assay (FDA approved for NASAL specimens only), is one component of a comprehensive MRSA colonization surveillance program. It is not intended to diagnose MRSA infection nor to guide or monitor treatment for MRSA infections. Performed at [facility], [place], [place], [place] [address]      Scheduled Meds: . heparin injection (subcutaneous)  5,000 Units Subcutaneous Q8H  . insulin aspart  0-9 Units Subcutaneous Q4H   Continuous Infusions: . dextrose 50 mL/hr at 07/18/18 0252  . piperacillin-tazobactam (ZOSYN)  IV 12.5 mL/hr at 07/18/18 0600  . TPN ADULT (ION) 40 mL/hr at 07/18/18 0600     LOS: 8 days   Cherene Altes, MD [facility] Office  [phone] Pager - Text Page per Amion  If 7PM-7AM, please contact night-coverage per Amion 07/18/2018, 9:24 AM

## 2018-07-18 NOTE — Progress Notes (Signed)
Referral for Oncology F/U appointment faxed to [facility] at [facility] . Awaiting call back with specific time and date.

## 2018-07-18 NOTE — Plan of Care (Signed)
  Problem: Pain Managment: Goal: General experience of comfort will improve Outcome: Progressing   

## 2018-07-18 NOTE — Progress Notes (Signed)
Report called to receiving Rn [id number]. Patient with no complaints at the current time. Daughter notified of transfer. Will transfer in bed with RN.

## 2018-07-18 NOTE — Consult Note (Signed)
[place] Nurse wound follow up Wound type:midline abdominal wound NPWT VAC Change Measurement:not assessed today Wound QMK:JIZXY red bleeds with cleansing Drainage (amount, consistency, odor) moderate serosanguinous  No odor Periwound:RUQ transverse colostomy Dressing procedure/placement/frequency: 2 pieces black foam used today.  [place] Nurse ostomy follow up Stoma type/location: RUQ colostomy Stomal assessment/size: 2 " pale Peristomal assessment: intact Treatment options for stomal/peristomal skin: barrier ring  Switched back to 2 piece 2 3/4" pouch Output soft brown stool Ostomy pouching: 2pc.  2 3/4" pouch Education provided: none Enrolled patient in [employer] Discharge program: No WOC team will follow.  Domenic Moras MSN, RN, FNP-BC CWON Wound, Ostomy, Continence Nurse Pager [phone]

## 2018-07-19 ENCOUNTER — Inpatient Hospital Stay (HOSPITAL_COMMUNITY): Payer: Medicaid - Out of State

## 2018-07-19 DIAGNOSIS — K9189 Other postprocedural complications and disorders of digestive system: Secondary | ICD-10-CM

## 2018-07-19 DIAGNOSIS — K567 Ileus, unspecified: Secondary | ICD-10-CM

## 2018-07-19 DIAGNOSIS — Z933 Colostomy status: Secondary | ICD-10-CM

## 2018-07-19 DIAGNOSIS — C787 Secondary malignant neoplasm of liver and intrahepatic bile duct: Secondary | ICD-10-CM

## 2018-07-19 DIAGNOSIS — C182 Malignant neoplasm of ascending colon: Secondary | ICD-10-CM

## 2018-07-19 DIAGNOSIS — K631 Perforation of intestine (nontraumatic): Secondary | ICD-10-CM

## 2018-07-19 LAB — GLUCOSE, CAPILLARY
GLUCOSE-CAPILLARY: 202 mg/dL — AB (ref 70–99)
GLUCOSE-CAPILLARY: 205 mg/dL — AB (ref 70–99)
GLUCOSE-CAPILLARY: 236 mg/dL — AB (ref 70–99)
Glucose-Capillary: 202 mg/dL — ABNORMAL HIGH (ref 70–99)
Glucose-Capillary: 211 mg/dL — ABNORMAL HIGH (ref 70–99)
Glucose-Capillary: 242 mg/dL — ABNORMAL HIGH (ref 70–99)

## 2018-07-19 LAB — COMPREHENSIVE METABOLIC PANEL
ALBUMIN: 2.5 g/dL — AB (ref 3.5–5.0)
ALK PHOS: 119 U/L (ref 38–126)
ALT: 94 U/L — ABNORMAL HIGH (ref 0–44)
ANION GAP: 15 (ref 5–15)
AST: 137 U/L — ABNORMAL HIGH (ref 15–41)
BUN: 74 mg/dL — ABNORMAL HIGH (ref 8–23)
CO2: 22 mmol/L (ref 22–32)
Calcium: 9.3 mg/dL (ref 8.9–10.3)
Chloride: 115 mmol/L — ABNORMAL HIGH (ref 98–111)
Creatinine, Ser: 3.41 mg/dL — ABNORMAL HIGH (ref 0.61–1.24)
GFR calc Af Amer: 21 mL/min — ABNORMAL LOW (ref >60)
GFR calc non Af Amer: 18 mL/min — ABNORMAL LOW (ref >60)
GLUCOSE: 243 mg/dL — AB (ref 70–99)
Potassium: 3.8 mmol/L (ref 3.5–5.1)
Sodium: 152 mmol/L — ABNORMAL HIGH (ref 135–145)
TOTAL PROTEIN: 7.8 g/dL (ref 6.5–8.1)
Total Bilirubin: 1.9 mg/dL — ABNORMAL HIGH (ref 0.3–1.2)

## 2018-07-19 LAB — CBC
HCT: 29.8 % — ABNORMAL LOW (ref 39.0–52.0)
HEMOGLOBIN: 9.6 g/dL — AB (ref 13.0–17.0)
MCH: 28.6 pg (ref 26.0–34.0)
MCHC: 32.2 g/dL (ref 30.0–36.0)
MCV: 88.7 fL (ref 80.0–100.0)
NRBC: 0.2 % (ref 0.0–0.2)
Platelets: 400 10*3/uL (ref 150–400)
RBC: 3.36 MIL/uL — AB (ref 4.22–5.81)
RDW: 14.4 % (ref 11.5–15.5)
WBC: 17.8 10*3/uL — ABNORMAL HIGH (ref 4.0–10.5)

## 2018-07-19 LAB — MAGNESIUM: MAGNESIUM: 2.6 mg/dL — AB (ref 1.7–2.4)

## 2018-07-19 LAB — PHOSPHORUS: Phosphorus: 4.6 mg/dL (ref 2.5–4.6)

## 2018-07-19 MED ORDER — TRAVASOL 10 % IV SOLN
INTRAVENOUS | Status: AC
  Administered 2018-07-19: 17:00:00 via INTRAVENOUS
  Filled 2018-07-19: qty 1320

## 2018-07-19 MED ORDER — MORPHINE SULFATE (PF) 2 MG/ML IV SOLN
1.0000 mg | INTRAVENOUS | Status: DC | PRN
  Administered 2018-07-21 – 2018-07-25 (×6): 2 mg via INTRAVENOUS
  Filled 2018-07-19 (×7): qty 1

## 2018-07-19 MED ORDER — SODIUM CHLORIDE 0.9 % IV SOLN
INTRAVENOUS | Status: DC
  Administered 2018-07-19 – 2018-07-22 (×3): via INTRAVENOUS

## 2018-07-19 NOTE — Progress Notes (Signed)
Patient having intermittent confusion. A few times during shift, patient would have legs off the side of the bed. At approximately 0200, patient stated that he needed to leave to check on his daughter because she had been calling. Patient stated he would come back when he was done with checking on her. Patient irritable with reoriented and explaining to patient that he could not leave hospital. This morning, patient irritable that staff will not let him go home to change his clothes and wash up. Patient stated that the doctor could visit his apartment to see him. Staff explains patient's condition, but patient continues to not understand.

## 2018-07-19 NOTE — Progress Notes (Signed)
Physical Therapy Treatment Patient Details Name: Jerry Green MRN: [id number] DOB: 1956-02-15 Today's Date: 07/19/2018    History of Present Illness  Jerry Green is a 62 y.o. male with medical history significant of HTN, DM type 2, and Gerd; who initially presented to [facility] with complaints of progressively worsening right lower quadrant abdominal pain over the last 3-4 days. 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  Taken to OR 10/9 for ex lap and liver biopsy s/p colectomy and open abd with wound vac.    PT Comments    Pt performed gait training from bed to chair and mobility in bed and transfers OOB.  On arrival patient saturated in urine and unaware of his incontinence.  Cleaned patient and changed gown and left in chair and bed remained soiled.  Pt limited to transfer to chair due to NG on continuous suction.  Informed nursing of mobility and urinary incontinence.  Continue to recommend CIR therapies for intensive rehab to improve function before returning home.     Follow Up Recommendations  CIR     Equipment Recommendations  None recommended by PT(TBD)    Recommendations for Other Services       Precautions / Restrictions Precautions Precautions: Fall;Other (comment) Precaution Comments: wound vac and jp at abdomen, ng Restrictions Weight Bearing Restrictions: No    Mobility  Bed Mobility Overal bed mobility: Needs Assistance Bed Mobility: Rolling;Sidelying to Sit Rolling: Mod assist;+2 for safety/equipment Sidelying to sit: Mod assist;+2 for safety/equipment       General bed mobility comments: Performed rolling to reduce pain and improve ease.  Pt required cues for flexing L knee and reaching across with LUE to  roll to the R side of the bed.  Pt required cues for LE advancement to edge of bed, and trunk elevation to achieve sitting edge of bed.  Pt able to scoot himself forward once in sitting.  Pt saturated in urine so washed his back in sitting before progressing to transfer and he wea able to maintain his balance well.    Transfers Overall transfer level: Needs assistance Equipment used: Rolling walker (2 wheeled) Transfers: Sit to/from Stand Sit to Stand: Mod assist;+2 physical assistance         General transfer comment: Cues for hand placement to and from seated surface,  Pt remains to require assistance to boost into standing.  Did not use gait belt due to drains and wound vac site on abdomen.  Once in standing required min +2 for static stance.    Ambulation/Gait Ambulation/Gait assistance: Mod assist;+2 physical assistance Gait Distance (Feet): 6 Feet(steps from bed to chair required assistance to turn and back with RW.  Pt is slow and guarded and fatigues quickly.  Required +2 assistance to manage lines and leads.  Pt limited in gait due to NG tube on suction.  ) Assistive device: Rolling walker (2 wheeled) Gait Pattern/deviations: Step-to pattern;Decreased step length - right;Decreased stride length;Decreased dorsiflexion - right;Staggering right;Narrow base of support Gait velocity: slow   General Gait Details: Pt required cues to turn, sidestep and back to seated surface.  Assistance to turn RW.     Stairs             Wheelchair Mobility    Modified Rankin (Stroke Patients  Only)       Balance Overall balance assessment: Needs assistance Sitting-balance support: Feet supported;Bilateral upper extremity supported Sitting balance-Leahy Scale: Fair     Standing balance support: Bilateral upper extremity supported;During functional activity Standing balance-Leahy Scale: Poor Standing balance comment: dependent on RW and physical assist                             Cognition Arousal/Alertness: Awake/alert Behavior During Therapy: WFL for tasks assessed/performed Overall Cognitive Status: Within Functional Limits for tasks assessed                                        Exercises Total Joint Exercises Quad Sets: AROM;Both;10 reps;Supine Heel Slides: AROM;Both;10 reps;Supine Long Arc Quad: AROM;Both;10 reps;Supine General Exercises - Lower Extremity Hip Flexion/Marching: AROM;Both;10 reps;Seated    General Comments        Pertinent Vitals/Pain Pain Assessment: Faces Pain Score: 5  Pain Location: surgical site Pain Descriptors / Indicators: Aching Pain Intervention(s): Monitored during session;Repositioned    Home Living                      Prior Function            PT Goals (current goals can now be found in the care plan section) Acute Rehab PT Goals Patient Stated Goal: get stronger Potential to Achieve Goals: Good Progress towards PT goals: Progressing toward goals    Frequency    Min 4X/week      PT Plan Current plan remains appropriate    Co-evaluation              AM-PAC PT "6 Clicks" Daily Activity  Outcome Measure  Difficulty turning over in bed (including adjusting bedclothes, sheets and blankets)?: Unable Difficulty moving from lying on back to sitting on the side of the bed? : Unable Difficulty sitting down on and standing up from a chair with arms (e.g., wheelchair, bedside commode, etc,.)?: Unable Help needed moving to and from a bed to chair (including a wheelchair)?: A Lot Help needed walking in hospital room?: A Lot Help needed climbing 3-5 steps with a railing? : Total 6 Click Score: 8    End of Session Equipment Utilized During Treatment: Gait belt Activity Tolerance: Patient tolerated treatment well Patient left: in bed;with call bell/phone within reach Nurse Communication: Mobility status PT Visit Diagnosis: Unsteadiness on feet (R26.81);Difficulty in  walking, not elsewhere classified (R26.2);Muscle weakness (generalized) (M62.81)     Time: 7124-5809 PT Time Calculation (min) (ACUTE ONLY): 29 min  Charges:  $Therapeutic Exercise: 8-22 mins $Therapeutic Activity: 8-22 mins                     Governor Rooks, PTA Acute Rehabilitation Services Pager [phone] Office [phone]     Codie Hainer Eli Hose 07/19/2018, 11:42 AM

## 2018-07-19 NOTE — Progress Notes (Signed)
I had a brief discussion with Mr. Jerry Green yesterday and again this morning.  He lives near [place].  He indicates he prefers to receive oncology care closer to home.  We contacted the [facility] cancer center.  An appointment appointment will be scheduled there for within the next 2-3 weeks.  Please call medical oncology as needed while he is here.

## 2018-07-19 NOTE — Progress Notes (Addendum)
Physical Therapy Treatment Patient Details Name: Jerry Green MRN: [id number] DOB: 1956-01-21 Today's Date: 07/19/2018    History of Present Illness  Jerry Green is a 61 y.o. male with medical history significant of HTN, DM type 2, and Gerd; who initially presented to [facility] with complaints of progressively worsening right lower quadrant abdominal pain over the last 3-4 days. 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  Taken to OR 10/9 for ex lap and liver biopsy s/p colectomy and open abd with wound vac.    PT Comments    PTA asked to assist in transferring patient back to bed.  He continues to required moderate assistance with +2 for safety.  Pt presents with urinary incontinence on arrival and he required multiple transfer for pericare.  Transferred back to bed as patient is awaiting transfer to CT.  Refer to previous noted for formal PT tx.     Follow Up Recommendations  CIR     Equipment Recommendations  None recommended by PT(TBD )    Recommendations for Other Services       Precautions / Restrictions Precautions Precautions: Fall;Other (comment) Precaution Comments: wound vac and jp at abdomen, ng Restrictions Weight Bearing Restrictions: No    Mobility  Bed Mobility Overal bed mobility: Needs Assistance Bed Mobility: Rolling;Sit to Sidelying Rolling: Mod assist;+2 for physical assistance Sidelying to sit: Mod assist;+2 for safety/equipment     Sit to sidelying: Mod assist;+2 for physical assistance General bed mobility comments: Pt required assistance to lower trunk back to bed and maintain sidelying to reduce pain in abdomen.  Pt also required assistance to lift B LEs back to bed.    Transfers Overall transfer  level: Needs assistance Equipment used: Rolling walker (2 wheeled) Transfers: Sit to/from Stand Sit to Stand: Mod assist;+2 safety/equipment         General transfer comment: Boosted patient into standing he required cues for hand placement to maintain safety.    Ambulation/Gait Ambulation/Gait assistance: Mod assist;+2 physical assistance Gait Distance (Feet): 6 Feet(from recliner chair back to bed) Assistive device: Rolling walker (2 wheeled) Gait Pattern/deviations: Step-to pattern;Decreased step length - right;Decreased stride length;Decreased dorsiflexion - right;Staggering right;Narrow base of support Gait velocity: slow   General Gait Details: Pt required cues to turn, sidestep and back to seated surface.  Assistance to turn RW.     Stairs             Wheelchair Mobility    Modified Rankin (Stroke Patients Only)       Balance Overall balance assessment: Needs assistance Sitting-balance support: Feet supported;Bilateral upper extremity supported Sitting balance-Leahy Scale: Fair     Standing balance support: Bilateral upper extremity supported;During functional activity Standing balance-Leahy Scale: Poor Standing balance comment: dependent on RW and physical assist                            Cognition Arousal/Alertness: Awake/alert Behavior During Therapy: WFL for tasks assessed/performed Overall Cognitive Status: Within Functional Limits for tasks assessed                                        Exercises  General Comments        Pertinent Vitals/Pain Pain Assessment: Faces Pain Score: 5  Pain Location: surgical site Pain Descriptors / Indicators: Aching Pain Intervention(s): Monitored during session;Repositioned    Home Living                      Prior Function            PT Goals (current goals can now be found in the care plan section) Acute Rehab PT Goals Patient Stated Goal: get  stronger Potential to Achieve Goals: Good Progress towards PT goals: Progressing toward goals    Frequency    Min 4X/week      PT Plan Current plan remains appropriate    Co-evaluation              AM-PAC PT "6 Clicks" Daily Activity  Outcome Measure  Difficulty turning over in bed (including adjusting bedclothes, sheets and blankets)?: Unable Difficulty moving from lying on back to sitting on the side of the bed? : Unable Difficulty sitting down on and standing up from a chair with arms (e.g., wheelchair, bedside commode, etc,.)?: Unable Help needed moving to and from a bed to chair (including a wheelchair)?: A Lot Help needed walking in hospital room?: A Lot Help needed climbing 3-5 steps with a railing? : Total 6 Click Score: 8    End of Session Equipment Utilized During Treatment: Gait belt Activity Tolerance: Patient tolerated treatment well Patient left: in bed;with call bell/phone within reach Nurse Communication: Mobility status PT Visit Diagnosis: Unsteadiness on feet (R26.81);Difficulty in walking, not elsewhere classified (R26.2);Muscle weakness (generalized) (M62.81)     Time: 8657-8469 PT Time Calculation (min) (ACUTE ONLY): 24 min  Charges: $Therapeutic Activity: 23-37 mins                     Governor Rooks, PTA Acute Rehabilitation Services Pager [phone] Office [phone]     Jerry Green 07/19/2018, 2:09 PM

## 2018-07-19 NOTE — Progress Notes (Signed)
While rounding, pt found without NG tube in place. Pt denies removing NG tube, however tube was still in place while RN rounded less than one hour prior. Dr. Allyson Sabal paged and notified who said to page surgical PA for further orders. Pt very confused at the moment (has episodes of clarity but has been more confused than lucid during shift so far). Obie Dredge, [place] notified and she said to leave NG tube out for now and will see what the CT looks like (transport on the way to get pt). Kathlee Nations said if pt becomes distended and nauseous NG will have to be replaced with large bore NGT but to monitor for now.  Pt will be moved to room 5W25 to be closer to the desk for closer monitoring.

## 2018-07-19 NOTE — Progress Notes (Addendum)
Jerry Green - Stepdown/ICU TEAM  Jerry Green  YWV:[id number] DOB: [birth year] DOA: 07/11/2018 PCP: Ollen Bowl, MD    Brief Narrative:  62 yo M with hx of HTN and DM2 who was transferred from an OSH with 2-3 days of abd pain w/ N/V. He was found to have an acuteperforated obstructing large carcinoma of the splenic flexure of the colon with suspectedlymphatic metastases, peritoneal carcinomatosis, liver metastases, and invasion of the left kidney. He was taken to the OR 10/9 for ex lap and liver biopsy s/p colectomy and open abd with wound vac.  Significant Events: 10/8 transfer to [facility] from OSH 10/9 ex lap, left colectomy, biopsy of liver lesion 10/14 PICC placed 10/15 TNA initiated - [facility] assumed care   Subjective: Continues to have high output via NG tube He has been noted to be more confused today   Assessment & Plan: Acute Respiratory Failure in post operative setting Now off vent and doing well , on room air  Acute perforation due to large obstructing mass of the splenic flexure with lymphatic metastases peritoneal carcinomatosis and liver mets - Metastatic Adenocarcinoma of colon Post-op care per [facility]-has ongoing ileus, NG tube removed by patient , started on TPN - Oncology now following - plan is to initiate chemotherapy in the outpt setting per Dr. Benay Spice in [place]  With rising white count and persistent ileus,surgery  Ordered  CAT scan abdomen pelvis to rule out any undrained fluid collection  Transient confusion Reoriented , minimize narcotics   Hypokalemia  Mg is normal - now on TNA - Pharmacy addressing w/ f/u BMET today   Hypernatremia Increase free water and trend   Nutrition  TNA dependent at present - diet per [facility]  Acute kidney injury cr 2.32>3.41 since 10/12 Hydrate and follow -likely due to insensible losses  Started on supplemental IVF   HTN BP reasonably controlled for this clinical situation - follow w/o change today  DM2 CBG reasonably  controlled at this time   DVT prophylaxis: heparin Code Status: FULL CODE Family Communication: no family present at time of exam  Disposition Plan: pending improvement , due to postop ileus  Consultants:  [facility] PCCM Oncology   Antimicrobials:  Zosyn 10/7 >  Objective: Blood pressure 128/76, pulse 98, temperature 99.8 F (37.7 C), resp. rate (!) 21, height 6' (Green.829 m), weight 113.5 kg, SpO2 97 %.  Intake/Output Summary (Last 24 hours) at 07/19/2018 1336 Last data filed at 07/19/2018 1100 Gross per 24 hour  Intake 1953.3 ml  Output 2940 ml  Net -986.7 ml   Filed Weights   07/18/18 0500 07/18/18 1700 07/19/18 0500  Weight: 111.6 kg 111.6 kg 113.5 kg    Examination: General: No acute respiratory distress Lungs: Clear to auscultation bilaterally without wheezes or crackles Cardiovascular: Regular rate and rhythm without murmur gallop or rub normal S1 and S2 Abdomen: midline wound w/ VAC in place - ostomy in R mid abdom - no rebound, no mass, soft  Extremities: No significant cyanosis, clubbing, or edema bilateral lower extremities  CBC: Recent Labs  Lab 07/15/18 0232  07/16/18 0410 07/17/18 0346 07/19/18 0009  WBC 13.5*  --  14.9* 16.2* 17.8*  NEUTROABS 10.2*  --  11.3* 11.7*  --   HGB 7.2*   < > 8.3* 8.8* 9.6*  HCT 22.8*   < > 26.3* 27.6* 29.8*  MCV 89.8  --  88.9 88.7 88.7  PLT 324  --  258 318 400   < > =  values in this interval not displayed.   Basic Metabolic Panel: Recent Labs  Lab 07/18/18 0254 07/18/18 0427 07/19/18 0009 07/19/18 0319  NA 146* 153* 152*  --   K 6.4* 3.4* 3.8  --   CL 113* 118* 115*  --   CO2 21* 22 22  --   GLUCOSE 669* 189* 243*  --   BUN 65* 67* 74*  --   CREATININE 3.06* 3.26* 3.41*  --   CALCIUM 9.5 9.6 9.3  --   MG 3.0* 2.8*  --  2.6*  PHOS 5.5* 3.9  --  4.6   GFR: Estimated Creatinine Clearance: 28.9 mL/min (A) (by C-G formula based on SCr of 3.41 mg/dL (H)).  Liver Function Tests: Recent Labs  Lab 07/13/18 0440  07/14/18 0432 07/17/18 0346 07/19/18 0009  AST 49* 49* 73* 137*  ALT 50* 37 40 94*  ALKPHOS 50 61 84 119  BILITOT Green.Green 0.8 3.0* Green.9*  PROT 6.3* 6.3* 7.6 7.8  ALBUMIN 2.5* 2.Green* 2.8* 2.5*    Coagulation Profile: No results for input(s): INR, PROTIME in the last 168 hours. HbA1C: Hgb A1c MFr Bld  Date/Time Value Ref Range Status  07/14/2018 04:32 AM 7.4 (H) 4.8 - 5.6 % Final    Comment:    (NOTE) Pre diabetes:          5.7%-6.4% Diabetes:              >6.4% Glycemic control for   <7.0% adults with diabetes   07/13/2018 05:53 AM 7.5 (H) 4.8 - 5.6 % Final    Comment:    (NOTE) Pre diabetes:          5.7%-6.4% Diabetes:              >6.4% Glycemic control for   <7.0% adults with diabetes     CBG: Recent Labs  Lab 07/18/18 2022 07/19/18 0002 07/19/18 0430 07/19/18 0743 07/19/18 1140  GLUCAP 161* 211* 202* 205* 236*    Recent Results (from the past 240 hour(s))  MRSA PCR Screening     Status: None   Collection Time: 07/30/2018  5:51 AM  Result Value Ref Range Status   MRSA by PCR NEGATIVE NEGATIVE Final    Comment:        The GeneXpert MRSA Assay (FDA approved for NASAL specimens only), is one component of a comprehensive MRSA colonization surveillance program. It is not intended to diagnose MRSA infection nor to guide or monitor treatment for MRSA infections. Performed at [facility], [place] [address], [place], [place] [address]      Scheduled Meds: . heparin injection (subcutaneous)  5,000 Units Subcutaneous Q8H  . insulin aspart  0-20 Units Subcutaneous Q4H   Continuous Infusions: . sodium chloride    . dextrose 50 mL/hr at 07/19/18 0645  . piperacillin-tazobactam (ZOSYN)  IV 3.375 g (07/19/18 1146)  . TPN ADULT (ION) 65 mL/hr at 07/19/18 0645  . TPN ADULT (ION)       LOS: 9 days   Cherene Altes, MD [facility] Office  [phone] Pager - Text Page per Amion  If 7PM-7AM, please contact night-coverage per Amion 07/19/2018,  Green:36 PM

## 2018-07-19 NOTE — Progress Notes (Signed)
PHARMACY - ADULT TOTAL PARENTERAL NUTRITION CONSULT NOTE   Pharmacy Consult for TPN Indication: prolonged ileus  Patient Measurements: Height: 6' (182.9 cm) Weight: 250 lb 3.6 oz (113.5 kg) IBW/kg (Calculated) : 77.6 TPN AdjBW (KG): 86.1 Body mass index is 33.94 kg/m.  Assessment:  82 yom transferred from OSH with abdominal pain, N/V and found to have an acute perforated/obstructing large carcinoma in the colon with multiple metastases. S/p OR 10/9 for exlap and liver biopsy with colectomy. Attempted TF but with almost immediate vomiting. Started TPN for nutrition support. Pt is at risk for refeeding.   GI: s/p colectomy with going ileus -  122mL drain OP, NG OP 1142mL Endo: Hx DM - CBGs mildly elevated (153-211) Insulin requirements since 1800: 18 units Lytes: K 3.8, Mg 2.6, Phos WNL, Corr Ca 10.5, Na 152 (D5 at 70ml/hr), Cl 115 Renal: Scr trending up to 3.41, UOP down to 0.4  Pulm: RA; PRN albuterol Cards: VSS AC: sq heparin; HgB 9.6, PLT wnl Hepatobil: LFTs trending up slightly, albumin 2.5, tbili down to 1.9, prealbumin 13.2>>12.2, TG 274 Neuro: A&O x 4 but confused overnight; Prn morphine/Apap ID: Zosyn postop for gross contamination with stool perforated colon - Afebrile, WBC 17.8  TPN Access: CVC TPN start date: 07/16/18 Nutritional Goals (per RD rec on 10/14): KCal: 2400-2600 KCal Protein: 120-135 g  Fluid: >/= 2L/day  Goal TPN rate is 100 ml/hr (provides 132g protein and 2472 KCal - 100% goal)  Current Nutrition:  NPO TPN  Plan:  Increase TPN to goal rate of 100 mL/hr since electrolytes have stabilized This TPN provides 132 g of protein, 360 g of dextrose, and 72 g of lipids which provides 2472 kCals per day, meeting 100% of patient needs Electrolytes in TPN: Removed sodium, remove magnesium, reduce calcium, maximize acetate - will get more potassium with increasing rate of TPN Daily MVI, trace elements, Pepcid 20 mg to TPN Change to rSSI and adjust as  needed Add insulin 10 units to TPN Monitor TPN labs, CBG, insulin requirements, and triglycerides F/u NG clamping and diet plans  Salome Arnt, PharmD, BCPS Please see AMION for all pharmacy numbers 07/19/2018 8:05 AM

## 2018-07-19 NOTE — Progress Notes (Addendum)
Nutrition Follow-up  DOCUMENTATION CODES:   Obesity unspecified  INTERVENTION:   - TPN per pharmacy  - Will follow for return of bowel function and diet advancement and supplement as appropriate  NUTRITION DIAGNOSIS:   Inadequate oral intake related to poor appetite, nausea, vomiting as evidenced by per patient/family report, NPO status.  Ongoing  GOAL:   Patient will meet greater than or equal to 90% of their needs  Met via TPN at goal rate  MONITOR:   Diet advancement, I & O's, Labs, Skin, TPN  REASON FOR ASSESSMENT:   Consult, Ventilator Enteral/tube feeding initiation and management  ASSESSMENT:   62 year old male who presented with abdominal pain. PMH significant for hypertension, type 2 diabetes mellitus, GERD. 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  10/9 - s/p ex lap, liver biopsy, colectomy with open abdomen and wound VAC 10/11 - start trickle feeds Vital HP @ 20 ml/hr, pt vomited, TF stopped 10/13 - extubated, TPN started 10/16 - TPN increased to 65 ml/hr 10/17 - TPN to be increased to goal rate of 100 ml/hr  Pt with post-op ileus. Per MD, ileus persists. Per chart, pt with NG tube to low intermittent suction.  Weight down 15 lbs since admission, likely related to fluid status as well as several days without adequate nutrition.  TPN infusing at 65 ml/hr at time of visit. Plan is to increase rate to goal of 100 ml/hr this PM.  Pt in recliner at time of visit.  No NGT present. Unable to visualize NGT. Pt states that "it just fell out" and "no one has come to put it back in." Alerted RN who reports pt must have pulled NGT out.  Per RN note, PA said to leave NGT out for CT scan. NGT will have to be replaced if pt becomes distended and nauseous. Pt now moved to room closer to nursing station.  Pt reports he  is doing well. Pt is somewhat confused, states he wants to go to rehab in [place].  PT entering room at time of visit to put pt back in bed for CT scan.  Current TPN at goal rate: 100 ml/hr to provides 2472 kcal, 132 grams of protein, 360 grams of dextrose, and 72 grams of lipids.  Medications reviewed and include: TPN, sliding scale Novolog, D5 @ 50 ml/hr, IV antibiotics  Labs reviewed: sodium 152 (H), BUN 74 (H), creatinine 3.41 (H), magnesium 2.6 (H), elevated LFTs, hemoglobin 9.6 (L), HCT 29.8 (L) CBG's: 236, 205, 202, 211, 161, 153 x 24 hours  UOP: 1100 ml x 24 hours NGT output: 1100 ml x 24 hours JP drain: 25 ml x 24 hours Wound VAC: 125 ml x 24 hours Stool via colostomy: 70 ml x 24 hours I/O's: -1.8 L since admit  Diet Order:   Diet Order            Diet NPO time specified Except for: Ice Chips, Other (See Comments)  Diet effective now              EDUCATION NEEDS:   No education needs have been identified at this time  Skin:  Skin Assessment: Skin Integrity Issues: Wound Vac: open abdomen  Last BM:  10/17 - 70 ml via colostomy x 24 hours  Height:   Ht Readings from Last 1 Encounters:  07/18/18 6' (1.829 m)    Weight:  Wt Readings from Last 1 Encounters:  07/19/18 113.5 kg    Ideal Body Weight:  80.91 kg  BMI:  Body mass index is 33.94 kg/m.  Estimated Nutritional Needs:   Kcal:  2400-2600 kcal   Protein:  120-135 grams  Fluid:  >/= 2 L/day    Gaynell Face, MS, RD, LDN Inpatient Clinical Dietitian Pager: [phone] Weekend/After Hours: [phone]

## 2018-07-19 NOTE — Progress Notes (Signed)
[facility] Progress Note  8 Days Post-Op  Subjective: CC:  Confused this AM. Denies pain. Unable to tell me if he is having stool in his ostomy pouch. Reports frustration with the staff - saying things are not "being hooked up right".   Objective: Vital signs in last 24 hours: Temp:  [98.5 F (36.9 C)-99.6 F (37.6 C)] 99.6 F (37.6 C) (10/17 0433) Pulse Rate:  [86-111] 100 (10/17 0500) Resp:  [20-41] 26 (10/17 0500) BP: (123-144)/(80-98) 123/86 (10/17 0500) SpO2:  [92 %-100 %] 96 % (10/17 0500) Weight:  [111.6 kg-113.5 kg] 113.5 kg (10/17 0500) Last BM Date: 07/19/18  Intake/Output from previous day: 10/16 0701 - 10/17 0700 In: 2138 [I.V.:1775.3; IV Piggyback:362.7] Out: 2420 [Urine:1100; Emesis/NG output:1100; Drains:150; Stool:70] Intake/Output this shift: No intake/output data recorded.  PE: Gen:  Alert, NAD, cooperative   Card:  Regular rate and rhythm Pulm:  Normal effort Abd: Soft, non-tender, some distention, VAC in place, drain with minimal SS drainage, colostomy pouch with dark out put - looks like old sanguinous drainage, no gas, stoma is viable   NG - 1100 cc dark bilious output Skin: warm and dry, no rashes  Psych: A&Ox3   Lab Results:  Recent Labs    07/17/18 0346 07/19/18 0009  WBC 16.2* 17.8*  HGB 8.8* 9.6*  HCT 27.6* 29.8*  PLT 318 400   BMET Recent Labs    07/18/18 0427 07/19/18 0009  NA 153* 152*  K 3.4* 3.8  CL 118* 115*  CO2 22 22  GLUCOSE 189* 243*  BUN 67* 74*  CREATININE 3.26* 3.41*  CALCIUM 9.6 9.3   PT/INR No results for input(s): LABPROT, INR in the last 72 hours. CMP     Component Value Date/Time   NA 152 (H) 07/19/2018 0009   K 3.8 07/19/2018 0009   CL 115 (H) 07/19/2018 0009   CO2 22 07/19/2018 0009   GLUCOSE 243 (H) 07/19/2018 0009   BUN 74 (H) 07/19/2018 0009   CREATININE 3.41 (H) 07/19/2018 0009   CALCIUM 9.3 07/19/2018 0009   PROT 7.8 07/19/2018 0009   ALBUMIN 2.5 (L) 07/19/2018 0009   AST 137  (H) 07/19/2018 0009   ALT 94 (H) 07/19/2018 0009   ALKPHOS 119 07/19/2018 0009   BILITOT 1.9 (H) 07/19/2018 0009   GFRNONAA 18 (L) 07/19/2018 0009   GFRAA 21 (L) 07/19/2018 0009   Anti-infectives: Anti-infectives (From admission, onward)   Start     Dose/Rate Route Frequency Ordered Stop   07/27/2018 1800  piperacillin-tazobactam (ZOSYN) IVPB 3.375 g     3.375 g 12.5 mL/hr over 240 Minutes Intravenous Every 8 hours 07/16/2018 1444     07/21/2018 0915  cefoTEtan (CEFOTAN) 2 g in sodium chloride 0.9 % 100 mL IVPB     2 g 200 mL/hr over 30 Minutes Intravenous To Short Stay 07/31/2018 0857 07/13/2018 1900   08/02/2018 0906  cefoTEtan in Dextrose 5% (CEFOTAN) 2-2.08 GM-%(50ML) IVPB    Note to Pharmacy:  [name]   : cabinet override      07/27/2018 0906 07/20/2018 2114   07/23/2018 1200  piperacillin-tazobactam (ZOSYN) IVPB 3.375 g  Status:  Discontinued     3.375 g 12.5 mL/hr over 240 Minutes Intravenous Every 8 hours 07/14/2018 0615 07/31/2018 1341   07/18/2018 0730  cefoTEtan (CEFOTAN) 2 g in sodium chloride 0.9 % 100 mL IVPB  Status:  Discontinued     2 g 200 mL/hr over 30 Minutes Intravenous On call to O.R. 07/05/2018  1030 07/22/2018 0559   07/28/2018 0615  piperacillin-tazobactam (ZOSYN) IVPB 3.375 g     3.375 g 100 mL/hr over 30 Minutes Intravenous  Once 07/09/2018 0612 07/22/2018 1943     Assessment/Plan Acute respiratory failure - on Vent- extubated 10/13 Acute kidney dz Creatinine 1.14 >>3.42>> 3.23 >> 3.26 >> 3.41 Hypertension Diabetes GERD Remote tobacco use BMI 36 Anemia - transfused Hypokalemia - resolved  Hypernatremia  Malnutrition - moderate - prealbumin 12.2 Hypernatremia -152  Post op ileus  Perforated splenic flexure colon cancer with large and small bowel obstruction Metastatic Colonadenocarcinoma - liver Bx also positive S/P Exploratory laparotomy, left colectomy and liver biopsy, 07/19/2018, Dr. Rolm Bookbinder POD#[id number]  Post-op ileus   Continue NGT to LIWS - 1,100 cc/24h    Continue JP  Ambulate/IS   FEN: NPO/ IV fluids/TNA ; correction of hypernatremia and hyperglycemia per primary team.   ID: Zosyn 10/8 =>>day9; Cefotetan pre op  DVT: SCD/subcutaneous heparin Medicine: Dr. Evalee Mutton Ghimire/CCM: Dr. Darlys Gales Foley: condom cath  Follow up: Dr. Donne Hazel    LOS: 9 days    Obie Dredge, [facility] Pager: [phone]

## 2018-07-19 NOTE — Progress Notes (Signed)
NGT replaced before pt left floor for CT scan. CT stated they could confirm placement when they did their procedure. NGT inserted in right nare with initial placement confirmed by auscultation.

## 2018-07-20 ENCOUNTER — Inpatient Hospital Stay (HOSPITAL_COMMUNITY): Payer: Medicaid - Out of State

## 2018-07-20 LAB — BASIC METABOLIC PANEL
Anion gap: 15 (ref 5–15)
BUN: 85 mg/dL — AB (ref 8–23)
CO2: 20 mmol/L — ABNORMAL LOW (ref 22–32)
CREATININE: 3.66 mg/dL — AB (ref 0.61–1.24)
Calcium: 9.5 mg/dL (ref 8.9–10.3)
Chloride: 117 mmol/L — ABNORMAL HIGH (ref 98–111)
GFR, EST AFRICAN AMERICAN: 19 mL/min — AB (ref >60)
GFR, EST NON AFRICAN AMERICAN: 16 mL/min — AB (ref >60)
Glucose, Bld: 273 mg/dL — ABNORMAL HIGH (ref 70–99)
POTASSIUM: 4.3 mmol/L (ref 3.5–5.1)
Sodium: 152 mmol/L — ABNORMAL HIGH (ref 135–145)

## 2018-07-20 LAB — GLUCOSE, CAPILLARY
GLUCOSE-CAPILLARY: 288 mg/dL — AB (ref 70–99)
Glucose-Capillary: 254 mg/dL — ABNORMAL HIGH (ref 70–99)
Glucose-Capillary: 255 mg/dL — ABNORMAL HIGH (ref 70–99)
Glucose-Capillary: 256 mg/dL — ABNORMAL HIGH (ref 70–99)
Glucose-Capillary: 264 mg/dL — ABNORMAL HIGH (ref 70–99)
Glucose-Capillary: 305 mg/dL — ABNORMAL HIGH (ref 70–99)

## 2018-07-20 LAB — MAGNESIUM: MAGNESIUM: 2.6 mg/dL — AB (ref 1.7–2.4)

## 2018-07-20 LAB — PHOSPHORUS: PHOSPHORUS: 4.2 mg/dL (ref 2.5–4.6)

## 2018-07-20 MED ORDER — ORAL CARE MOUTH RINSE
15.0000 mL | Freq: Two times a day (BID) | OROMUCOSAL | Status: DC
  Administered 2018-07-20 – 2018-07-25 (×11): 15 mL via OROMUCOSAL

## 2018-07-20 MED ORDER — SODIUM CHLORIDE 0.9% FLUSH
5.0000 mL | Freq: Three times a day (TID) | INTRAVENOUS | Status: DC
  Administered 2018-07-20 – 2018-07-29 (×23): 5 mL

## 2018-07-20 MED ORDER — LIDOCAINE HCL 1 % IJ SOLN
INTRAMUSCULAR | Status: AC
  Filled 2018-07-20: qty 20

## 2018-07-20 MED ORDER — MIDAZOLAM HCL 2 MG/2ML IJ SOLN
INTRAMUSCULAR | Status: AC | PRN
  Administered 2018-07-20 (×2): 1 mg via INTRAVENOUS
  Administered 2018-07-20 (×2): 0.5 mg via INTRAVENOUS
  Administered 2018-07-20: 1 mg via INTRAVENOUS

## 2018-07-20 MED ORDER — CHLORHEXIDINE GLUCONATE 0.12 % MT SOLN
15.0000 mL | Freq: Two times a day (BID) | OROMUCOSAL | Status: DC
  Administered 2018-07-20 – 2018-07-25 (×11): 15 mL via OROMUCOSAL
  Filled 2018-07-20 (×11): qty 15

## 2018-07-20 MED ORDER — TRAVASOL 10 % IV SOLN
INTRAVENOUS | Status: AC
  Administered 2018-07-20: 18:00:00 via INTRAVENOUS
  Filled 2018-07-20: qty 1320

## 2018-07-20 MED ORDER — MIDAZOLAM HCL 2 MG/2ML IJ SOLN
INTRAMUSCULAR | Status: AC
  Filled 2018-07-20: qty 4

## 2018-07-20 MED ORDER — FENTANYL CITRATE (PF) 100 MCG/2ML IJ SOLN
INTRAMUSCULAR | Status: AC | PRN
  Administered 2018-07-20 (×2): 25 ug via INTRAVENOUS
  Administered 2018-07-20: 50 ug via INTRAVENOUS

## 2018-07-20 MED ORDER — FENTANYL CITRATE (PF) 100 MCG/2ML IJ SOLN
INTRAMUSCULAR | Status: AC
  Filled 2018-07-20: qty 4

## 2018-07-20 MED ORDER — HEPARIN SODIUM (PORCINE) 5000 UNIT/ML IJ SOLN
5000.0000 [IU] | Freq: Three times a day (TID) | INTRAMUSCULAR | Status: AC
  Administered 2018-07-20: 5000 [IU] via SUBCUTANEOUS
  Filled 2018-07-20: qty 1

## 2018-07-20 NOTE — Progress Notes (Signed)
9 Days Post-Op    CC:  Perforated colon  Subjective: He is awake and alert today, Ostomy is full of green fluid.  His midline wound with vac was changed and he is a little soupy, and one area that just looks like hematoma.  NG was in the left bronchus so it was removed.  Objective: Vital signs in last 24 hours: Temp:  [98.5 F (36.9 C)-100.1 F (37.8 C)] 98.7 F (37.1 C) (10/18 0415) Pulse Rate:  [95-108] 95 (10/18 0415) Resp:  [18-37] 33 (10/18 0415) BP: (112-129)/(76-82) 112/76 (10/18 0415) SpO2:  [97 %-98 %] 97 % (10/18 0415) Weight:  [112.7 kg] 112.7 kg (10/18 0500) Last BM Date: 07/20/18 NPO TNA 1642 Urine 2375 Afebrile, Tachycardic Na 152, cl 117 Glucose 2.73 Creatinine is 3.66 Mag 2.6 WBC not repeated  CT scan yesterday show 3 fluid collections and we are going to ask IR to review, The one near the spleen looks more like hematoma to Dr. Rosendo Gros and Hulen Skains. Film this AM:  Malpositioned enteric tube is in the airway, left lower lobe Bronchus.  Patchy left lung base opacity is stable since the CT yesterday due to combined pleural fluid and airspace disease.  Intake/Output from previous day: 10/17 0701 - 10/18 0700 In: 1642.1 [I.V.:1525; IV Piggyback:117.1] Out: 3031 [Urine:2375; Emesis/NG output:500; Drains:156] Intake/Output this shift: No intake/output data recorded.  General appearance: alert, cooperative and no distress Resp: clear to auscultation bilaterally GI: open wound ostomy is working.  The wound is a little soupy. Switching to wet to dry for the weekend.   Lab Results:  Recent Labs    07/19/18 0009  WBC 17.8*  HGB 9.6*  HCT 29.8*  PLT 400    BMET Recent Labs    07/19/18 0009 07/20/18 0342  NA 152* 152*  K 3.8 4.3  CL 115* 117*  CO2 22 20*  GLUCOSE 243* 273*  BUN 74* 85*  CREATININE 3.41* 3.66*  CALCIUM 9.3 9.5   PT/INR No results for input(s): LABPROT, INR in the last 72 hours.  Recent Labs  Lab 07/14/18 0432 07/17/18 0346  07/19/18 0009  AST 49* 73* 137*  ALT 37 40 94*  ALKPHOS 61 84 119  BILITOT 0.8 3.0* 1.9*  PROT 6.3* 7.6 7.8  ALBUMIN 2.1* 2.8* 2.5*     Lipase  No results found for: LIPASE   Medications: . chlorhexidine  15 mL Mouth Rinse BID  . heparin injection (subcutaneous)  5,000 Units Subcutaneous Q8H  . insulin aspart  0-20 Units Subcutaneous Q4H  . 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 Rolm Bookbinder POD# 9            Post-op ileus              Continue NGT to LIWS - 500  - 24h              Continue JP             Ambulate/IS   FEN: ice chips - sips of  clears/ IV fluids/TNA ; correction of hypernatremia and hyperglycemia per primary team.   ID: Zosyn 10/8 =>>day9; Cefotetan pre op  DVT: SCD/subcutaneous heparin Medicine: Dr. Evalee Mutton Ghimire/CCM: Dr. Darlys Gales Foley: none Follow up: Dr. Donne Hazel    Plan:  Ask IR to review and see if any of these intraabdominal sites are drainable.  Wet to dry dressing to open abdomen this weekend.       LOS: 10 days    Viviene Thurston 07/20/2018 [phone]

## 2018-07-20 NOTE — Discharge Instructions (Signed)
Mechanical Wound Debridement Wet to dry dressing to open sites using wet  with sterile saline twice a day. Mechanical wound debridement is a treatment to remove dead tissue from a wound. This helps the wound heal. The treatment involves cleaning the wound (irrigation) and using a pad or gauze (dressing) to remove dead tissue and debris from the wound. There are different types of mechanical wound debridement. Depending on the wound, you may need to repeat this procedure or change to another form of debridement as your wound starts to heal. Tell a health care provider about:  Any allergies you have.  All medicines you are taking, including vitamins, herbs, eye drops, creams, and over-the-counter medicines.  Any blood disorders you have.  Any medical conditions you have, including any conditions that: ? Cause a significant decrease in blood circulation to the part of the body where the wound is, such as peripheral vascular disease. ? Compromise your defense (immune) system or white blood count.  Any surgeries you have had.  Whether you are pregnant or may be pregnant. What are the risks? Generally, this is a safe procedure. However, problems may occur, including:  Infection.  Bleeding.  Damage to healthy tissue in and around your wound.  Soreness or pain.  Failure of the wound to heal.  Scarring.  What happens before the procedure? You may be given antibiotic medicine to help prevent infection. What happens during the procedure?  Your health care provider may apply a numbing medicine (topical anesthetic) to the wound.  Your health care provider will irrigate your wound with a germ-free (sterile), salt-water (saline) solution. This removes debris, bacteria, and dead tissue.  Depending on what type of mechanical wound debridement you are having, your health care provider may do one of the following: ? Put a dressing on your wound. You may have dry gauze pad placed into the  wound. Your health care provider will remove the gauze after the wound is dry. Any dead tissue and debris that has dried into the gauze will be lifted out of the wound (wet-to-dry debridement). ? Use a type of pad (monofilament fiber debridement pad). This pad has a fluffy surface on one side that picks up dead tissue and debris from your wound. Your health care provider wets the pad and wipes it over your wound for several minutes. ? Irrigate your wound with a pressurized stream of solution such as saline or water.  Once your health care provider is finished, he or she may apply a light dressing to your wound. The procedure may vary among health care providers and hospitals. What happens after the procedure?  You may receive medicine for pain.  You will continue to receive antibiotic medicine if it was started before your procedure. This information is not intended to replace advice given to you by your health care provider. Make sure you discuss any questions you have with your health care provider. Document Released: 06/10/2015 Document Revised: 02/25/2016 Document Reviewed: 01/28/2015 Elsevier Interactive Patient Education  [employer].

## 2018-07-20 NOTE — Progress Notes (Signed)
Both blue and colorless NG ports flushed with 120 ml of air per MD orders by Ronalee Belts, RN, observed by this RN. Patient belched, tolerated well. No signs of pain or nausea during treatment. No complaints at this time.

## 2018-07-20 NOTE — Progress Notes (Signed)
PROGRESS NOTE    Jerry Green  EPP:[id number] DOB: [birth year] DOA: 07/29/2018 PCP: Ollen Bowl, MD  Brief Narrative:  62 year old with past medical history relevant for hypertension and type 2 diabetes transferred on 07/03/2018 from outside hospital after being found to have acutely perforated large obstructing carcinoma of the splenic flexure with lymphatic and peritoneal and liver metastases as well as invasion of the left kidney.  His course was complicated by acute respiratory failure postoperatively as well as postop ileus and encephalopathy.   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 07/11/2018 Active Problems:   Bowel obstruction (HCC)   Peritoneal carcinomatosis (HCC)   Essential hypertension   GERD (gastroesophageal reflux disease)   Leukocytosis   Cancer of splenic flexure of colon pT4b, pN1b M1   Liver metastasis from colon   Ileus, postoperative ([place])   Colostomy in place [facility])   #) Encephalopathy: Unclear etiology.  It is unclear if this is related to medication or underlying infection.  He is currently being evaluated for possible drain for fluid collections noted on CT.  He continues IV antibiotics.  Will consider neuroimaging if his altered encephalopathy persists. -Noncontrast head CT ordered -MRI brain if encephalopathy persists  #) Perforated colon cancer of splenic fracture status post hemicolectomy and ostomy complicated by fluid collections: CT scan 07/19/2018 for fluid collections in the abdomen showed evidence of multiple fluid collections.  Patient is noted to have persistently elevated white blood cell count.  He is also had low-grade temperatures. - Interventional radiology consulted for possible drain -General surgery following, appreciate recommendations -N.p.o. pending return of bowel function, will be managed by general surgery - NG tube being managed by general surgery -Continue  TPN per pharmacy, TPN labs  #) Adenocarcinoma of the colon: -Oncology following, will follow up likely as an outpatient  #) Hypertension/hyperlipidemia: -Hold simvastatin 40 mg daily -Hold enalapril 5 mg daily  #) Gout: -Hold allopurinol 100 mg daily  #) Type 2 diabetes: -Hold glimepiride 2 mg twice daily -Hold metformin thousand milligrams twice daily  Fluids: TPN Elect lites: TPN Nutrition: TPN  Prophylaxis: SCDs  Disposition: Pending return of bowel function and return to mental status  Full code    Consultants:   Oncology  General surgery  PCCM  Procedures:  07/11/2018 ex lap, left colectomy, biopsy of liver lesion  07/16/2018 P ICC placed, TNA initiated on 07/16/2018  Antimicrobials:   IV Zosyn started 07/09/2018 to ongoing   Subjective: This morning patient does not have any complaints but he is somewhat confused.  He seems to be giggling a great deal for no reason.  He sometimes tends to go off on tangents and does not clearly answer the question.  He is only minimally alert and oriented.  He does not have any focal deficits.  Objective: Vitals:   07/20/18 0040 07/20/18 0055 07/20/18 0415 07/20/18 0500  BP:  129/78 112/76   Pulse: (!) 101 (!) 101 95   Resp: (!) 37 (!) 33 (!) 33   Temp:  99.9 F (37.7 C) 98.7 F (37.1 C)   TempSrc:  Oral Axillary   SpO2: 97% 97% 97%   Weight:    112.7 kg  Height:        Intake/Output Summary (Last 24 hours) at 07/20/2018 1526 Last data filed at 07/20/2018 0657 Gross per 24 hour  Intake 1642.09 ml  Output 1481 ml  Net 161.09 ml   [employer]  07/18/18 1700 07/19/18 0500 07/20/18 0500  Weight: 111.6 kg 113.5 kg 112.7 kg    Examination:  General exam: Mildly agitated Respiratory system: Clear to auscultation. Respiratory effort normal. Cardiovascular system: Regular rate and rhythm, no murmurs Gastrointestinal system: Diminished bowel sounds, no rebound or guarding, mild tenderness to deep  palpation. Central nervous system: Alert and oriented to person and place but not to time or situation.  Grossly moving all extremities. Extremities: Trace lower extremity edema Skin: Well-healed midline incision with wound VAC in place, ostomy site is clean dry and intact, Psychiatry: Unable to assess due to medical condition    Data Reviewed: I have personally reviewed following labs and imaging studies  CBC: Recent Labs  Lab 07/14/18 0432 07/15/18 0232 07/15/18 1729 07/16/18 0410 07/17/18 0346 07/19/18 0009  WBC 15.3* 13.5*  --  14.9* 16.2* 17.8*  NEUTROABS  --  10.2*  --  11.3* 11.7*  --   HGB 8.4* 7.2* 8.4* 8.3* 8.8* 9.6*  HCT 26.7* 22.8* 26.2* 26.3* 27.6* 29.8*  MCV 89.3 89.8  --  88.9 88.7 88.7  PLT 324 324  --  258 318 301   Basic Metabolic Panel: Recent Labs  Lab 07/17/18 0346 07/17/18 1439 07/18/18 0254 07/18/18 0427 07/19/18 0009 07/19/18 0319 07/20/18 0342  NA 150*  --  146* 153* 152*  --  152*  K 2.9* 3.2* 6.4* 3.4* 3.8  --  4.3  CL 117*  --  113* 118* 115*  --  117*  CO2 22  --  21* 22 22  --  20*  GLUCOSE 174*  --  669* 189* 243*  --  273*  BUN 61*  --  65* 67* 74*  --  85*  CREATININE 3.23*  --  3.06* 3.26* 3.41*  --  3.66*  CALCIUM 9.2  --  9.5 9.6 9.3  --  9.5  MG 2.6*  --  3.0* 2.8*  --  2.6* 2.6*  PHOS 3.6  --  5.5* 3.9  --  4.6 4.2   GFR: Estimated Creatinine Clearance: 26.8 mL/min (A) (by C-G formula based on SCr of 3.66 mg/dL (H)). Liver Function Tests: Recent Labs  Lab 07/14/18 0432 07/17/18 0346 07/19/18 0009  AST 49* 73* 137*  ALT 37 40 94*  ALKPHOS 61 84 119  BILITOT 0.8 3.0* 1.9*  PROT 6.3* 7.6 7.8  ALBUMIN 2.1* 2.8* 2.5*   No results for input(s): LIPASE, AMYLASE in the last 168 hours. No results for input(s): AMMONIA in the last 168 hours. Coagulation Profile: No results for input(s): INR, PROTIME in the last 168 hours. Cardiac Enzymes: No results for input(s): CKTOTAL, CKMB, CKMBINDEX, TROPONINI in the last 168  hours. BNP (last 3 results) No results for input(s): PROBNP in the last 8760 hours. HbA1C: No results for input(s): HGBA1C in the last 72 hours. CBG: Recent Labs  Lab 07/19/18 2110 07/20/18 0054 07/20/18 0457 07/20/18 0821 07/20/18 1244  GLUCAP 242* 305* 254* 264* 255*   Lipid Profile: No results for input(s): CHOL, HDL, LDLCALC, TRIG, CHOLHDL, LDLDIRECT in the last 72 hours. Thyroid Function Tests: No results for input(s): TSH, T4TOTAL, FREET4, T3FREE, THYROIDAB in the last 72 hours. Anemia Panel: No results for input(s): VITAMINB12, FOLATE, FERRITIN, TIBC, IRON, RETICCTPCT in the last 72 hours. Sepsis Labs: Recent Labs  Lab 07/14/18 0432  PROCALCITON 18.62    No results found for this or any previous visit (from the past 240 hour(s)).       Radiology Studies: Ct Abdomen Pelvis  Wo Contrast  Result Date: 07/19/2018 CLINICAL DATA:  Abdominal pain.  Evaluate for abscess. EXAM: CT ABDOMEN AND PELVIS WITHOUT CONTRAST TECHNIQUE: Multidetector CT imaging of the abdomen and pelvis was performed following the standard protocol without IV contrast. COMPARISON:  None. FINDINGS: Lower chest: There is a small to moderate left pleural effusion with left lower lobe airspace consolidation. Subsegmental atelectasis noted within the right lower lobe. Posterior left upper lobe pulmonary nodule measures 9 mm, image 3/4. Hepatobiliary: Gas, fluid and debris filled structure along the dome of the liver measures 5.2 by 3.0 by 3.0 cm. I cannot tell if this is intraparenchymal, subcapsular or between the dome of liver and right hemidiaphragm. Within the limitations of unenhanced technique no additional focal liver abnormalities identified. The gallbladder appears normal. No biliary dilatation. Pancreas: Unremarkable. No pancreatic ductal dilatation or surrounding inflammatory changes. Spleen: Normal in size without focal abnormality. Adrenals/Urinary Tract: Normal adrenal glands. Unremarkable  appearance of both kidneys. The urinary bladder appears normal. Stomach/Bowel: Nasogastric tube tip is in the body of stomach. The small bowel loops have a normal caliber. A right lower quadrant colostomy is identified. Long segment Hartmann's pouch is identified which begins at the level of the distal descending colon, image 55/3. Vascular/Lymphatic: Mild aortic atherosclerosis. No aneurysm. Mild retroperitoneal adenopathy identified. No enlarged pelvic lymph nodes. 1 cm left retroperitoneal lymph node is identified, image 42/3. Adjacent lymph node measures 1.1 cm, image 44/3. Reproductive: Prostate is unremarkable. Other: A surgical drainage catheter enters the left abdomen and terminates along the left pericolic gutter, image 44/0. There is a fluid collection within the left upper quadrant of the abdomen between the spleen and left hemidiaphragm which measures 12.4 cm in maximum dimension, image 75/6. The drainage catheter does not appear to access this fluid collection. a second possible fluid collection is identified within the pelvis between the anterior wall of rectum and posterior wall of bladder. This measures 5.6 x 3.1 by 5.5 cm. Scattered nodular densities are identified within the peritoneal cavity. The largest is in the left upper quadrant of the abdomen measuring 1.2 cm, image 50/6. Along the right lateral hemiabdomen there is a 7 mm peritoneal nodule, image 42/6. Musculoskeletal: Degenerative disc disease identified within the lower lumbar spine. IMPRESSION: 1. There are 3 fluid collections identified within the abdomen. The largest is in the left upper quadrant of the abdomen between the left hemidiaphragm and spleen. Within the pelvis there is a fluid collection between the anterior wall of rectum and posterior wall of bladder. Finally, along the dome of the right lobe of liver there is a gas, debris and fluid collection which may be within segment 7 of the liver or between the liver and right  hemidiaphragm. These are all incompletely characterized without IV contrast but may represent multiple abscesses. 2. Status post right abdominal colostomy with long segment Hartmann's pouch. No abnormal bowel dilatation identified. 3. Left lower lobe airspace consolidation and small pleural effusion. Cannot rule out pneumonia. 4. Mild retroperitoneal adenopathy. 5. Pulmonary nodule in the left lobe upper lobe measures 9 mm. In a patient that may be at increased risk for lung metastasis consider further evaluation with nonemergent contrast enhanced CT of the chest for staging purposes. 6. A few scattered peritoneal nodules are identified. Specificity somewhat diminished due to extensive postoperative changes within the abdomen. Clinical correlation for any surgical findings suggestive of peritoneal disease. Electronically Signed   By: Kerby Moors M.D.   On: 07/19/2018 16:50   Dg Abd Portable 1v  Addendum Date: 07/20/2018   ADDENDUM REPORT: 07/20/2018 07:34 ADDENDUM: Study discussed by telephone with Nurse Jolayne Haines on 07/20/2018 at 0731 hours. Electronically Signed   By: Genevie Ann M.D.   On: 07/20/2018 07:34   Result Date: 07/20/2018 CLINICAL DATA:  62 year old male enteric tube placed. EXAM: PORTABLE ABDOMEN - 1 VIEW COMPARISON:  CT Abdomen and Pelvis 07/19/2018. FINDINGS: Semi upright AP views of the chest and Portable AP supine view of the abdomen at 0646 hours. Enteric tube courses into the airway and into the left mainstem bronchus terminating at the lower aspect of the left hilum on both views. Right PICC line in place. Left abdominal drainage catheter remains in place. Streaky opacity at the left base seen to be a mix of pleural fluid and airspace disease appears stable since yesterday. The right lung is clear allowing for portable technique. Stable bowel gas pattern, nonobstructed. No acute osseous abnormality identified. IMPRESSION: 1. Malpositioned enteric tube is in the airway, left lower lobe  bronchus. 2. Patchy left lung base opacity is stable since the CT yesterday due to combined pleural fluid and airspace disease. 3. Eight abdominal drainage catheter and stable bowel gas pattern. Electronically Signed: By: Genevie Ann M.D. On: 07/20/2018 07:23        Scheduled Meds: . chlorhexidine  15 mL Mouth Rinse BID  . heparin injection (subcutaneous)  5,000 Units Subcutaneous Q8H  . insulin aspart  0-20 Units Subcutaneous Q4H  . lidocaine      . mouth rinse  15 mL Mouth Rinse q12n4p   Continuous Infusions: . sodium chloride 75 mL/hr at 07/19/18 1505  . dextrose Stopped (07/19/18 1146)  . piperacillin-tazobactam (ZOSYN)  IV 3.375 g (07/20/18 0844)  . TPN ADULT (ION) 100 mL/hr at 07/19/18 1710  . TPN ADULT (ION)       LOS: 10 days    Time spent: [place], MD [facility]  If 7PM-7AM, please contact night-coverage [email or web address] Password [email or web address] 07/20/2018, 3:26 PM

## 2018-07-20 NOTE — Sedation Documentation (Signed)
Patient is resting comfortably. 

## 2018-07-20 NOTE — Consult Note (Signed)
[place] Nurse wound follow up Wound type:Midline abdominal surgical wound.  Decline in wound, discontinue NPWT and NS moist gauze dressing until Monday. Measurement: 24 cm x 5 cm unable to visualize deepest part of wound due to presence of devitalized tissue.  Gelatinous bloody tissue noted at proximal end with yellow purulent drainage noted at distal end.  CCS PA, Will called to bedside.  We will switch to NS moist gauze over the weekend and evaluate Monday.  Wound bed: 60% beefy red, 40% devitalized tissue Drainage (amount, consistency, odor) moderate serosanguinous with purulence at distal end  Periwound:Intact with RUQ colostomy Dressing procedure/placement/frequency: Cleanse abdominal wound with NS and pat dry.  Fill wound depth with NS moist gauze.  Cover with ABD pad and tape.  Change twice daily.  [place] Nurse ostomy follow up RUQ pouch is changed today.  Barrier cut off center to accommodate midline abdominal dressing. Liquid green output noted in pouch today.  [place] team will follow.  Domenic Moras MSN, RN, FNP-BC CWON Wound, Ostomy, Continence Nurse Pager [phone]

## 2018-07-20 NOTE — Procedures (Signed)
Perisplenic abscess drain 10 Fr Hepatic abscess drain 10 Fr  EBL 0 Comp 0

## 2018-07-20 NOTE — Progress Notes (Signed)
On arrival to room by Elmyra Ricks, RN, patient's NG tube was noted to be approximately halfway out from original position.  Patient was coughing and voice sounded hoarse.  Lungs sounded clear on auscultation.  This nurse was able to advance the NG tube back to the original marking up to the nare.  Attempted to verify placement by auscultation; Elmyra Ricks and Theme park manager were unable to confirm placement.  Paged [facility] provider K. Schorr, who ordered a STAT portable chest x-ray to confirm placement.  Dayshift RN informed of situation.

## 2018-07-20 NOTE — Progress Notes (Signed)
PHARMACY - ADULT TOTAL PARENTERAL NUTRITION CONSULT NOTE   Pharmacy Consult for TPN Indication: prolonged ileus  Patient Measurements: Height: 6' (182.9 cm) Weight: 248 lb 7.3 oz (112.7 kg) IBW/kg (Calculated) : 77.6 TPN AdjBW (KG): 86.1 Body mass index is 33.7 kg/m.  Assessment:  58 yom transferred from OSH with abdominal pain, N/V and found to have an acute perforated/obstructing large carcinoma in the colon with multiple metastases. S/p OR 10/9 for exlap and liver biopsy (positive) with colectomy. Attempted TF but with almost immediate vomiting. Started TPN for nutrition support. Pt is at risk for refeeding.   GI: s/p colectomy with going ileus -  166mL drain OP, NG OP 1100>581mL. Last stool/BM noted 10/18. Famotidine in TPN  Endo: Hx DM - CBGs now uncontrolled (202-305/24h) and 242-305 with last bag rate increase and on D5W infusion.. Insulin requirements since 1800: 54 units  Lytes: K 3.8>4.3, Mg 2.6, Phos WNL, Corr Ca 10.7, Na 152 (D5 at 31ml/hr), Cl 117 Renal: ARI with  Scr trending up to 3.66, UOP 0.9 (I/O -2.5L)  Pulm: (extubated 10/13) RA; PRN albuterol. RR noted up to 37 Cards: h/o HTN:  VSS AC: sq heparin; HgB 9.6, PLT wnl Hepatobil: LFTs trending up slightly, albumin 2.5, tbili down to 1.9, prealbumin 13.2>>12.2, TG 274 Neuro: Transient confusion ID: Zosyn postop for gross contamination with stool perforated colon - Afebrile, WBC 17.8  TPN Access: CVC TPN start date: 07/16/18 Nutritional Goals (per RD rec on 10/14): KCal: 2400-2600 KCal Protein: 120-135 g  Fluid: >/= 2L/day  Goal TPN rate is 100 ml/hr (provides 132g protein and 2472 KCal - 100% goal)  Current Nutrition:  NPO TPN  Plan:  Increase TPN to goal rate of 100 mL/hr since electrolytes have stabilized This TPN provides 132 g of protein, 360 g of dextrose, and 72 g of lipids which provides 2472 kCals per day, meeting 100% of patient needs  Electrolytes in TPN: Removed sodium, remove magnesium, remove  calcium, maximize acetate.  Daily MVI, trace elements, Pepcid 20 mg to TPN  Change to rSSI and adjust as needed Increase  insulin to 40 units in TPN  Monitor TPN labs, CBG, insulin requirements, and triglycerides    Rodrigo Mcgranahan S. Alford Highland, PharmD, [facility] Clinical Staff Pharmacist [phone]  07/20/2018 7:36 AM

## 2018-07-20 NOTE — Progress Notes (Signed)
Flushed the blue port and clear port each with 120cc air, per MD order.  Low intermittent suction resumed.  Pt tolerated procedure well.

## 2018-07-20 NOTE — Consult Note (Signed)
Chief Complaint: Patient was seen in consultation today for abdominal abscess drains  Referring Physician(s): Modena Jansky, PA-C  Supervising Physician: Marybelle Killings  Patient Status: Jerry Green - In-pt  History of Present Illness: Jerry Green is a 62 y.o. male with a past medical history significant for DM II, GERD, HTN. Patient initially presented to [facility]) with complaints of progressive RLQ abdominal pain over previous 3-4 days with nausea, vomiting, weight loss and decreased appetite. He was found to have an acute bowel obstruction with large obstructing mass of the splenic flexure of the colon, perforation of the colon, peritoneal carcinomatosis as well as suspected liver and lymphatic metastases. He underwent exploratory laparotomy with left colectomy and liver biopsy on 10/9 with Dr. Donne Hazel. 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 Liver pathology was also positive for metastatic adenocarcinoma.   Patient subsequently developed worsening leukocytosis during his hospital course and CT abdomen/pelvis was ordered on 10/17 to assess for abscesses - 3 fluid collections were identified within the abdomen, the largest in the left upper quadrant of the abdomen between the left hemidiaphragm and spleen, fluid in the pelvis between the anterior wall of the rectum and posterior wall of the bladder and along the dome of right lobe of the liver there is gas, debris and fluid collection. Request was made to IR for aspiration and drainage of abscesses.  Patient very somnolent on exam - able to tell me his name and that he is in the hospital, he is unable to tell me what hospital, city or state he is in. He falls asleep several times during our conversation and repeatedly states "I don't got no abscesses." When asked how he is feeling he laughs.   Spoke with patient's  daughter Jerry Green on the phone today to discuss indication, risks and benefits of abdominal drain placement - she is in agreement with plan and asks that she can be updated with his condition later in the day by his nurse.  Past Medical History:  Diagnosis Date  . Diabetes mellitus type II, controlled ([place])   . GERD (gastroesophageal reflux disease)   . HTN (hypertension)     Past Surgical History:  Procedure Laterality Date  . APPLICATION OF WOUND VAC N/A 08/01/2018   Procedure: APPLICATION OF WOUND VAC;  Surgeon: Rolm Bookbinder, MD;  Location: [place];  Service: General;  Laterality: N/A;  . COLON RESECTION N/A 07/13/2018   Procedure: PARTIAL COLON RESECTION WITH COLOSTOMY;  Surgeon: Rolm Bookbinder, MD;  Location: [place];  Service: General;  Laterality: N/A;  . LAPAROTOMY N/A 07/14/2018   Procedure: EXPLORATORY LAPAROTOMY;  Surgeon: Rolm Bookbinder, MD;  Location: [place];  Service: General;  Laterality: N/A;  . LIVER BIOPSY N/A 07/27/2018   Procedure: LIVER BIOPSY;  Surgeon: Rolm Bookbinder, MD;  Location: [place];  Service: General;  Laterality: N/A;    Allergies: Aspirin  Medications: Prior to Admission medications   Medication Sig Start Date End Date Taking? Authorizing Provider  allopurinol (ZYLOPRIM) 100 MG tablet Take 100 mg by mouth daily. 06/14/18  Yes [provider]  enalapril (VASOTEC) 5 MG tablet Take 5 mg by mouth daily. 06/14/18  Yes [provider]  glimepiride (AMARYL) 2 MG tablet Take 2 mg by mouth 2 (two) times daily. 07/04/18  Yes [provider]  metFORMIN (GLUCOPHAGE) 1000 MG tablet Take 1,000 mg by mouth 2 (two) times daily. 06/14/18  Yes [provider]  pantoprazole (PROTONIX) 40 MG tablet Take 40 mg by mouth daily. 06/14/18  Yes [provider]  simvastatin (ZOCOR) 40 MG tablet Take 40 mg by mouth daily. 06/14/18  Yes [provider]     Family History  Problem Relation Age of Onset  . Diabetes  Maternal Grandmother     Social History   Socioeconomic History  . Marital status: Divorced    Spouse name: Not on file  . Number of children: Not on file  . Years of education: Not on file  . Highest education level: Not on file  Occupational History  . Not on file  Social Needs  . Financial resource strain: Not on file  . Food insecurity:    Worry: Not on file    Inability: Not on file  . Transportation needs:    Medical: Not on file    Non-medical: Not on file  Tobacco Use  . Smoking status: Never Smoker  . Smokeless tobacco: Never Used  Substance and Sexual Activity  . Alcohol use: Not Currently    Frequency: Never  . Drug use: Never  . Sexual activity: Not on file  Lifestyle  . Physical activity:    Days per week: Not on file    Minutes per session: Not on file  . Stress: Not on file  Relationships  . Social connections:    Talks on phone: Not on file    Gets together: Not on file    Attends religious service: Not on file    Active member of club or organization: Not on file    Attends meetings of clubs or organizations: Not on file    Relationship status: Not on file  Other Topics Concern  . Not on file  Social History Narrative  . Not on file     Review of Systems: A 12 point ROS discussed and pertinent positives are indicated in the HPI above.  All other systems are negative.  Review of Systems  Unable to perform ROS: Mental status change    Vital Signs: BP 112/76 (BP Location: Left Arm)   Pulse 95   Temp 98.7 F (37.1 C) (Axillary)   Resp (!) 33   Ht 6' (1.829 m)   Wt 248 lb 7.3 oz (112.7 kg)   SpO2 97%   BMI 33.70 kg/m   Physical Exam  Constitutional: No distress.  Cardiovascular: Normal rate, regular rhythm and normal heart sounds.  Pulmonary/Chest: Effort normal.  Decreased breath sounds bilaterally - limited exam due to patient poor compliance  Abdominal: Soft. There is tenderness (diffuse).  Midline open wound with wound vac and  colostomy present  Neurological:  Somnolent, arouses briefly to verbal commands but then falls back asleep. Able to state his name and that he is in the hospital - unable to state which hospital, city or state.  Skin: Skin is warm and dry. He is not diaphoretic.  Psychiatric: Cognition and memory are impaired. He expresses inappropriate judgment.  Nursing note and vitals reviewed.   MD Evaluation Airway: WNL Heart: WNL Abdomen: WNL Chest/ Lungs: WNL ASA  Classification: 3 Mallampati/Airway Score: Two   Imaging: Ct Abdomen Pelvis Wo Contrast  Result Date: 07/19/2018 CLINICAL DATA:  Abdominal pain.  Evaluate for abscess. EXAM: CT ABDOMEN AND PELVIS WITHOUT CONTRAST TECHNIQUE: Multidetector CT imaging of the abdomen and pelvis was performed following the standard protocol without IV contrast. COMPARISON:  None. FINDINGS: Lower chest: There is a small to moderate  left pleural effusion with left lower lobe airspace consolidation. Subsegmental atelectasis noted within the right lower lobe. Posterior left upper lobe pulmonary nodule measures 9 mm, image 3/4. Hepatobiliary: Gas, fluid and debris filled structure along the dome of the liver measures 5.2 by 3.0 by 3.0 cm. I cannot tell if this is intraparenchymal, subcapsular or between the dome of liver and right hemidiaphragm. Within the limitations of unenhanced technique no additional focal liver abnormalities identified. The gallbladder appears normal. No biliary dilatation. Pancreas: Unremarkable. No pancreatic ductal dilatation or surrounding inflammatory changes. Spleen: Normal in size without focal abnormality. Adrenals/Urinary Tract: Normal adrenal glands. Unremarkable appearance of both kidneys. The urinary bladder appears normal. Stomach/Bowel: Nasogastric tube tip is in the body of stomach. The small bowel loops have a normal caliber. A right lower quadrant colostomy is identified. Long segment Hartmann's pouch is identified which begins at  the level of the distal descending colon, image 55/3. Vascular/Lymphatic: Mild aortic atherosclerosis. No aneurysm. Mild retroperitoneal adenopathy identified. No enlarged pelvic lymph nodes. 1 cm left retroperitoneal lymph node is identified, image 42/3. Adjacent lymph node measures 1.1 cm, image 44/3. Reproductive: Prostate is unremarkable. Other: A surgical drainage catheter enters the left abdomen and terminates along the left pericolic gutter, image 35/5. There is a fluid collection within the left upper quadrant of the abdomen between the spleen and left hemidiaphragm which measures 12.4 cm in maximum dimension, image 75/6. The drainage catheter does not appear to access this fluid collection. a second possible fluid collection is identified within the pelvis between the anterior wall of rectum and posterior wall of bladder. This measures 5.6 x 3.1 by 5.5 cm. Scattered nodular densities are identified within the peritoneal cavity. The largest is in the left upper quadrant of the abdomen measuring 1.2 cm, image 50/6. Along the right lateral hemiabdomen there is a 7 mm peritoneal nodule, image 42/6. Musculoskeletal: Degenerative disc disease identified within the lower lumbar spine. IMPRESSION: 1. There are 3 fluid collections identified within the abdomen. The largest is in the left upper quadrant of the abdomen between the left hemidiaphragm and spleen. Within the pelvis there is a fluid collection between the anterior wall of rectum and posterior wall of bladder. Finally, along the dome of the right lobe of liver there is a gas, debris and fluid collection which may be within segment 7 of the liver or between the liver and right hemidiaphragm. These are all incompletely characterized without IV contrast but may represent multiple abscesses. 2. Status post right abdominal colostomy with long segment Hartmann's pouch. No abnormal bowel dilatation identified. 3. Left lower lobe airspace consolidation and small  pleural effusion. Cannot rule out pneumonia. 4. Mild retroperitoneal adenopathy. 5. Pulmonary nodule in the left lobe upper lobe measures 9 mm. In a patient that may be at increased risk for lung metastasis consider further evaluation with nonemergent contrast enhanced CT of the chest for staging purposes. 6. A few scattered peritoneal nodules are identified. Specificity somewhat diminished due to extensive postoperative changes within the abdomen. Clinical correlation for any surgical findings suggestive of peritoneal disease. Electronically Signed   By: Kerby Moors M.D.   On: 07/19/2018 16:50   Dg Chest 2 View  Result Date: 07/15/2018 CLINICAL DATA:  History of colon cancer abdominal pain EXAM: CHEST - 2 VIEW COMPARISON:  None. FINDINGS: Cardiac shadow is within normal limits. The lungs are well aerated bilaterally. A nodular appearing density is noted over the left mid to lower lung not well appreciated on the  lateral projection. This may be related to nipple shadow. Repeat frontal imaging with nipple markers is recommended. No other focal abnormality is noted. IMPRESSION: Question left nipple shadow.  Repeat imaging is recommended. Electronically Signed   By: Inez Catalina M.D.   On: 07/07/2018 10:00   Dg Abd 1 View  Result Date: 07/15/2018 CLINICAL DATA:  Nasogastric tube placement. EXAM: ABDOMEN - 1 VIEW COMPARISON:  Chest radiograph 1 day prior FINDINGS: Nasogastric terminates at the body of the stomach. Probable small left pleural effusion with adjacent left lower lobe airspace disease. Gas-filled small bowel loops in the upper left abdomen. No gross free intraperitoneal air. IMPRESSION: Nasogastric tube terminating at the body of the stomach. Electronically Signed   By: Abigail Miyamoto M.D.   On: 07/15/2018 08:18   Dg Chest Port 1 View  Result Date: 07/16/2018 CLINICAL DATA:  Shortness of breath. History of pleural effusion, peritoneal carcinomatosis EXAM: PORTABLE CHEST 1 VIEW COMPARISON:   Portable chest x-ray of July 14 2018 FINDINGS: The lungs are mildly hypoinflated. There is persistent increased density in the left mid and lower lung. Small left pleural effusion. The right lung is clear. The heart is normal in size. The hilar structures are prominent greatest on the left. The esophagogastric tube tip projects in the gastric cardia with the proximal port at the level of the GE junction. The right internal jugular venous catheter tip projects over the junction of the proximal and midportions of the SVC. The bony structures are unremarkable. IMPRESSION: Interval extubation of the trachea. Persistent bilateral hypoinflation. Left basilar atelectasis or pneumonia with small left pleural effusion, stable. The esophagogastric tube merits advancement by between 5 and 10 cm to assure that the proximal port is positioned below the GE junction. Electronically Signed   By: David  Martinique M.D.   On: 07/16/2018 07:32   Dg Chest Port 1 View  Result Date: 07/14/2018 CLINICAL DATA:  Respiratory failure. EXAM: PORTABLE CHEST 1 VIEW COMPARISON:  One-view chest x-ray 07/12/2018 FINDINGS: The heart size is normal. Endotracheal tube is stable position. The side port of the NG tube is in the distal esophagus. A right IJ line is stable. A left pleural effusion and basilar airspace disease is increasing. The right lung is clear. Lung volumes are low. IMPRESSION: 1. The side port of the NG tube is in the esophagus and could be advanced for more optimal positioning. 2. Support apparatus is otherwise stable. Satisfactory positioning of endotracheal tube. 3. Low lung volumes with progressive left pleural effusion and airspace disease, likely atelectasis. Infection is not excluded. Electronically Signed   By: San Morelle M.D.   On: 07/14/2018 08:26   Dg Chest Port 1 View  Result Date: 07/12/2018 CLINICAL DATA:  Endotracheal tube, recent exploratory laparotomy. EXAM: PORTABLE CHEST 1 VIEW COMPARISON:   07/28/2018. FINDINGS: Endotracheal tube terminates 4.9 cm above the carina. Nasogastric tube is followed into the stomach. Right IJ central line tip projects at the junction of the brachiocephalic veins. Heart size stable. Lungs are low in volume with minimal left basilar atelectasis. No airspace consolidation or pleural fluid. No pneumothorax. IMPRESSION: Low lung volumes with minimal left basilar atelectasis. Electronically Signed   By: Lorin Picket M.D.   On: 07/12/2018 10:48   Dg Chest Port 1 View  Result Date: 07/21/2018 CLINICAL DATA:  Intubated patient. Bowel obstruction, peritoneal carcinomatosis. EXAM: PORTABLE CHEST 1 VIEW COMPARISON:  PA and lateral chest x-ray of July 10, 2018 FINDINGS: The lungs are reasonably well inflated and clear.  A left-sided nodular density seen on yesterday's study is not evident today. The heart and pulmonary vascularity are normal. The mediastinum is normal in width. The endotracheal tube tip lies approximately 4 cm above the carina. The esophagogastric tube tip projects in the gastric cardia with the proximal port at or just above the GE junction. The right internal jugular venous catheter tip projects at the junction of the proximal and midportions of the SVC. IMPRESSION: Intubated patient. No acute cardiopulmonary abnormality. No free subdiaphragmatic gas collections are observed. Advancement of the nasogastric tube by 5-10 cm is needed to assure that the proximal port lies below the GE junction. Electronically Signed   By: David  Martinique M.D.   On: 07/24/2018 14:16   Dg Abd Portable 1v  Addendum Date: 07/20/2018   ADDENDUM REPORT: 07/20/2018 07:34 ADDENDUM: Study discussed by telephone with Nurse Jolayne Haines on 07/20/2018 at 0731 hours. Electronically Signed   By: Genevie Ann M.D.   On: 07/20/2018 07:34   Result Date: 07/20/2018 CLINICAL DATA:  62 year old male enteric tube placed. EXAM: PORTABLE ABDOMEN - 1 VIEW COMPARISON:  CT Abdomen and Pelvis 07/19/2018.  FINDINGS: Semi upright AP views of the chest and Portable AP supine view of the abdomen at 0646 hours. Enteric tube courses into the airway and into the left mainstem bronchus terminating at the lower aspect of the left hilum on both views. Right PICC line in place. Left abdominal drainage catheter remains in place. Streaky opacity at the left base seen to be a mix of pleural fluid and airspace disease appears stable since yesterday. The right lung is clear allowing for portable technique. Stable bowel gas pattern, nonobstructed. No acute osseous abnormality identified. IMPRESSION: 1. Malpositioned enteric tube is in the airway, left lower lobe bronchus. 2. Patchy left lung base opacity is stable since the CT yesterday due to combined pleural fluid and airspace disease. 3. Eight abdominal drainage catheter and stable bowel gas pattern. Electronically Signed: By: Genevie Ann M.D. On: 07/20/2018 07:23   [place] Ekg Site Rite  Result Date: 07/16/2018 If Site Rite image not attached, placement could not be confirmed due to current cardiac rhythm.   Labs:  CBC: Recent Labs    07/15/18 0232 07/15/18 1729 07/16/18 0410 07/17/18 0346 07/19/18 0009  WBC 13.5*  --  14.9* 16.2* 17.8*  HGB 7.2* 8.4* 8.3* 8.8* 9.6*  HCT 22.8* 26.2* 26.3* 27.6* 29.8*  PLT 324  --  258 318 400    COAGS: Recent Labs    07/06/2018 0701 07/06/2018 0748 07/29/2018 1402  INR 1.13  --  1.23  APTT  --  33  --     BMP: Recent Labs    07/18/18 0254 07/18/18 0427 07/19/18 0009 07/20/18 0342  NA 146* 153* 152* 152*  K 6.4* 3.4* 3.8 4.3  CL 113* 118* 115* 117*  CO2 21* 22 22 20*  GLUCOSE 669* 189* 243* 273*  BUN 65* 67* 74* 85*  CALCIUM 9.5 9.6 9.3 9.5  CREATININE 3.06* 3.26* 3.41* 3.66*  GFRNONAA 20* 19* 18* 16*  GFRAA 23* 22* 21* 19*    LIVER FUNCTION TESTS: Recent Labs    07/13/18 0440 07/14/18 0432 07/17/18 0346 07/19/18 0009  BILITOT 1.1 0.8 3.0* 1.9*  AST 49* 49* 73* 137*  ALT 50* 37 40 94*  ALKPHOS 50 61  84 119  PROT 6.3* 6.3* 7.6 7.8  ALBUMIN 2.5* 2.1* 2.8* 2.5*    TUMOR MARKERS: No results for input(s): AFPTM, CEA, CA199, CHROMGRNA in the  last 8760 hours.  Assessment and Plan:  Patient with perforated splenic flexure 2/2 colon cancer with large and small bowel obstruction s/p exploratory laparotomy with left colectomy and liver biopsy which pathology has confirmed to be metastatic disease. Patient developed leukocytosis, tachypnea, tachycardia and Tmax 100.1 - CT scan of abdomen showed 3 fluid collections in left upper quadrant between the hemidiaphragm and spleen, within the pelvis and along the dome of the right lobe of the liver.   Patient reviewed with Dr. Barbie Banner today who is agreeable to proceed with aspiration and drainage of fluid collections around liver and spleen. Pelvic fluid appears to be free fluid and does not appear to be amenable to drainage at this time.   Patient unable to consent to procedure due to mental status changes - consent was obtained via phone from daughter Abdulla Pooley. Indications, risks and benefits were reviewed with Ms. Ureta - all questions answered. She is agreeable for her father to proceed with abdominal drain placement today in IR.  Risks and benefits discussed with the patient including bleeding, infection, damage to adjacent structures, bowel perforation/fistula connection, and sepsis.  All of the patient's questions were answered, patient is agreeable to proceed.  Consent signed and in chart.  Thank you for this interesting consult.  I greatly enjoyed meeting Raimundo Corbit and look forward to participating in their care.  A copy of this report was sent to the requesting provider on this date.  Electronically Signed: Joaquim Nam, PA-C 07/20/2018, 3:01 PM   I spent a total of 40 Minutes in face to face in clinical consultation, greater than 50% of which was counseling/coordinating care for abdominal abscess drains.

## 2018-07-20 NOTE — Plan of Care (Signed)
  Problem: Nutrition: Goal: Adequate nutrition will be maintained Outcome: Progressing   

## 2018-07-21 ENCOUNTER — Encounter (HOSPITAL_COMMUNITY): Payer: Self-pay | Admitting: Radiology

## 2018-07-21 ENCOUNTER — Inpatient Hospital Stay (HOSPITAL_COMMUNITY): Payer: Medicaid - Out of State

## 2018-07-21 LAB — CBC
HCT: 31.1 % — ABNORMAL LOW (ref 39.0–52.0)
HEMATOCRIT: 32.2 % — AB (ref 39.0–52.0)
Hemoglobin: 9.8 g/dL — ABNORMAL LOW (ref 13.0–17.0)
Hemoglobin: 9.7 g/dL — ABNORMAL LOW (ref 13.0–17.0)
MCH: 28 pg (ref 26.0–34.0)
MCH: 28.7 pg (ref 26.0–34.0)
MCHC: 30.4 g/dL (ref 30.0–36.0)
MCHC: 31.2 g/dL (ref 30.0–36.0)
MCV: 92 fL (ref 80.0–100.0)
MCV: 92 fL (ref 80.0–100.0)
NRBC: 0 % (ref 0.0–0.2)
PLATELETS: 597 10*3/uL — AB (ref 150–400)
Platelets: 579 10*3/uL — ABNORMAL HIGH (ref 150–400)
RBC: 3.5 MIL/uL — ABNORMAL LOW (ref 4.22–5.81)
RBC: 3.38 MIL/uL — AB (ref 4.22–5.81)
RDW: 14.8 % (ref 11.5–15.5)
RDW: 14.8 % (ref 11.5–15.5)
WBC: 23.3 10*3/uL — AB (ref 4.0–10.5)
WBC: 22.9 10*3/uL — AB (ref 4.0–10.5)
nRBC: 0 % (ref 0.0–0.2)

## 2018-07-21 LAB — COMPREHENSIVE METABOLIC PANEL
ALK PHOS: 128 U/L — AB (ref 38–126)
ALT: 137 U/L — ABNORMAL HIGH (ref 0–44)
ANION GAP: 10 (ref 5–15)
AST: 148 U/L — ABNORMAL HIGH (ref 15–41)
Albumin: 2.4 g/dL — ABNORMAL LOW (ref 3.5–5.0)
BILIRUBIN TOTAL: 1.4 mg/dL — AB (ref 0.3–1.2)
BUN: 95 mg/dL — ABNORMAL HIGH (ref 8–23)
CALCIUM: 9 mg/dL (ref 8.9–10.3)
CO2: 20 mmol/L — ABNORMAL LOW (ref 22–32)
Chloride: 121 mmol/L — ABNORMAL HIGH (ref 98–111)
Creatinine, Ser: 4.33 mg/dL — ABNORMAL HIGH (ref 0.61–1.24)
GFR calc non Af Amer: 13 mL/min — ABNORMAL LOW (ref >60)
GFR, EST AFRICAN AMERICAN: 15 mL/min — AB (ref >60)
GLUCOSE: 305 mg/dL — AB (ref 70–99)
POTASSIUM: 4.5 mmol/L (ref 3.5–5.1)
Sodium: 151 mmol/L — ABNORMAL HIGH (ref 135–145)
TOTAL PROTEIN: 8.1 g/dL (ref 6.5–8.1)

## 2018-07-21 LAB — GLUCOSE, CAPILLARY
GLUCOSE-CAPILLARY: 262 mg/dL — AB (ref 70–99)
GLUCOSE-CAPILLARY: 274 mg/dL — AB (ref 70–99)
GLUCOSE-CAPILLARY: 277 mg/dL — AB (ref 70–99)
GLUCOSE-CAPILLARY: 295 mg/dL — AB (ref 70–99)
Glucose-Capillary: 242 mg/dL — ABNORMAL HIGH (ref 70–99)
Glucose-Capillary: 278 mg/dL — ABNORMAL HIGH (ref 70–99)
Glucose-Capillary: 288 mg/dL — ABNORMAL HIGH (ref 70–99)

## 2018-07-21 LAB — BASIC METABOLIC PANEL
BUN: 98 mg/dL — ABNORMAL HIGH (ref 8–23)
CO2: 19 mmol/L — ABNORMAL LOW (ref 22–32)
Chloride: 119 mmol/L — ABNORMAL HIGH (ref 98–111)
Creatinine, Ser: 4.19 mg/dL — ABNORMAL HIGH (ref 0.61–1.24)
Glucose, Bld: 326 mg/dL — ABNORMAL HIGH (ref 70–99)
Potassium: 4.5 mmol/L (ref 3.5–5.1)
Sodium: 153 mmol/L — ABNORMAL HIGH (ref 135–145)

## 2018-07-21 LAB — PROTIME-INR
INR: 1.5
PROTHROMBIN TIME: 18 s — AB (ref 11.4–15.2)

## 2018-07-21 LAB — BASIC METABOLIC PANEL WITH GFR
Anion gap: 15 (ref 5–15)
Calcium: 9.2 mg/dL (ref 8.9–10.3)
GFR calc Af Amer: 16 mL/min — ABNORMAL LOW (ref >60)
GFR calc non Af Amer: 14 mL/min — ABNORMAL LOW (ref >60)

## 2018-07-21 MED ORDER — TRAVASOL 10 % IV SOLN
INTRAVENOUS | Status: AC
  Administered 2018-07-21: 17:00:00 via INTRAVENOUS
  Filled 2018-07-21: qty 1320

## 2018-07-21 MED ORDER — INSULIN GLARGINE 100 UNIT/ML ~~LOC~~ SOLN
25.0000 [IU] | Freq: Two times a day (BID) | SUBCUTANEOUS | Status: DC
  Administered 2018-07-21 – 2018-07-22 (×3): 25 [IU] via SUBCUTANEOUS
  Filled 2018-07-21 (×3): qty 0.25

## 2018-07-21 MED ORDER — INSULIN GLARGINE 100 UNIT/ML ~~LOC~~ SOLN
30.0000 [IU] | Freq: Every day | SUBCUTANEOUS | Status: DC
  Filled 2018-07-21: qty 0.3

## 2018-07-21 MED ORDER — WHITE PETROLATUM EX OINT
TOPICAL_OINTMENT | CUTANEOUS | Status: AC
  Administered 2018-07-21: 1
  Filled 2018-07-21: qty 28.35

## 2018-07-21 MED ORDER — HEPARIN SODIUM (PORCINE) 5000 UNIT/ML IJ SOLN
5000.0000 [IU] | Freq: Three times a day (TID) | INTRAMUSCULAR | Status: DC
  Administered 2018-07-21 – 2018-08-04 (×44): 5000 [IU] via SUBCUTANEOUS
  Filled 2018-07-21 (×43): qty 1

## 2018-07-21 NOTE — Progress Notes (Signed)
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  His course was complicated by acute respiratory failure postoperatively as well as postop ileus and encephalopathy.   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 - Interventional radiology placed 2 drains on 07/20/2018 -General surgery following, appreciate recommendations -N.p.o. pending return of bowel function, will be managed by general surgery - NG tube being managed by general surgery -Continue TPN per pharmacy, [place] started 07/09/2018, if patient continues to have persistent fevers or concern for infection would consider broadening to possible gram-positive coverage but almost  certainly would consider additionally treating for occult fungal infection  #) Encephalopathy: Unclear if related to infections versus persistent infection versus critical illness. -Noncontrast head CT on 07/20/2018 unremarkable -We will consider MRI brain if encephalopathy persists  #) Adenocarcinoma of the colon: -Oncology following, will follow up likely as an outpatient  #) Hypertension/hyperlipidemia: -Hold simvastatin 40 mg daily -Hold enalapril 5 mg daily  #) Gout: -Hold allopurinol 100 mg daily  #) Type 2 diabetes: -Hold glimepiride 2 mg twice daily -Hold metformin thousand milligrams twice daily  Fluids: TPN Elect lites: TPN Nutrition: TPN  Prophylaxis: SCDs  Disposition: Pending return of bowel function and return to baseline mental status  Full code    Consultants:   Oncology  General surgery  PCCM  Procedures:  07/28/2018 ex lap, left colectomy, biopsy of liver lesion  07/16/2018 P ICC placed, TNA initiated on 07/16/2018  Antimicrobials:   IV Zosyn started 07/09/2018 to ongoing   Subjective: This morning patient is in a humorous mood.  He appears to be somewhat confused but is attempting to hide this by joking.  He can at least tell me that he is in the [facility].  He did have 2 drains placed yesterday however these do not seem to be bothering him a great deal.  He denies any nausea, vomiting, diarrhea, cough, congestion.  He cannot tell me if he has had a bowel movement or passed gas however his ostomy output seems to be good.  Objective: Vitals:   07/21/18 0035 07/21/18 0507 07/21/18 0824 07/21/18 0825  BP:    120/76  Pulse:   (!) 103 (!) 105  Resp:   (!) 34 (!) 27  Temp: 99.5 F (37.5 C) 98.9 F (37.2 C) 98.6 F (37 C)  TempSrc: Oral Oral Oral   SpO2:   97% 96%  Weight:      Height:        Intake/Output Summary (Last 24 hours) at 07/21/2018 1216 Last data filed at 07/21/2018 0619 Gross per 24 hour  Intake 1773.13 ml    Output 2110 ml  Net -336.87 ml   Filed Weights   07/18/18 1700 07/19/18 0500 07/20/18 0500  Weight: 111.6 kg 113.5 kg 112.7 kg    Examination:  General exam: Calm Respiratory system: Clear to auscultation. Respiratory effort normal. Cardiovascular system: Regular rate and rhythm, no murmurs Gastrointestinal system: Diminished bowel sounds, no rebound or guarding, mild tenderness to deep palpation. Central nervous system: Alert and oriented to person and place but not to time or situation.  Grossly moving all extremities. Extremities: Trace lower extremity edema Skin: Well-healed midline incision with wound VAC in place, ostomy site is clean dry and intact, percutaneous drains x2 noted over abdomen Psychiatry: Unable to assess due to medical condition    Data Reviewed: I have personally reviewed following labs and imaging studies  CBC: Recent Labs  Lab 07/15/18 0232  07/16/18 0410 07/17/18 0346 07/19/18 0009 07/21/18 0035 07/21/18 0352  WBC 13.5*  --  14.9* 16.2* 17.8* 23.3* 22.9*  NEUTROABS 10.2*  --  11.3* 11.7*  --   --   --   HGB 7.2*   < > 8.3* 8.8* 9.6* 9.8* 9.7*  HCT 22.8*   < > 26.3* 27.6* 29.8* 32.2* 31.1*  MCV 89.8  --  88.9 88.7 88.7 92.0 92.0  PLT 324  --  258 318 400 579* 597*   < > = values in this interval not displayed.   Basic Metabolic Panel: Recent Labs  Lab 07/17/18 0346  07/18/18 0254 07/18/18 0427 07/19/18 0009 07/19/18 0319 07/20/18 0342 07/21/18 0035 07/21/18 0352  NA 150*  --  146* 153* 152*  --  152* 151* 153*  K 2.9*   < > 6.4* 3.4* 3.8  --  4.3 4.5 4.5  CL 117*  --  113* 118* 115*  --  117* 121* 119*  CO2 22  --  21* 22 22  --  20* 20* 19*  GLUCOSE 174*  --  669* 189* 243*  --  273* 305* 326*  BUN 61*  --  65* 67* 74*  --  85* 95* 98*  CREATININE 3.23*  --  3.06* 3.26* 3.41*  --  3.66* 4.33* 4.19*  CALCIUM 9.2  --  9.5 9.6 9.3  --  9.5 9.0 9.2  MG 2.6*  --  3.0* 2.8*  --  2.6* 2.6*  --   --   PHOS 3.6  --  5.5* 3.9  --  4.6 4.2   --   --    < > = values in this interval not displayed.   GFR: Estimated Creatinine Clearance: 23.4 mL/min (A) (by C-G formula based on SCr of 4.19 mg/dL (H)). Liver Function Tests: Recent Labs  Lab 07/17/18 0346 07/19/18 0009 07/21/18 0035  AST 73* 137* 148*  ALT 40 94* 137*  ALKPHOS 84 119 128*  BILITOT 3.0* 1.9* 1.4*  PROT 7.6 7.8 8.1  ALBUMIN 2.8* 2.5* 2.4*   No results for input(s): LIPASE, AMYLASE in the last 168 hours. No results for input(s): AMMONIA in the last 168 hours. Coagulation Profile: Recent Labs  Lab 07/21/18 0352  INR 1.50   Cardiac Enzymes: No results for input(s): CKTOTAL, CKMB, CKMBINDEX, TROPONINI in the last 168 hours.  BNP (last 3 results) No results for input(s): PROBNP in the last 8760 hours. HbA1C: No results for input(s): HGBA1C in the last 72 hours. CBG: Recent Labs  Lab 07/20/18 1802 07/20/18 1949 07/21/18 0055 07/21/18 0510 07/21/18 0751  GLUCAP 256* 288* 274* 295* 277*   Lipid Profile: No results for input(s): CHOL, HDL, LDLCALC, TRIG, CHOLHDL, LDLDIRECT in the last 72 hours. Thyroid Function Tests: No results for input(s): TSH, T4TOTAL, FREET4, T3FREE, THYROIDAB in the last 72 hours. Anemia Panel: No results for input(s): VITAMINB12, FOLATE, FERRITIN, TIBC, IRON, RETICCTPCT in the last 72 hours. Sepsis Labs: No results for input(s): PROCALCITON, LATICACIDVEN in the last 168 hours.  Recent Results (from the past 240 hour(s))  Aerobic/Anaerobic Culture (surgical/deep wound)     Status: None (Preliminary result)   Collection Time: 07/20/18  5:43 PM  Result Value Ref Range Status   Specimen Description LIVER  Final   Special Requests NONE  Final   Gram Stain   Final    RARE WBC PRESENT, PREDOMINANTLY PMN NO ORGANISMS SEEN Performed at [facility], [address] [place], [place] [address]    Culture PENDING  Incomplete   Report Status PENDING  Incomplete         Radiology Studies: Ct Abdomen Pelvis Wo  Contrast  Result Date: 07/19/2018 CLINICAL DATA:  Abdominal pain.  Evaluate for abscess. EXAM: CT ABDOMEN AND PELVIS WITHOUT CONTRAST TECHNIQUE: Multidetector CT imaging of the abdomen and pelvis was performed following the standard protocol without IV contrast. COMPARISON:  None. FINDINGS: Lower chest: There is a small to moderate left pleural effusion with left lower lobe airspace consolidation. Subsegmental atelectasis noted within the right lower lobe. Posterior left upper lobe pulmonary nodule measures 9 mm, image 3/4. Hepatobiliary: Gas, fluid and debris filled structure along the dome of the liver measures 5.2 by 3.0 by 3.0 cm. I cannot tell if this is intraparenchymal, subcapsular or between the dome of liver and right hemidiaphragm. Within the limitations of unenhanced technique no additional focal liver abnormalities identified. The gallbladder appears normal. No biliary dilatation. Pancreas: Unremarkable. No pancreatic ductal dilatation or surrounding inflammatory changes. Spleen: Normal in size without focal abnormality. Adrenals/Urinary Tract: Normal adrenal glands. Unremarkable appearance of both kidneys. The urinary bladder appears normal. Stomach/Bowel: Nasogastric tube tip is in the body of stomach. The small bowel loops have a normal caliber. A right lower quadrant colostomy is identified. Long segment Hartmann's pouch is identified which begins at the level of the distal descending colon, image 55/3. Vascular/Lymphatic: Mild aortic atherosclerosis. No aneurysm. Mild retroperitoneal adenopathy identified. No enlarged pelvic lymph nodes. 1 cm left retroperitoneal lymph node is identified, image 42/3. Adjacent lymph node measures 1.1 cm, image 44/3. Reproductive: Prostate is unremarkable. Other: A surgical drainage catheter enters the left abdomen and terminates along the left pericolic gutter, image 93/2. There is a fluid collection within the left upper quadrant of the abdomen between the spleen  and left hemidiaphragm which measures 12.4 cm in maximum dimension, image 75/6. The drainage catheter does not appear to access this fluid collection. a second possible fluid collection is identified within the pelvis between the anterior wall of rectum and posterior wall of bladder. This measures 5.6 x 3.1 by 5.5 cm. Scattered nodular densities are identified within the peritoneal cavity. The largest is in the left upper quadrant of the abdomen measuring 1.2 cm, image 50/6. Along the right lateral hemiabdomen there is a 7 mm peritoneal nodule, image 42/6. Musculoskeletal: Degenerative disc disease identified  within the lower lumbar spine. IMPRESSION: 1. There are 3 fluid collections identified within the abdomen. The largest is in the left upper quadrant of the abdomen between the left hemidiaphragm and spleen. Within the pelvis there is a fluid collection between the anterior wall of rectum and posterior wall of bladder. Finally, along the dome of the right lobe of liver there is a gas, debris and fluid collection which may be within segment 7 of the liver or between the liver and right hemidiaphragm. These are all incompletely characterized without IV contrast but may represent multiple abscesses. 2. Status post right abdominal colostomy with long segment Hartmann's pouch. No abnormal bowel dilatation identified. 3. Left lower lobe airspace consolidation and small pleural effusion. Cannot rule out pneumonia. 4. Mild retroperitoneal adenopathy. 5. Pulmonary nodule in the left lobe upper lobe measures 9 mm. In a patient that may be at increased risk for lung metastasis consider further evaluation with nonemergent contrast enhanced CT of the chest for staging purposes. 6. A few scattered peritoneal nodules are identified. Specificity somewhat diminished due to extensive postoperative changes within the abdomen. Clinical correlation for any surgical findings suggestive of peritoneal disease. Electronically Signed    By: Kerby Moors M.D.   On: 07/19/2018 16:50   Ct Head Wo Contrast  Result Date: 07/21/2018 CLINICAL DATA:  62 y/o M; altered encephalopathy. History of hypertension and diabetes. EXAM: CT HEAD WITHOUT CONTRAST TECHNIQUE: Contiguous axial images were obtained from the base of the skull through the vertex without intravenous contrast. COMPARISON:  None. FINDINGS: Brain: No evidence of acute infarction, hemorrhage, hydrocephalus, extra-axial collection or mass lesion/mass effect. Nonspecific white matter hypodensities are compatible with mild chronic microvascular ischemic changes and there is mild volume loss of the brain for age. Vascular: Calcific atherosclerosis of carotid siphons. No hyperdense vessel identified. Skull: Normal. Negative for fracture or focal lesion. Sinuses/Orbits: Mild diffuse paranasal sinus mucosal thickening. Normal aeration of the mastoid air cells. Orbits are unremarkable. Other: None. IMPRESSION: 1. No acute intracranial abnormality identified. 2. Mild chronic microvascular ischemic changes and mild volume loss of the brain for age. Electronically Signed   By: Kristine Garbe M.D.   On: 07/21/2018 00:37   Dg Abd Portable 1v  Addendum Date: 07/20/2018   ADDENDUM REPORT: 07/20/2018 07:34 ADDENDUM: Study discussed by telephone with Nurse Jolayne Haines on 07/20/2018 at 0731 hours. Electronically Signed   By: Genevie Ann M.D.   On: 07/20/2018 07:34   Result Date: 07/20/2018 CLINICAL DATA:  62 year old male enteric tube placed. EXAM: PORTABLE ABDOMEN - 1 VIEW COMPARISON:  CT Abdomen and Pelvis 07/19/2018. FINDINGS: Semi upright AP views of the chest and Portable AP supine view of the abdomen at 0646 hours. Enteric tube courses into the airway and into the left mainstem bronchus terminating at the lower aspect of the left hilum on both views. Right PICC line in place. Left abdominal drainage catheter remains in place. Streaky opacity at the left base seen to be a mix of  pleural fluid and airspace disease appears stable since yesterday. The right lung is clear allowing for portable technique. Stable bowel gas pattern, nonobstructed. No acute osseous abnormality identified. IMPRESSION: 1. Malpositioned enteric tube is in the airway, left lower lobe bronchus. 2. Patchy left lung base opacity is stable since the CT yesterday due to combined pleural fluid and airspace disease. 3. Eight abdominal drainage catheter and stable bowel gas pattern. Electronically Signed: By: Genevie Ann M.D. On: 07/20/2018 07:23  Scheduled Meds: . chlorhexidine  15 mL Mouth Rinse BID  . heparin injection (subcutaneous)  5,000 Units Subcutaneous Q8H  . insulin aspart  0-20 Units Subcutaneous Q4H  . insulin glargine  25 Units Subcutaneous BID  . mouth rinse  15 mL Mouth Rinse q12n4p  . sodium chloride flush  5 mL Intracatheter Q8H   Continuous Infusions: . sodium chloride 75 mL/hr at 07/20/18 1808  . dextrose Stopped (07/19/18 1146)  . piperacillin-tazobactam (ZOSYN)  IV 3.375 g (07/21/18 0913)  . TPN ADULT (ION) 100 mL/hr at 07/20/18 1818  . TPN ADULT (ION)       LOS: 11 days    Time spent: [place], MD [facility]  If 7PM-7AM, please contact night-coverage [email or web address] Password [email or web address] 07/21/2018, 12:16 PM

## 2018-07-21 NOTE — Progress Notes (Addendum)
PHARMACY - ADULT TOTAL PARENTERAL NUTRITION CONSULT NOTE   Pharmacy Consult for TPN Indication: prolonged ileus  Patient Measurements: Height: 6' (182.9 cm) Weight: 248 lb 7.3 oz (112.7 kg) IBW/kg (Calculated) : 77.6 TPN AdjBW (KG): 86.1 Body mass index is 33.7 kg/m.  Assessment:  56 yom transferred from OSH with abdominal pain, N/V and found to have an acute perforated/obstructing large carcinoma in the colon with multiple metastases. S/p OR 10/9 for exlap and liver biopsy (positive) with colectomy. Attempted TF but with almost immediate vomiting. Started TPN for nutrition support. Pt is at risk for refeeding.   GI: s/p colectomy with going ileus -  171mL drain OP, NG OP no output noted. Last stool/BM noted 10/18. - 10/18: Perisplenic abscess drain, Hepatic abscess drain. GI Prophx: Famotidine in TPN  Endo: Hx DM (Amaryl, metformin PTA) - CBGs not controlled 256-295. Insulin requirements last 24 hrs: 66 units SSI + 40 units/L in bag currently  Lytes: K 3.8>4.3>4.5, Mg 2.6, Phos WNL, Corr Ca 10.48, Na 153 (D5 at 82ml/hr), Cl 119, bicarb 19 low Renal: ARI with  Scr trending up to 3.19, UOP 0.6 (I/O -2.8L)  Pulm: (extubated 10/13) RA; PRN albuterol. RR noted up to 41  Cards: h/o HTN:  VSS but tach up to 122.  AC: sq heparin d/c'd; HgB 9.7, PLT wnl  Hepatobil: LFTs trending up, albumin 2.4, tbili down to 1.4, prealbumin 13.2>>12.2, TG 274  Neuro: Transient confusion  ID: Zosyn postop for gross contamination with stool perforated colon - Afebrile, WBC 22.9 up  IVF: D5W changed to NS at 45ml/hr  TPN Access: CVC TPN start date: 07/16/18 Nutritional Goals (per RD rec on 10/14): KCal: 2400-2600 KCal Protein: 120-135 g  Fluid: >/= 2L/day  Goal TPN rate is 100 ml/hr (provides 132g protein and 2472 KCal - 100% goal)  Current Nutrition:  NPO TPN  Plan:  TPN at goal rate of 100 mL/hr - This TPN provides 132 g of protein, 360 g of dextrose, and 72 g of lipids which provides  2472 kCals per day, meeting 100% of patient needs  Electrolytes in TPN: Removed sodium, remove magnesium, remove calcium, maximize acetate.  Daily MVI, trace elements, Pepcid 20 mg to TPN  rSSI and adjust as needed Add 30 units of Lantus daily starting this AM Increase insulin to 50 units in TPN  Monitor TPN labs, CBG, insulin requirements, and triglycerides    Ayla Dunigan S. Alford Highland, PharmD, [place] Clinical Staff Pharmacist [phone] 07/21/2018 7:09 AM

## 2018-07-21 NOTE — Progress Notes (Addendum)
10 Days Post-Op    CC: perforated colon cancer  Subjective: Alert a little confused, doesn't really complain.  Very confused yesterday.  Objective: Vital signs in last 24 hours: Temp:  [98.9 F (37.2 C)-101 F (38.3 C)] 98.9 F (37.2 C) (10/19 0507) Pulse Rate:  [109-122] 118 (10/18 1951) Resp:  [22-41] 41 (10/18 1951) BP: (117-134)/(73-85) 117/77 (10/18 1951) SpO2:  [96 %-97 %] 97 % (10/18 1951) Last BM Date: 07/20/18 90 PO 1700 IV 1550 urine Stool 450 Drains 60 LLQ 30 cc right drain 20 cc left upper drain Tm 101 1900 last PM, down to 98.9 @3  AM. Continues to be tachycardic Na 153, Cl 119, creatinine 4.19 WBC still 22.9 WBC 4.0 - 10.5 K/uL 22.9(H) 23.3(H) 17.8(H)  INR 1.5  CT head this AM:  No acute change   Intake/Output from previous day: 10/18 0701 - 10/19 0700 In: 1773.1 [P.O.:90; I.V.:1546.4; IV Piggyback:101.7] Out: 2110 [Urine:1550; Drains:110; Stool:450] Intake/Output this shift: No intake/output data recorded.  General appearance: alert, cooperative and no distress Resp: clear to auscultation bilaterally and anterior GI: Open wound with what i think is a partial dehiscence.  Picture below.  2 new IR drains left OR drain JP is empty so I can't describe the drainage.  Left IR drain serous with some purulent fluid in it the serous fluid.  Right IR drain is currently serous.. Few BS,with drainage into the colostomy bag.     Lab Results:  Recent Labs    07/21/18 0035 07/21/18 0352  WBC 23.3* 22.9*  HGB 9.8* 9.7*  HCT 32.2* 31.1*  PLT 579* 597*    BMET Recent Labs    07/21/18 0035 07/21/18 0352  NA 151* 153*  K 4.5 4.5  CL 121* 119*  CO2 20* 19*  GLUCOSE 305* 326*  BUN 95* 98*  CREATININE 4.33* 4.19*  CALCIUM 9.0 9.2   PT/INR Recent Labs    07/21/18 0352  LABPROT 18.0*  INR 1.50    Recent Labs  Lab 07/17/18 0346 07/19/18 0009 07/21/18 0035  AST 73* 137* 148*  ALT 40 94* 137*  ALKPHOS 84 119 128*  BILITOT 3.0* 1.9* 1.4*    PROT 7.6 7.8 8.1  ALBUMIN 2.8* 2.5* 2.4*     Lipase  No results found for: LIPASE   Medications: . chlorhexidine  15 mL Mouth Rinse BID  . heparin injection (subcutaneous)  5,000 Units Subcutaneous Q8H  . insulin aspart  0-20 Units Subcutaneous Q4H  . insulin glargine  30 Units Subcutaneous Daily  . mouth rinse  15 mL Mouth Rinse q12n4p  . sodium chloride flush  5 mL Intracatheter Q8H   Scheduled Meds: . chlorhexidine  15 mL Mouth Rinse BID  . heparin injection (subcutaneous)  5,000 Units Subcutaneous Q8H  . insulin aspart  0-20 Units Subcutaneous Q4H  . insulin glargine  30 Units Subcutaneous Daily  . mouth rinse  15 mL Mouth Rinse q12n4p  . sodium chloride flush  5 mL Intracatheter Q8H   Continuous Infusions: . sodium chloride 75 mL/hr at 07/20/18 1808  . dextrose Stopped (07/19/18 1146)  . piperacillin-tazobactam (ZOSYN)  IV 3.375 g (07/21/18 0220)  . TPN ADULT (ION) 100 mL/hr at 07/20/18 1818  . TPN ADULT (ION)     PRN Meds:.acetaminophen, albuterol, morphine injection, sodium chloride flush   Anti-infectives (From admission, onward)   Start     Dose/Rate Route Frequency Ordered Stop   07/05/2018 1800  piperacillin-tazobactam (ZOSYN) IVPB 3.375 g     3.375 g  12.5 mL/hr over 240 Minutes Intravenous Every 8 hours 07/10/2018 1444     07/31/2018 0915  cefoTEtan (CEFOTAN) 2 g in sodium chloride 0.9 % 100 mL IVPB     2 g 200 mL/hr over 30 Minutes Intravenous To Short Stay 07/26/2018 0857 08/01/2018 1900   07/28/2018 0906  cefoTEtan in Dextrose 5% (CEFOTAN) 2-2.08 GM-%(50ML) IVPB    Note to Pharmacy:  [name]   : cabinet override      07/24/2018 0906 07/08/2018 2114   07/25/2018 1200  piperacillin-tazobactam (ZOSYN) IVPB 3.375 g  Status:  Discontinued     3.375 g 12.5 mL/hr over 240 Minutes Intravenous Every 8 hours 07/28/2018 0615 07/04/2018 1341   07/04/2018 0730  cefoTEtan (CEFOTAN) 2 g in sodium chloride 0.9 % 100 mL IVPB  Status:  Discontinued     2 g 200 mL/hr over 30 Minutes  Intravenous On call to O.R. 07/07/2018 0723 07/17/2018 0559   07/24/2018 0615  piperacillin-tazobactam (ZOSYN) IVPB 3.375 g     3.375 g 100 mL/hr over 30 Minutes Intravenous  Once 08/02/2018 0612 07/19/2018 1943     Assessment/Plan  Acute respiratory failure - on Vent- extubated 10/13 Acute kidney dz Creatinine 1.14 >>3.42>>3.23>>3.26>> 3.41 >> 4.19 Hypertension Diabetes GERD Remote tobacco use BMI 36 Anemia - transfused Hypokalemia- resolved  Hypernatremia Hyperchloremia  Malnutrition - moderate - prealbumin 12.2 Hypernatremia-152 Post op ileus Confusion with increasing WBC and electrolyte abnormalities  Perforated splenic flexure colon cancer with large and small bowel obstruction Metastatic Colonadenocarcinoma - liver Bx also positive S/PExploratory laparotomy, left colectomy and liver biopsy, 07/11/18, Dr. Rolm Bookbinder POD# 10 Post-op ileus  Continue NGT to LIWS - 500  - 24h  Continue JP Ambulate/IS  IR abdominal drain placement x 2 - 07/20/18 - 10 cc from each FEN: ice chips - sips of clears/ IV fluids/TNA ; correction of hypernatremia and hyperglycemia per primary team. ID: Zosyn 10/8 =>>day10; Cefotetan pre op  DVT: SCD/subcutaneous heparin Medicine: Dr. Evalee Mutton Ghimire/CCM: Dr. Darlys Gales Foley:none Follow up: Dr. Donne Hazel   Plan:  I will increase wet to dry dressing, binder, I think we can advance his diet to clears,but I will wait till Dr. Marlou Starks or Barry Dienes see him. Continue antibiotics, Lytes and glucose are being addressed by Medicine and Pharmacy.         LOS: 11 days    Shandria Clinch 07/21/2018 [id number]

## 2018-07-22 LAB — BASIC METABOLIC PANEL WITH GFR
BUN: 99 mg/dL — ABNORMAL HIGH (ref 8–23)
Chloride: 126 mmol/L — ABNORMAL HIGH (ref 98–111)
Creatinine, Ser: 4.12 mg/dL — ABNORMAL HIGH (ref 0.61–1.24)
GFR calc Af Amer: 16 mL/min — ABNORMAL LOW (ref >60)
Sodium: 156 mmol/L — ABNORMAL HIGH (ref 135–145)

## 2018-07-22 LAB — BASIC METABOLIC PANEL
Anion gap: 11 (ref 5–15)
CO2: 19 mmol/L — ABNORMAL LOW (ref 22–32)
Calcium: 9.1 mg/dL (ref 8.9–10.3)
GFR calc non Af Amer: 14 mL/min — ABNORMAL LOW (ref >60)
Glucose, Bld: 280 mg/dL — ABNORMAL HIGH (ref 70–99)
Potassium: 5 mmol/L (ref 3.5–5.1)

## 2018-07-22 LAB — GLUCOSE, CAPILLARY
GLUCOSE-CAPILLARY: 259 mg/dL — AB (ref 70–99)
GLUCOSE-CAPILLARY: 309 mg/dL — AB (ref 70–99)
GLUCOSE-CAPILLARY: 211 mg/dL — AB (ref 70–99)
Glucose-Capillary: 233 mg/dL — ABNORMAL HIGH (ref 70–99)
Glucose-Capillary: 317 mg/dL — ABNORMAL HIGH (ref 70–99)

## 2018-07-22 LAB — CK: Total CK: 61 U/L (ref 49–397)

## 2018-07-22 LAB — MAGNESIUM: Magnesium: 2.3 mg/dL (ref 1.7–2.4)

## 2018-07-22 MED ORDER — INSULIN GLARGINE 100 UNIT/ML ~~LOC~~ SOLN
10.0000 [IU] | SUBCUTANEOUS | Status: AC
  Administered 2018-07-22: 10 [IU] via SUBCUTANEOUS
  Filled 2018-07-22 (×2): qty 0.1

## 2018-07-22 MED ORDER — SODIUM CHLORIDE 0.9 % IV SOLN
850.0000 mg | INTRAVENOUS | Status: DC
  Administered 2018-07-22 – 2018-07-26 (×3): 850 mg via INTRAVENOUS
  Filled 2018-07-22 (×3): qty 17

## 2018-07-22 MED ORDER — TRAVASOL 10 % IV SOLN
INTRAVENOUS | Status: AC
  Administered 2018-07-22: 17:00:00 via INTRAVENOUS
  Filled 2018-07-22: qty 1320

## 2018-07-22 MED ORDER — SODIUM CHLORIDE 0.9 % IV SOLN
200.0000 mg | Freq: Once | INTRAVENOUS | Status: AC
  Administered 2018-07-22: 200 mg via INTRAVENOUS
  Filled 2018-07-22: qty 200

## 2018-07-22 MED ORDER — INSULIN GLARGINE 100 UNIT/ML ~~LOC~~ SOLN
35.0000 [IU] | Freq: Two times a day (BID) | SUBCUTANEOUS | Status: DC
  Administered 2018-07-22 – 2018-07-24 (×4): 35 [IU] via SUBCUTANEOUS
  Filled 2018-07-22 (×4): qty 0.35

## 2018-07-22 MED ORDER — DEXTROSE 5 % IV SOLN
INTRAVENOUS | Status: DC
  Administered 2018-07-22 – 2018-07-23 (×3): via INTRAVENOUS

## 2018-07-22 MED ORDER — SODIUM CHLORIDE 0.9 % IV SOLN
100.0000 mg | INTRAVENOUS | Status: DC
  Administered 2018-07-23 – 2018-08-03 (×12): 100 mg via INTRAVENOUS
  Filled 2018-07-22 (×13): qty 100

## 2018-07-22 NOTE — Progress Notes (Signed)
Pharmacy Antibiotic Note  Jerry Green is a 62 y.o. male admitted on 07/28/2018 with RLQ pain and was found to have perforated colon CA of splenic fracture, now s/p surgery on 79/8 complicated by fluid collections post-op. Noted drains placed 10/18. The patient continues with persistent fevers and elevated WBC. Pharmacy has been consulted for Daptomycin dosing.  The patient is in AKI with SCr 4.12 (BL 1.06 on 10/8), CrCl<30 ml/min. Will start Dapto dosing at 8 mg/kg given severe illness and to ensure optimal coverage for Enterococcus.  Plan: - Start Daptomycin 850 mg (~8 mg/kg) every 48 hours - Baseline CK, then weekly CK - Will continue to follow renal function, culture results, LOT, and antibiotic de-escalation plans   Height: 6' (182.9 cm) Weight: 245 lb 2.4 oz (111.2 kg) IBW/kg (Calculated) : 77.6  Temp (24hrs), Avg:99.5 F (37.5 C), Min:98.2 F (36.8 C), Max:102 F (38.9 C)  Recent Labs  Lab 07/16/18 0410 07/17/18 0346  07/19/18 0009 07/20/18 0342 07/21/18 0035 07/21/18 0352 07/22/18 0409  WBC 14.9* 16.2*  --  17.8*  --  23.3* 22.9*  --   CREATININE 3.10* 3.23*   < > 3.41* 3.66* 4.33* 4.19* 4.12*   < > = values in this interval not displayed.    Estimated Creatinine Clearance: 23.6 mL/min (A) (by C-G formula based on SCr of 4.12 mg/dL (H)).    Allergies  Allergen Reactions  . Aspirin Nausea Only    Antimicrobials this admission: Zosyn 10/9 > Dapto 10/20 >> Eraxis 10/20 >>  Dose adjustments this admission: n/a  Microbiology results: 10/8 MRSA  PCR : neg 10/18: Liver cx >> pending 10/19 BCx >> ngtd 10/20 BCx >>  Thank you for allowing pharmacy to be a part of this patient's care.  Alycia Rossetti, PharmD, BCPS Clinical Pharmacist Pager: [phone] Clinical phone for 07/22/2018 from 7a-3:30p: [phone] If after 3:30p, please call main pharmacy at: [phone] Please check AMION for all [place] numbers 07/22/2018 3:00 PM

## 2018-07-22 NOTE — Progress Notes (Signed)
Abdominal binder applied to patient's abdomen beneath the colostomy bag. Pt does not complain of any pain or discomfort after application. Will continue to monitor and treat per MD orders.

## 2018-07-22 NOTE — Progress Notes (Signed)
Surgeon is stating that the abdominal binder was not closed, this is false information. It was assessed by two nurses this morning including myself along with [facility]. It was not removed by the [facility] staff.

## 2018-07-22 NOTE — Progress Notes (Addendum)
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  His course was complicated by acute respiratory failure postoperatively as well as postop ileus and encephalopathy.   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  He continue the IV Zosyn.  The source of his fears are not clear however he is now 102.  He did have 2 drains on 07/20/2018 and appears to be more encephalopathic and worse today. -Continue Zosyn - Start IV vancomycin on 07/22/2018 -Start IV caspofungin on 07/22/2018  #) Perforated colon cancer of splenic fracture status post hemicolectomy and ostomy on 06/05/2670 complicated by fluid collections status post 2 drains on 07/20/2018:  - Interventional radiology placed 2 drains on 07/20/2018 at perisplenic and hepatic abscess -General surgery following, appreciate recommendations -N.p.o. pending return of bowel function, will be managed by general surgery - NG tube being managed by general surgery -Continue TPN per pharmacy, TPN  labs -Continue Zosyn started 07/09/2018, if patient continues to have persistent fevers or concern for infection would consider broadening to possible gram-positive coverage but almost certainly would consider additionally treating for occult fungal infection  #) Hypernatremia: Likely secondary to insensible free water losses.  Discussed with pharmacy and they will attempt to completely discontinue the sodium in the TPN and try to increase the volume for free water. -TPN management -Start D5W at 75 mils an hour  #) Encephalopathy: Combination of critical illness, hospitalization, metabolic derangements including hypernatremia -Noncontrast head CT on 07/20/2018 unremarkable -MRI brain ordered  #) AKI: At this time baseline creatinine was around 1.  Patient's creatinine is now around 3.5-4.  Suspect likely some degree of ATN and likely now developing CKD.  He continues to make urine however if this becomes an issue we will discuss with neurology.  Do not suspect the patient's encephalopathy is secondary to uremia.  #) Adenocarcinoma of the colon: -Oncology following, will follow up likely as an outpatient  #) Hypertension/hyperlipidemia: -Hold simvastatin 40 mg daily -Hold enalapril 5 mg daily  #) Gout: -Hold allopurinol 100 mg daily  #) Type 2 diabetes: -Hold glimepiride 2 mg twice daily -Hold metformin thousand milligrams twice daily  Fluids: TPN Elect lites: TPN Nutrition: TPN  Prophylaxis: SCDs  Disposition: Pending return of bowel function and return to baseline mental status  Full code    Consultants:   Oncology  General surgery  PCCM  Procedures:  07/07/2018 ex lap, left colectomy, biopsy of liver lesion  07/16/2018 P ICC placed, TNA initiated on 07/16/2018  Antimicrobials:   IV Zosyn started 07/09/2018 to ongoing   Subjective: Patient is noted to be more  encephalopathic this morning.  He is less interactive and quite lethargic.  He has been somewhat  agitated and is unfortunately had some of his surgical incision site dehisced for which surgery is following.  He did have a fever last night.  Objective: Vitals:   07/22/18 0543 07/22/18 0848 07/22/18 1207 07/22/18 1302  BP: 125/76 120/83 125/76   Pulse: 96 (!) 103 (!) 102   Resp: (!) 28 (!) 28 (!) 42   Temp: 98.7 F (37.1 C) 98.9 F (37.2 C) 98.9 F (37.2 C) 100 F (37.8 C)  TempSrc: Oral Oral Oral Oral  SpO2: 98% 98% 100%   Weight:      Height:        Intake/Output Summary (Last 24 hours) at 07/22/2018 1411 Last data filed at 07/22/2018 1305 Gross per 24 hour  Intake 2008.22 ml  Output 4268 ml  Net -2259.78 ml   Filed Weights   07/19/18 0500 07/20/18 0500 07/22/18 0500  Weight: 113.5 kg 112.7 kg 111.2 kg    Examination:  General exam: Mildly agitated Respiratory system: Clear to auscultation. Respiratory effort normal. Cardiovascular system: Regular rate and rhythm, no murmurs Gastrointestinal system: Diminished bowel sounds, no rebound or guarding, mild tenderness to deep palpation. Central nervous system: Not alert or oriented, spontaneously moving all extremities but not to command Extremities: Trace lower extremity edema Skin: Well-healed midline incision with wound VAC in place, ostomy site is clean dry and intact, percutaneous drains x2 noted over abdomen Psychiatry: Unable to assess due to medical condition    Data Reviewed: I have personally reviewed following labs and imaging studies  CBC: Recent Labs  Lab 07/16/18 0410 07/17/18 0346 07/19/18 0009 07/21/18 0035 07/21/18 0352  WBC 14.9* 16.2* 17.8* 23.3* 22.9*  NEUTROABS 11.3* 11.7*  --   --   --   HGB 8.3* 8.8* 9.6* 9.8* 9.7*  HCT 26.3* 27.6* 29.8* 32.2* 31.1*  MCV 88.9 88.7 88.7 92.0 92.0  PLT 258 318 400 579* 122*   Basic Metabolic Panel: Recent Labs  Lab 07/17/18 0346  07/18/18 0254 07/18/18 0427 07/19/18 0009 07/19/18 0319 07/20/18 0342 07/21/18 0035 07/21/18 0352 07/22/18 0409    NA 150*  --  146* 153* 152*  --  152* 151* 153* 156*  K 2.9*   < > 6.4* 3.4* 3.8  --  4.3 4.5 4.5 5.0  CL 117*  --  113* 118* 115*  --  117* 121* 119* 126*  CO2 22  --  21* 22 22  --  20* 20* 19* 19*  GLUCOSE 174*  --  669* 189* 243*  --  273* 305* 326* 280*  BUN 61*  --  65* 67* 74*  --  85* 95* 98* 99*  CREATININE 3.23*  --  3.06* 3.26* 3.41*  --  3.66* 4.33* 4.19* 4.12*  CALCIUM 9.2  --  9.5 9.6 9.3  --  9.5 9.0 9.2 9.1  MG 2.6*  --  3.0* 2.8*  --  2.6* 2.6*  --   --  2.3  PHOS 3.6  --  5.5* 3.9  --  4.6 4.2  --   --   --    < > = values in this interval not displayed.   GFR: Estimated Creatinine Clearance: 23.6 mL/min (A) (by C-G formula based on SCr of 4.12 mg/dL (H)). Liver Function Tests: Recent Labs  Lab 07/17/18 0346 07/19/18 0009 07/21/18 0035  AST 73* 137* 148*  ALT 40 94* 137*  ALKPHOS 84 119 128*  BILITOT 3.0* 1.9* 1.4*  PROT 7.6 7.8 8.1  ALBUMIN 2.8* 2.5* 2.4*   No results for input(s): LIPASE, AMYLASE in the last 168 hours. No results for input(s): AMMONIA in the last 168 hours. Coagulation Profile: Recent Labs  Lab 07/21/18 0352  INR 1.50   Cardiac Enzymes: No results for input(s): CKTOTAL, CKMB, CKMBINDEX, TROPONINI in the last 168 hours. BNP (last 3 results) No results for input(s): PROBNP in the last 8760 hours. HbA1C: No results for input(s): HGBA1C in the last 72 hours. CBG: Recent Labs  Lab 07/21/18 1944 07/21/18 2335 07/22/18 0432 07/22/18 0843 07/22/18 1205  GLUCAP 278* 288* 233* 259* 317*   Lipid Profile: No results for input(s): CHOL, HDL, LDLCALC, TRIG, CHOLHDL, LDLDIRECT in the last 72 hours. Thyroid Function Tests: No results for input(s): TSH, T4TOTAL, FREET4, T3FREE, THYROIDAB in the last 72 hours. Anemia Panel: No results for input(s): VITAMINB12, FOLATE, FERRITIN, TIBC, IRON, RETICCTPCT in the last 72 hours. Sepsis Labs: No results for input(s): PROCALCITON, LATICACIDVEN in the last 168 hours.  Recent Results (from the  past 240 hour(s))  Aerobic/Anaerobic Culture (surgical/deep wound)     Status: None (Preliminary result)   Collection Time: 07/20/18  5:43 PM  Result Value Ref Range Status   Specimen Description LIVER  Final   Special Requests NONE  Final   Gram Stain   Final    RARE WBC PRESENT, PREDOMINANTLY PMN NO ORGANISMS SEEN    Culture   Final    NO GROWTH 2 DAYS NO ANAEROBES ISOLATED; CULTURE IN PROGRESS FOR 5 DAYS Performed at [facility], [address], [place], [place] [address]    Report Status PENDING  Incomplete  Culture, blood (routine x 2)     Status: None (Preliminary result)   Collection Time: 07/21/18  8:43 AM  Result Value Ref Range Status   Specimen Description BLOOD LEFT ANTECUBITAL  Final   Special Requests   Final    BOTTLES DRAWN AEROBIC AND ANAEROBIC Blood Culture results may not be optimal due to an excessive volume of blood received in culture bottles   Culture   Final    NO GROWTH 1 DAY Performed at [facility], [place] [address], [place], [place] [address]    Report Status PENDING  Incomplete  Culture, blood (routine x 2)     Status: None (Preliminary result)   Collection Time: 07/21/18  8:49 AM  Result Value Ref Range Status   Specimen Description BLOOD LEFT HAND  Final   Special Requests   Final    BOTTLES DRAWN AEROBIC ONLY Blood Culture adequate volume   Culture   Final    NO GROWTH 1 DAY Performed at [facility], [place] [address], [place], [place] [address]    Report Status PENDING  Incomplete         Radiology Studies: Ct Head Wo Contrast  Result Date: 07/21/2018 CLINICAL DATA:  62 y/o M; altered encephalopathy. History of hypertension and diabetes. EXAM: CT HEAD WITHOUT CONTRAST TECHNIQUE: Contiguous axial images were obtained from the base of the skull through the vertex without intravenous contrast. COMPARISON:  None. FINDINGS: Brain: No evidence of acute infarction, hemorrhage, hydrocephalus, extra-axial collection or mass  lesion/mass effect. Nonspecific white matter hypodensities are compatible with mild chronic microvascular ischemic changes and there is mild volume loss of the brain for age. Vascular: Calcific atherosclerosis of carotid siphons. No hyperdense vessel identified. Skull: Normal. Negative for fracture or focal lesion. Sinuses/Orbits: Mild diffuse  paranasal sinus mucosal thickening. Normal aeration of the mastoid air cells. Orbits are unremarkable. Other: None. IMPRESSION: 1. No acute intracranial abnormality identified. 2. Mild chronic microvascular ischemic changes and mild volume loss of the brain for age. Electronically Signed   By: Kristine Garbe M.D.   On: 07/21/2018 00:37        Scheduled Meds: . chlorhexidine  15 mL Mouth Rinse BID  . heparin injection (subcutaneous)  5,000 Units Subcutaneous Q8H  . insulin aspart  0-20 Units Subcutaneous Q4H  . insulin glargine  35 Units Subcutaneous BID  . mouth rinse  15 mL Mouth Rinse q12n4p  . sodium chloride flush  5 mL Intracatheter Q8H   Continuous Infusions: . dextrose 75 mL/hr at 07/22/18 1008  . piperacillin-tazobactam (ZOSYN)  IV 3.375 g (07/22/18 0943)  . TPN ADULT (ION) 100 mL/hr at 07/21/18 1655  . TPN ADULT (ION)       LOS: 12 days    Time spent: [place], MD [facility]  If 7PM-7AM, please contact night-coverage [email or web address] Password [email or web address] 07/22/2018, 2:11 PM

## 2018-07-22 NOTE — Progress Notes (Signed)
RN received call from telemetry that pt had a burst of SVT with HR in the 140's. Pt was asymptomatic and resting in the bed. BP was 122/82 and HR returned to 101-105. Sofia,PA notified and no new orders placed. Pt resting comfortably in bed at this time. Will continue to monitor and treat per MD orders.

## 2018-07-22 NOTE — Progress Notes (Signed)
Wound care was done three times on my shift 7 am to 7:30pm yesterday and was done over night. Charge nurses on both shifts, the nurse last night, and myself agree if you cut the abdominal binder for the colostomy, the binder will not be able to be applied correctly or it may pull off the colostomy bag when you go to tighten the binder.

## 2018-07-22 NOTE — Progress Notes (Signed)
Patient was assessed by myself and Clinical biochemist. For his abdominal binder, colostomy, and drains. Patient was repositioned in bed. Abdominal binder is on secure.

## 2018-07-22 NOTE — Progress Notes (Signed)
11 Days Post-Op    CC: Perforated colon  Subjective: Nursing reports run of SVT last PM. I can't be sure but it looks like Aflutter. Medicine following for this.  In bed he is extremely confused laying cross way and naked.  Coralee Pesa is not on.  He has brown purulent drainage on the wound dressing.  With further breakdown of the midline wound picture below.    Objective: Vital signs in last 24 hours: Temp:  [98.2 F (36.8 C)-102 F (38.9 C)] 98.7 F (37.1 C) (10/20 0543) Pulse Rate:  [96-109] 96 (10/20 0543) Resp:  [21-46] 28 (10/20 0543) BP: (99-133)/(54-85) 125/76 (10/20 0543) SpO2:  [96 %-100 %] 98 % (10/20 0543) Weight:  [111.2 kg] 111.2 kg (10/20 0500) Last BM Date: 07/20/18 1806 IV 2800 urine 53 the drain Nothing reported through the ostomy Fever again last p.m. to 102 afebrile, ongoing tachycardia Sodium is 156, potassium is 5, glucose 280, creatinine 4.12.  Intake/Output from previous day: 10/19 0701 - 10/20 0700 In: 1806.4 [I.V.:1806.4] Out: 2850 [Urine:2800; Drains:50] Intake/Output this shift: No intake/output data recorded.  General appearance: he is awake but more confused than yesterday. Resp: clear to auscultation bilaterally and Anterior exam. Cardio: Currently sinus tachycardia. GI: There is drainage on the dressing that looks feculent when I first open it but I think is just breakdown of the midline.  He can see more necrotic fat in the midline that was there yesterday.  His ostomy is working and full of green stool.    Today   Film yesterday  Lab Results:  Recent Labs    07/21/18 0035 07/21/18 0352  WBC 23.3* 22.9*  HGB 9.8* 9.7*  HCT 32.2* 31.1*  PLT 579* 597*    BMET Recent Labs    07/21/18 0352 07/22/18 0409  NA 153* 156*  K 4.5 5.0  CL 119* 126*  CO2 19* 19*  GLUCOSE 326* 280*  BUN 98* 99*  CREATININE 4.19* 4.12*  CALCIUM 9.2 9.1   PT/INR Recent Labs    07/21/18 0352  LABPROT 18.0*  INR 1.50    Recent Labs  Lab  07/17/18 0346 07/19/18 0009 07/21/18 0035  AST 73* 137* 148*  ALT 40 94* 137*  ALKPHOS 84 119 128*  BILITOT 3.0* 1.9* 1.4*  PROT 7.6 7.8 8.1  ALBUMIN 2.8* 2.5* 2.4*     Lipase  No results found for: LIPASE   Medications: . chlorhexidine  15 mL Mouth Rinse BID  . heparin injection (subcutaneous)  5,000 Units Subcutaneous Q8H  . insulin aspart  0-20 Units Subcutaneous Q4H  . insulin glargine  25 Units Subcutaneous BID  . mouth rinse  15 mL Mouth Rinse q12n4p  . sodium chloride flush  5 mL Intracatheter Q8H    Assessment/Plan  Acute respiratory failure - on Vent- extubated 10/13 Acute kidney dz Creatinine 1.14 >>3.42>>3.23>>3.26>>3.41 >> 4.19 >4.12 Hypertension Diabetes GERD Remote tobacco use BMI 36 Anemia - transfused Hypokalemia- resolved  Hypernatremia Hyperchloremia Malnutrition - moderate - prealbumin 12.2 Hypernatremia-152 Post op ileus Confusion with increasing WBC and electrolyte abnormalities SVT reported 07/21/18  Perforated splenic flexure colon cancer with large and small bowel obstruction Metastatic Colonadenocarcinoma - liver Bx also positive S/PExploratory laparotomy, left colectomy and liver biopsy, 07/08/2018, Dr. Rolm Bookbinder POD#[id number]  Abdominal wound dehiscence Post-op ileus  Continue NGT to LIWS -500 - 24h  Continue JP Ambulate/IS  IR abdominal drain placement x 2 - 07/20/18 - 10 cc from each FEN: ice chips - sips of clears/ IV fluids/TNA ;  correction of hypernatremia and hyperglycemia per primary team. ID: Zosyn 10/8 =>>day10; Cefotetan pre op  DVT: SCD/subcutaneous heparin Medicine: Dr. Evalee Mutton Ghimire/CCM: Dr. Darlys Gales Foley:none Follow up: Dr. Donne Hazel   Plan:  I have talked to the charge nurse and Medicine Dr. Herbert Moors, about changes, care and progressive decline.  Will also talk with Dr. Brantley Stage.  I am going to make him NPO for now until we decide if he  needs to go back to the OR.      LOS: 12 days    Jerry Green 07/22/2018 [phone]

## 2018-07-22 NOTE — Progress Notes (Signed)
Jerry Green CONSULT NOTE   Pharmacy Consult for TPN Indication: prolonged ileus  Patient Measurements: Height: 6' (182.9 cm) Weight: 245 lb 2.4 oz (111.2 kg) IBW/kg (Calculated) : 77.6 TPN AdjBW (KG): 86.1 Body mass index is 33.25 kg/m.  Assessment:  5 yom transferred from OSH with abdominal pain, N/V and found to have an acute perforated/obstructing large carcinoma in the colon with multiple metastases. S/p OR 10/9 for exlap and liver biopsy (positive) with colectomy. Attempted TF but with almost immediate vomiting. Started TPN for nutrition support. Pt is at risk for refeeding.   GI: s/p colectomy with going ileus -  65mL drain OP, NG OP no output noted. Last stool/BM with colostomy output noted this AM 10/20. - 10/18: Perisplenic abscess drain, Hepatic abscess drain. GI Prophx: Famotidine in TPN  Endo: Hx DM (Amaryl, metformin PTA) - CBGs not controlled 233-288.  Insulin requirements last 24 hrs: 65 units SSI + 50 units/L in bag currently + Lantus 25 units BID. Will increase today. - Resuming D5W at 40ml/hr due to elevated Na  Lytes: K 5 up, Mg 2.3, Phos WNL, Corr Ca 10.38, Na 156 up, Cl 126 up, bicarb 19 low. Worsening renal function - MD called to discuss increasing free water in TPN. Cannot fix electrolyte abnormalities with TPN. Will just try not to exacerbate them. Best done outside the TPN  Renal: ARI with  Scr trending up to 4.12, UOP up to 1.5. I/O =5.2L total.  Pulm: (extubated 10/13) RA; PRN albuterol. RR noted up to 46, currently 28  Cards: h/o HTN:  VSS but tach up to 109  AC: sq heparin; HgB 9.7, PLT wnl  Hepatobil: LFTs trending up, albumin 2.4, tbili down to 1.4, prealbumin 13.2>>12.2, TG 274  Neuro: Transient confusion  ID: Zosyn postop for gross contamination with stool perforated colon - Afebrile, WBC 22.9 up  IVF: D5W at 75ml/hr  TPN Access: CVC TPN start date: 07/16/18 Nutritional Goals (per RD rec on 10/14): KCal:  2400-2600 KCal Protein: 120-135 g  Fluid: >/= 2L/day  Goal TPN rate is 100 ml/hr (provides 132g protein and 2472 KCal - 100% goal)  Current Nutrition:  NPO TPN  Plan:  TPN at goal rate of 100 mL/hr - This TPN provides 132 g of protein, 360 g of dextrose, and 72 g of lipids which provides 2472 kCals per day, meeting 100% of patient needs  - Cannot adjust free water in TPN more without overfeeding or adding additional glucose.  Electrolytes in TPN: Removed sodium, remove K+ temporarily, remove magnesium, remove calcium, maximize acetate. Removed Phos due to cation balance.  Daily MVI, trace elements, Pepcid 20 mg to TPN  rSSI and adjust as needed  Increase Lantus to 35 units BID Increase insulin to MAX 65 units/L in TPN (insulin 120 units>>156 units)  - MD Resuming D5W at 59ml/hr due to elevated Na  Monitor TPN labs, CBG, insulin requirements, and triglycerides    Jerry Green, PharmD, [place] Clinical Staff Pharmacist [phone] 07/22/2018 9:53 AM

## 2018-07-22 NOTE — Progress Notes (Signed)
Wound care was provided to midline incision. Cleansed with sterile saline then covered with moistened kerlex, abd pad and taped in place.  Split gauze to bilateral JP drain and hepatic abscess drain sites after incision sites were cleansed with sterile saline. Abdominal binder replaced with colostomy bag outside the binder. Binder was not cut as requested by PA as the colostomy bag will fit appropriately over the top of the binder. This binder is very thick and woud most likely start to shred if cut. Extra colostomy supplies at the bedside. Wound ostomy nurse consult would most likely be appropriate for this patient.   Rosalio Loud MSN RN

## 2018-07-22 NOTE — Progress Notes (Signed)
Wound care was done. 

## 2018-07-23 LAB — DIFFERENTIAL
Abs Immature Granulocytes: 0.2 10*3/uL — ABNORMAL HIGH (ref 0.00–0.07)
BASOS ABS: 0.1 10*3/uL (ref 0.0–0.1)
Basophils Relative: 1 %
EOS PCT: 3 %
Eosinophils Absolute: 0.7 10*3/uL — ABNORMAL HIGH (ref 0.0–0.5)
IMMATURE GRANULOCYTES: 1 %
LYMPHS ABS: 2.5 10*3/uL (ref 0.7–4.0)
LYMPHS PCT: 12 %
MONO ABS: 1.2 10*3/uL — AB (ref 0.1–1.0)
Monocytes Relative: 6 %
Neutro Abs: 15.6 10*3/uL — ABNORMAL HIGH (ref 1.7–7.7)
Neutrophils Relative %: 77 %

## 2018-07-23 LAB — COMPREHENSIVE METABOLIC PANEL WITH GFR
ALT: 114 U/L — ABNORMAL HIGH (ref 0–44)
AST: 94 U/L — ABNORMAL HIGH (ref 15–41)
Albumin: 2.1 g/dL — ABNORMAL LOW (ref 3.5–5.0)
BUN: 88 mg/dL — ABNORMAL HIGH (ref 8–23)
CO2: 18 mmol/L — ABNORMAL LOW (ref 22–32)
Chloride: 129 mmol/L — ABNORMAL HIGH (ref 98–111)
Creatinine, Ser: 3.88 mg/dL — ABNORMAL HIGH (ref 0.61–1.24)
Glucose, Bld: 267 mg/dL — ABNORMAL HIGH (ref 70–99)
Potassium: 4.5 mmol/L (ref 3.5–5.1)

## 2018-07-23 LAB — GLUCOSE, CAPILLARY
GLUCOSE-CAPILLARY: 223 mg/dL — AB (ref 70–99)
GLUCOSE-CAPILLARY: 231 mg/dL — AB (ref 70–99)
GLUCOSE-CAPILLARY: 221 mg/dL — AB (ref 70–99)
Glucose-Capillary: 207 mg/dL — ABNORMAL HIGH (ref 70–99)
Glucose-Capillary: 221 mg/dL — ABNORMAL HIGH (ref 70–99)
Glucose-Capillary: 249 mg/dL — ABNORMAL HIGH (ref 70–99)
Glucose-Capillary: 265 mg/dL — ABNORMAL HIGH (ref 70–99)

## 2018-07-23 LAB — CBC
HCT: 29.5 % — ABNORMAL LOW (ref 39.0–52.0)
Hemoglobin: 8.9 g/dL — ABNORMAL LOW (ref 13.0–17.0)
MCH: 28.2 pg (ref 26.0–34.0)
MCHC: 30.2 g/dL (ref 30.0–36.0)
MCV: 93.4 fL (ref 80.0–100.0)
Platelets: 599 10*3/uL — ABNORMAL HIGH (ref 150–400)
RBC: 3.16 MIL/uL — ABNORMAL LOW (ref 4.22–5.81)
RDW: 14.9 % (ref 11.5–15.5)
WBC: 20.3 10*3/uL — ABNORMAL HIGH (ref 4.0–10.5)
nRBC: 0 % (ref 0.0–0.2)

## 2018-07-23 LAB — TRIGLYCERIDES: TRIGLYCERIDES: 316 mg/dL — AB (ref <150)

## 2018-07-23 LAB — COMPREHENSIVE METABOLIC PANEL
Alkaline Phosphatase: 125 U/L (ref 38–126)
Anion gap: 6 (ref 5–15)
Calcium: 9.1 mg/dL (ref 8.9–10.3)
GFR calc Af Amer: 18 mL/min — ABNORMAL LOW (ref >60)
GFR calc non Af Amer: 15 mL/min — ABNORMAL LOW (ref >60)
Sodium: 153 mmol/L — ABNORMAL HIGH (ref 135–145)
Total Bilirubin: 1 mg/dL (ref 0.3–1.2)
Total Protein: 8.8 g/dL — ABNORMAL HIGH (ref 6.5–8.1)

## 2018-07-23 LAB — MAGNESIUM: MAGNESIUM: 2.1 mg/dL (ref 1.7–2.4)

## 2018-07-23 LAB — PREALBUMIN: PREALBUMIN: 26.8 mg/dL (ref 18–38)

## 2018-07-23 LAB — PHOSPHORUS: Phosphorus: 3.6 mg/dL (ref 2.5–4.6)

## 2018-07-23 MED ORDER — TRAVASOL 10 % IV SOLN
INTRAVENOUS | Status: AC
  Administered 2018-07-23: 18:00:00 via INTRAVENOUS
  Filled 2018-07-23: qty 1320

## 2018-07-23 NOTE — Progress Notes (Addendum)
12 Days Post-Op  Subjective: Stable and alert.  Denies any problems. No new nursing issues per RN. Not a good historian Good output through right upper quadrant colostomy. Normotensive.  T-max 100.3.  Heart rate 102. WBC 20,000.  Hemoglobin 8.9.  Sodium 153, being addressed with D5W creatinine 3.88, slightly less.  AST and ALT mildly elevated.  Objective: Vital signs in last 24 hours: Temp:  [98.3 F (36.8 C)-100.3 F (37.9 C)] 100.3 F (37.9 C) (10/21 0407) Pulse Rate:  [96-107] 102 (10/21 0544) Resp:  [28-46] 36 (10/21 0544) BP: (110-140)/(76-87) 110/78 (10/21 0544) SpO2:  [98 %-100 %] 100 % (10/21 0544) Weight:  [112.7 kg] 112.7 kg (10/21 0500) Last BM Date: 07/23/18  Intake/Output from previous day: 10/20 0701 - 10/21 0700 In: 3921.6 [I.V.:3350.3; IV Piggyback:521.4] Out: 3700 [Urine:3100; Drains:70; Stool:530] Intake/Output this shift: No intake/output data recorded.  General appearance: Alert.  Cooperative.  No distress or appearance of toxicity.  Somewhat confused about place and situation. Resp: clear to auscultation bilaterally GI: Open midline wound examined.  Some fascial dehiscence but everything seems stuck well I doubt that is going to be any evisceration.  No enteric drainage.  Continue dressing changes abdomen otherwise fairly soft.  Colostomy right upper quadrant pink with liquid output.  IR drain right upper quadrant.  Blake drain left lower quadrant.  IR drain left upper quadrant purulent.  Lab Results:  Results for orders placed or performed during the hospital encounter of 08/01/2018 (from the past 24 hour(s))  Glucose, capillary     Status: Abnormal   Collection Time: 07/22/18  8:43 AM  Result Value Ref Range   Glucose-Capillary 259 (H) 70 - 99 mg/dL  Glucose, capillary     Status: Abnormal   Collection Time: 07/22/18 12:05 PM  Result Value Ref Range   Glucose-Capillary 317 (H) 70 - 99 mg/dL  CK     Status: None   Collection Time: 07/22/18  4:44 PM   Result Value Ref Range   Total CK 61 49 - 397 U/L  Glucose, capillary     Status: Abnormal   Collection Time: 07/22/18  4:54 PM  Result Value Ref Range   Glucose-Capillary 309 (H) 70 - 99 mg/dL  Glucose, capillary     Status: Abnormal   Collection Time: 07/22/18  8:42 PM  Result Value Ref Range   Glucose-Capillary 211 (H) 70 - 99 mg/dL  Glucose, capillary     Status: Abnormal   Collection Time: 07/22/18 11:59 PM  Result Value Ref Range   Glucose-Capillary 207 (H) 70 - 99 mg/dL  Magnesium     Status: None   Collection Time: 07/23/18  3:46 AM  Result Value Ref Range   Magnesium 2.1 1.7 - 2.4 mg/dL  Phosphorus     Status: None   Collection Time: 07/23/18  3:46 AM  Result Value Ref Range   Phosphorus 3.6 2.5 - 4.6 mg/dL  Differential     Status: Abnormal   Collection Time: 07/23/18  3:46 AM  Result Value Ref Range   Neutrophils Relative % 77 %   Neutro Abs 15.6 (H) 1.7 - 7.7 K/uL   Lymphocytes Relative 12 %   Lymphs Abs 2.5 0.7 - 4.0 K/uL   Monocytes Relative 6 %   Monocytes Absolute 1.2 (H) 0.1 - 1.0 K/uL   Eosinophils Relative 3 %   Eosinophils Absolute 0.7 (H) 0.0 - 0.5 K/uL   Basophils Relative 1 %   Basophils Absolute 0.1 0.0 - 0.1 K/uL  Immature Granulocytes 1 %   Abs Immature Granulocytes 0.20 (H) 0.00 - 0.07 K/uL  Prealbumin     Status: None   Collection Time: 07/23/18  3:46 AM  Result Value Ref Range   Prealbumin 26.8 18 - 38 mg/dL  Triglycerides     Status: Abnormal   Collection Time: 07/23/18  3:46 AM  Result Value Ref Range   Triglycerides 316 (H) <150 mg/dL  Comprehensive metabolic panel     Status: Abnormal   Collection Time: 07/23/18  3:46 AM  Result Value Ref Range   Sodium 153 (H) 135 - 145 mmol/L   Potassium 4.5 3.5 - 5.1 mmol/L   Chloride 129 (H) 98 - 111 mmol/L   CO2 18 (L) 22 - 32 mmol/L   Glucose, Bld 267 (H) 70 - 99 mg/dL   BUN 88 (H) 8 - 23 mg/dL   Creatinine, Ser 3.88 (H) 0.61 - 1.24 mg/dL   Calcium 9.1 8.9 - 10.3 mg/dL   Total Protein  8.8 (H) 6.5 - 8.1 g/dL   Albumin 2.1 (L) 3.5 - 5.0 g/dL   AST 94 (H) 15 - 41 U/L   ALT 114 (H) 0 - 44 U/L   Alkaline Phosphatase 125 38 - 126 U/L   Total Bilirubin 1.0 0.3 - 1.2 mg/dL   GFR calc non Af Amer 15 (L) >60 mL/min   GFR calc Af Amer 18 (L) >60 mL/min   Anion gap 6 5 - 15  CBC     Status: Abnormal   Collection Time: 07/23/18  3:46 AM  Result Value Ref Range   WBC 20.3 (H) 4.0 - 10.5 K/uL   RBC 3.16 (L) 4.22 - 5.81 MIL/uL   Hemoglobin 8.9 (L) 13.0 - 17.0 g/dL   HCT 29.5 (L) 39.0 - 52.0 %   MCV 93.4 80.0 - 100.0 fL   MCH 28.2 26.0 - 34.0 pg   MCHC 30.2 30.0 - 36.0 g/dL   RDW 14.9 11.5 - 15.5 %   Platelets 599 (H) 150 - 400 K/uL   nRBC 0.0 0.0 - 0.2 %  Glucose, capillary     Status: Abnormal   Collection Time: 07/23/18  4:20 AM  Result Value Ref Range   Glucose-Capillary 223 (H) 70 - 99 mg/dL     Studies/Results: No results found.  . chlorhexidine  15 mL Mouth Rinse BID  . heparin injection (subcutaneous)  5,000 Units Subcutaneous Q8H  . insulin aspart  0-20 Units Subcutaneous Q4H  . insulin glargine  35 Units Subcutaneous BID  . mouth rinse  15 mL Mouth Rinse q12n4p  . sodium chloride flush  5 mL Intracatheter Q8H     Assessment/Plan: s/p Procedure(s): EXPLORATORY LAPAROTOMY PARTIAL COLON RESECTION WITH COLOSTOMY LIVER BIOPSY APPLICATION OF WOUND VAC  Acute respiratory failure - on Vent- extubated 10/13 Acute kidney dz Creatinine 1.14 >>3.42>>3.23>>3.26>>3.41>> 4.19 >4.12> 3.88 Hypertension Diabetes GERD Remote tobacco use BMI 36 Anemia - transfused Hypokalemia- resolved  Hypernatremia Hyperchloremia Malnutrition - moderate - prealbumin 12.2 Hypernatremia-153 Post op ileus Confusion with increasing WBC and electrolyte abnormalities SVT reported 07/21/18  Perforated splenic flexure colon cancer with large and small bowel obstruction 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 Rolm Bookbinder POD#12  Abdominal wound dehiscence--no evisceration.  Does not need any reoperative surgery. Post-op ileus  Continue NGT to LIWS -500 - 24h  Continue JP Ambulate/IS  TNA  IR abdominal drain placement x 2 - 07/20/18 - 10 cc from each      Interventional radiology following.  Considering persistent leukocytosis hopefully follow-up CT scan today or tomorrow.  FEN: ice chips - sips of clears/ IV fluids/TNA ; correction of hypernatremia and hyperglycemia per primary team. ID: Zosyn 10/8 =>>day10; Cefotetan pre op  DVT: SCD/subcutaneous heparin Medicine: Dr. Evalee Mutton Ghimire/CCM: Dr. Darlys Gales Foley:none Follow up: Dr. Donne Hazel   Plan: No indication for return to OR.             Repeat imaging by IR at their discretion to be sure that all fluid collections are drained.              Persistent leukocytosis of concern    @PROBHOSP @  LOS: 13 days    Adin Hector 07/23/2018  . .prob

## 2018-07-23 NOTE — Progress Notes (Signed)
Pt' s temp assessed and noted to be 102.1. PRN tylenol suppository administered. Temp reassessed and noted to be 100.2 and later reassessed to be 99.5. MD notified. Pt currently asymptomatic, showing no signs of distress. RN to continue to monitor.

## 2018-07-23 NOTE — Consult Note (Addendum)
[place] Nurse wound follow up Evaluation of midline abdominal incision completed in [facility] 5W25.  No family present.  Wound type: Surgical incision Wound bed: 100% pink, clean, granulation tissue.  Abdominal binder in place. Drainage (amount, consistency, odor) Minimal serous on existing dressing. Periwound: Intact, normal skin color and texture Dressing procedure/placement/frequency: Continue twice daily saline moistened gauze dressings. Additional plan of care item:  Add a low air loss mattress as the patient is not turning himself in the bed. Val Riles, RN, MSN, CWOCN, CNS-BC, pager [phone]

## 2018-07-23 NOTE — Progress Notes (Signed)
PHARMACY - ADULT TOTAL PARENTERAL NUTRITION CONSULT NOTE   Pharmacy Consult for TPN Indication: prolonged ileus  Patient Measurements: Height: 6' (182.9 cm) Weight: 248 lb 7.3 oz (112.7 kg) IBW/kg (Calculated) : 77.6 TPN AdjBW (KG): 86.1 Body mass index is 33.7 kg/m.  Assessment:  31 yom transferred from OSH with abdominal pain, N/V and found to have an acute perforated/obstructing large carcinoma in the colon with multiple metastases. S/p OR 10/9 for exlap and liver biopsy (positive) with colectomy. Attempted TF but with almost immediate vomiting. Started TPN for nutrition support with post-op ileus. Pt is at risk for refeeding.   GI: Pre-albumin up to WNL 26.8. S/p colectomy with going ileus -  7mL drain OP, 31mL NG OP. LBM 10/21 - 10/18: Perisplenic abscess drain, Hepatic abscess drain. Famotidine in TPN  Endo: Hx DM (Amaryl, metformin PTA) - CBGs uncontrolled 207-265.  Insulin requirements last 24 hrs: 62 units rSSI + increased to 65 units in TPN + Lantus increased to 35 units BID Lytes: K down to 4.5, Na down to 153 (D5 at 75 ml/hr), corr Ca~10.5, Cl up to 129, CO2 down to 18 - MD called to discuss increasing free water in TPN. Cannot fix electrolyte abnormalities with TPN. Will just try not to exacerbate them. Best done outside the TPN Renal: AKI (peak SCr 4.19) with Scr now trending down to 3.88 (baseline~1), BUN down to 88, UOP 1.1 cc/kg/hr. D5 at 75 ml/hr per MD Pulm: extubated 10/13 to RA; PRN albuterol Cards: h/o HTN:  BP ok, tachy in 100s AC: sq heparin; HgB down to 8.9, PLT high stable Hepatobil: LFTs trend back down (AST 94 / ALT 114), albumin 2.1, tbili down to WNL, TG up to 316 Neuro: Transient confusion. Scheduled APAP ID: Zosyn postop for gross contamination with stool perforated colon, now Dapto / Eraxis added - Tmax/24h 100.3, WBC down to 20.3. BCx - NGTD  TPN Access: CVC TPN start date: 07/16/18 Nutritional Goals (per RD rec on 10/14): KCal: 2400-2600  KCal Protein: 120-135 g  Fluid: >/= 2L/day  Goal TPN rate is 100 ml/hr (provides 132g protein and 2472 KCal - 100% goal)  Current Nutrition:  NPO TPN  Plan:  Continue TPN at 100 mL/hr - This TPN provides 132 g of protein, 360 g of dextrose, and 72 g of lipids which provides 2472 kCals per day, meeting 100% of patient needs  Electrolytes in TPN: Removed sodium, remove K+ temporarily, remove magnesium, remove calcium. Removed Phos due to cation balance. Continue Cl:Ac at max acetate  Add daily MVI, trace elements, Pepcid 20 mg to TPN Discussed with Dr. Thereasa Solo - D/c D5W at 74ml/hr Continue rSSI + Lantus 35 units BID (hopefully can reduce in next few days) + increase insulin in TPN bag to 75 units - monitor CBGs and adjust as needed *Decreasing daily dextrose intake by ~20% by d/c of D5W - will be conservative with insulin increase today and re-evaluate tomorrow off D5W  Monitor TPN labs, triglyceride trend  Elicia Lamp, PharmD, BCPS Clinical Pharmacist Clinical phone [phone] Please check AMION for all [place] contact numbers 07/23/2018 10:20 AM

## 2018-07-23 NOTE — Progress Notes (Signed)
[place] TEAM 1 - Stepdown/ICU TEAM  Smitty Ackerley  YQM:[id number] DOB: [birth year] DOA: 07/15/2018 PCP: Ollen Bowl, MD    Brief Narrative:  62 yo M with hx of HTN and DM2 who was transferred from an OSH with 2-3 days of abd pain w/ N/V. He was found to have an acuteperforated obstructing large carcinoma of the splenic flexure of the colon with suspectedlymphatic metastases, peritoneal carcinomatosis, liver metastases, and invasion of the left kidney. He was taken to the OR 10/9 for ex lap and liver biopsy s/p colectomy and open abd with wound vac.  Significant Events: 10/8 transfer to [facility] from OSH 10/9 ex lap, left colectomy, biopsy of liver lesion 10/14 PICC placed 10/15 TNA initiated - TR[facility] assumed care   Subjective: Resting comfortably in bed. Sleeping and difficult to awake. Confused upon waking. No apparent pain or resp distress. Denies complaints.   Assessment & Plan:  Acute Respiratory Failure in post operative setting Now off vent and doing well on minimal O2 support   Acute perforation due to large obstructing mass of the splenic flexure with lymphatic metastases peritoneal carcinomatosis and liver mets - Metastatic Adenocarcinoma of colon Post-op care per [facility] - Oncology has evaluated - plan is to f/u at the [facility] at request of patient   Persistent fever - intra-abdominal fluid collections S/p percutaneous drain placement x2 10/18 per IR - remains on very broad antimicrobial coverage   Encephalopathy: Combination of critical illness, hospitalization, and metabolic derangements including hypernatremia Noncontrast head CT on 07/20/2018 unremarkable - MRI brain ordered 10/20 but not yet accomplished   Hypernatremia Pharmacy adjusting TNA  Nutrition  TNA dependent at present - diet per [facility]  Acute kidney injury  Baseline crt appears to have been ~1.0 - follow trend - perhaps slowly improving at this time  Recent Labs  Lab 07/20/18 0342  07/21/18 0035 07/21/18 0352 07/22/18 0409 07/23/18 0346  CREATININE 3.66* 4.33* 4.19* 4.12* 3.88*    HTN BP reasonably controlled for this clinical situation - follow w/o change today  DM2 CBG not controlled - stopping dextrose IVF which was being given in addition to TNA   DVT prophylaxis: lovenox  Code Status: FULL CODE Family Communication: no family present at time of exam  Disposition Plan: SDU  Consultants:  CCS PCCM Oncology   Antimicrobials:  Zosyn 10/7 > Daptomycin 10/20 > Andidlafungin 10/20 >  Objective: Blood pressure 110/78, pulse (!) 102, temperature 100.3 F (37.9 C), temperature source Oral, resp. rate (!) 36, height 6' (1.829 m), weight 112.7 kg, SpO2 100 %.  Intake/Output Summary (Last 24 hours) at 07/23/2018 1426 Last data filed at 07/23/2018 1000 Gross per 24 hour  Intake 4018.14 ml  Output 4065 ml  Net -46.86 ml   Filed Weights   07/20/18 0500 07/22/18 0500 07/23/18 0500  Weight: 112.7 kg 111.2 kg 112.7 kg    Examination: General: No acute respiratory distress - confused  Lungs: Clear to auscultation bilaterally  Cardiovascular: Regular rate and rhythm without murmur  Abdomen: ostomy in R mid abdom - no rebound, no mass, soft  Extremities: no signif edema bilateral lower extremities  CBC: Recent Labs  Lab 07/17/18 0346  07/21/18 0035 07/21/18 0352 07/23/18 0346  WBC 16.2*   < > 23.3* 22.9* 20.3*  NEUTROABS 11.7*  --   --   --  15.6*  HGB 8.8*   < > 9.8* 9.7* 8.9*  HCT 27.6*   < > 32.2* 31.1* 29.5*  MCV 88.7   < >  92.0 92.0 93.4  PLT 318   < > 579* 597* 599*   < > = values in this interval not displayed.   Basic Metabolic Panel: Recent Labs  Lab 07/19/18 0319 07/20/18 0342  07/21/18 0352 07/22/18 0409 07/23/18 0346  NA  --  152*   < > 153* 156* 153*  K  --  4.3   < > 4.5 5.0 4.5  CL  --  117*   < > 119* 126* 129*  CO2  --  20*   < > 19* 19* 18*  GLUCOSE  --  273*   < > 326* 280* 267*  BUN  --  85*   < > 98* 99* 88*   CREATININE  --  3.66*   < > 4.19* 4.12* 3.88*  CALCIUM  --  9.5   < > 9.2 9.1 9.1  MG 2.6* 2.6*  --   --  2.3 2.1  PHOS 4.6 4.2  --   --   --  3.6   < > = values in this interval not displayed.   GFR: Estimated Creatinine Clearance: 25.2 mL/min (A) (by C-G formula based on SCr of 3.88 mg/dL (H)).  Liver Function Tests: Recent Labs  Lab 07/17/18 0346 07/19/18 0009 07/21/18 0035 07/23/18 0346  AST 73* 137* 148* 94*  ALT 40 94* 137* 114*  ALKPHOS 84 119 128* 125  BILITOT 3.0* 1.9* 1.4* 1.0  PROT 7.6 7.8 8.1 8.8*  ALBUMIN 2.8* 2.5* 2.4* 2.1*    Coagulation Profile: Recent Labs  Lab 07/21/18 0352  INR 1.50   HbA1C: Hgb A1c MFr Bld  Date/Time Value Ref Range Status  07/14/2018 04:32 AM 7.4 (H) 4.8 - 5.6 % Final    Comment:    (NOTE) Pre diabetes:          5.7%-6.4% Diabetes:              >6.4% Glycemic control for   <7.0% adults with diabetes   07/07/2018 05:53 AM 7.5 (H) 4.8 - 5.6 % Final    Comment:    (NOTE) Pre diabetes:          5.7%-6.4% Diabetes:              >6.4% Glycemic control for   <7.0% adults with diabetes     CBG: Recent Labs  Lab 07/22/18 2042 07/22/18 2359 07/23/18 0420 07/23/18 0830 07/23/18 1214  GLUCAP 211* 207* 223* 265* 249*    Recent Results (from the past 240 hour(s))  Aerobic/Anaerobic Culture (surgical/deep wound)     Status: None (Preliminary result)   Collection Time: 07/20/18  5:43 PM  Result Value Ref Range Status   Specimen Description LIVER  Final   Special Requests NONE  Final   Gram Stain   Final    RARE WBC PRESENT, PREDOMINANTLY PMN NO ORGANISMS SEEN    Culture   Final    NO GROWTH 3 DAYS NO ANAEROBES ISOLATED; CULTURE IN PROGRESS FOR 5 DAYS Performed at [facility], [address] [place], [place] [address]    Report Status PENDING  Incomplete  Culture, blood (routine x 2)     Status: None (Preliminary result)   Collection Time: 07/21/18  8:43 AM  Result Value Ref Range Status   Specimen  Description BLOOD LEFT ANTECUBITAL  Final   Special Requests   Final    BOTTLES DRAWN AEROBIC AND ANAEROBIC Blood Culture results may not be optimal due to an excessive volume of  blood received in culture bottles   Culture   Final    NO GROWTH 2 DAYS Performed at [facility], [place] [address] [place], [place] [address]    Report Status PENDING  Incomplete  Culture, blood (routine x 2)     Status: None (Preliminary result)   Collection Time: 07/21/18  8:49 AM  Result Value Ref Range Status   Specimen Description BLOOD LEFT HAND  Final   Special Requests   Final    BOTTLES DRAWN AEROBIC ONLY Blood Culture adequate volume   Culture   Final    NO GROWTH 2 DAYS Performed at [facility], [place] [address], [place], [place] [address]    Report Status PENDING  Incomplete     Scheduled Meds: . chlorhexidine  15 mL Mouth Rinse BID  . heparin injection (subcutaneous)  5,000 Units Subcutaneous Q8H  . insulin aspart  0-20 Units Subcutaneous Q4H  . insulin glargine  35 Units Subcutaneous BID  . mouth rinse  15 mL Mouth Rinse q12n4p  . sodium chloride flush  5 mL Intracatheter Q8H   Continuous Infusions: . anidulafungin    . DAPTOmycin (CUBICIN)  IV Stopped (07/22/18 2134)  . piperacillin-tazobactam (ZOSYN)  IV 12.5 mL/hr at 07/23/18 1000  . TPN ADULT (ION) 100 mL/hr at 07/23/18 0645  . TPN ADULT (ION)       LOS: 13 days   Cherene Altes, MD [facility] Office  [phone] Pager - Text Page per Amion  If 7PM-7AM, please contact night-coverage per Amion 07/23/2018, 2:26 PM

## 2018-07-23 NOTE — Progress Notes (Signed)
Occupational Therapy Treatment Patient Details Name: Jerry Green MRN: [id number] DOB: [birth year] Today's Date: 07/23/2018    History of present illness  Jerry Green is a 62 y.o. male with medical history significant of HTN, DM type 2, and [name]; who initially presented to [facility] with complaints of progressively worsening right lower quadrant abdominal pain over the last 3-4 days. 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  Taken to OR 10/9 for ex lap and liver biopsy s/p colectomy and open abd with wound vac.   OT comments  Pt with fluctuating cognition this session requiring increased time and cues to follow commands. Pt declined performing grooming ADLs, completed bil UE exercises given increased time and cues to perform as pt easily distractible. Continue to recommend SNF at time of discharge. Will continue to follow acutely to progress pt towards established OT goals.    Follow Up Recommendations  SNF;Supervision/Assistance - 24 hour    Equipment Recommendations  Other (comment)(TBD in next venue)          Precautions / Restrictions Precautions Precautions: Fall;Other (comment) Precaution Comments: wound vac and jp at abdomen Restrictions Weight Bearing Restrictions: No                                                     ADL either performed or assessed with clinical judgement   ADL Overall ADL's : Needs assistance/impaired                                       General ADL Comments: limited session as pt with increased confusion this session, attempted to have pt perform simple grooming ADLs however pt declining, states he will perform at a later time; attempted to have pt readjust  himself in bed and pt refuses, stating "I like being crooked". Pt RR fluctuating between high 20s and intermittently up to low 40's; performed UE HEP given increased time/cues to perform                       Cognition Arousal/Alertness: Awake/alert Behavior During Therapy: Restless Overall Cognitive Status: Impaired/Different from baseline Area of Impairment: Attention;Following commands;Problem solving                   Current Attention Level: Sustained   Following Commands: Follows one step commands with increased time;Follows one step commands inconsistently     Problem Solving: Slow processing;Decreased initiation;Requires verbal cues;Requires tactile cues General Comments: pt appearing more confused this AM and with increased difficulty following commands, requiring increased time and multimodal cues to do so        Exercises Exercises: General Upper Extremity General Exercises - Upper Extremity Shoulder Flexion: AROM;AAROM;10 reps;Supine Shoulder Extension: AROM;AAROM;10 reps;Supine   Shoulder Instructions       General Comments      Pertinent Vitals/ Pain       Pain Assessment: Faces Faces Pain Scale: No hurt Pain Intervention(s): Monitored during session  Home Living  Frequency  Min 2X/week        Progress Toward Goals  OT Goals(current goals can now be found in the care plan section)  Progress towards OT goals: Progressing toward goals  Acute Rehab OT Goals Patient Stated Goal: get stronger OT Goal Formulation: With patient Time For Goal Achievement: 07/31/18 Potential to Achieve Goals: Good  Plan Discharge plan remains appropriate    Co-evaluation                 AM-PAC PT "6 Clicks" Daily Activity     Outcome Measure   Help from another person eating meals?: None Help from another person taking care of personal grooming?: A Little Help from  another person toileting, which includes using toliet, bedpan, or urinal?: Total Help from another person bathing (including washing, rinsing, drying)?: A Lot Help from another person to put on and taking off regular upper body clothing?: A Lot Help from another person to put on and taking off regular lower body clothing?: Total 6 Click Score: 13    End of Session    OT Visit Diagnosis: Unsteadiness on feet (R26.81);Other abnormalities of gait and mobility (R26.89);Muscle weakness (generalized) (M62.81)   Activity Tolerance Patient tolerated treatment well   Patient Left in bed;with call bell/phone within reach;with bed alarm set   Nurse Communication Mobility status        Time: 2703-5009 OT Time Calculation (min): 15 min  Charges: OT General Charges $OT Visit: 1 Visit OT Treatments $Therapeutic Activity: 8-22 mins  Lou Cal, OT [facility] Pager [phone] Office [phone]    Raymondo Band 07/23/2018, 1:44 PM

## 2018-07-23 NOTE — Progress Notes (Signed)
   07/23/18 0210  Incision (Closed) 07/20/18 Abdomen Mid  Date First Assessed/Time First Assessed: 07/20/18 1930   Location: Abdomen  Location Orientation: Mid  Dressing Type ABD;Gauze (Comment);Moist to dry  Dressing Changed  Dressing Change Frequency Twice a day  Site / Wound Assessment Red;Yellow  Margins Unattached edges (unapproximated)  Closure None  Drainage Amount Moderate  Drainage Description Serosanguineous  Treatment Cleansed  Closed System Drain 1 Left Abdomen Bulb (JP) 19 Fr.  Placement Date/Time: 07/12/2018 1159   Person Inserting Catheter: Dr. Lauraine Rinne Number: 1  Orientation: Left  Location: Abdomen  Drain Tube Type: Bulb (JP)  Size (Fr.): 19 Fr.  Site Description Unremarkable  Dressing Status  (Changed)  Drainage Appearance Serous  Status To suction (Charged)  Closed System Drain 5 Right RUQ Bulb (JP)  Placement Date/Time: 07/20/18 1700   Tube Number: 5  Orientation: Right  Location: RUQ  Drain Tube Type: (c) Bulb (JP)  Site Description Unremarkable  Dressing Status Other (Comment) (Changed)  Drainage Appearance Bloody  Status To suction (Charged)  Closed System Drain 6 Left LLQ Other (Comment)  Placement Date/Time: 07/20/18 1700   Tube Number: 6  Orientation: Left  Location: LLQ  Drain Tube Type: (c) Other (Comment)  Site Description Unremarkable  Dressing Status Other (Comment) (Changed)  Drainage Appearance Yellow;Cloudy  Status To gravity (Uncharged)   Dressings changed per orders. Patient tolerated well. Abdominal binder re-applied per orders.

## 2018-07-23 NOTE — Progress Notes (Signed)
Physical Therapy Treatment Patient Details Name: Jerry Green MRN: [id number] DOB: May 11, 1956 Today's Date: 07/23/2018    History of Present Illness  Jerry Green is a 62 y.o. male with medical history significant of HTN, DM type 2, and Gerd; who initially presented to [facility] with complaints of progressively worsening right lower quadrant abdominal pain over the last 3-4 days. 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  Taken to OR 10/9 for ex lap and liver biopsy s/p colectomy and open abd with wound vac.    PT Comments    Patient asleep when PT arrived. Patient lethargic while completing therapeutic exercises. Patient required assistance 2+ for bed mobility and transfers. Ambulation not attempted today. Patient unable to stand without maximal support. Nursing requested PT assistance to slide transfer patient from hospital bed to specialty bed with max assist 4+.   Patient would continue to benefit from skilled physical therapy in current environment and next venue to continue return to prior function and increase strength, endurance, balance, coordination, and functional mobility and gait skills.    Follow Up Recommendations  CIR     Equipment Recommendations  None recommended by PT(TBD )    Recommendations for Other Services       Precautions / Restrictions Precautions Precautions: Fall;Other (comment) Precaution Comments: wound vac and jp at abdomen Restrictions Weight Bearing Restrictions: No    Mobility  Bed Mobility Overal bed mobility: Needs Assistance Bed Mobility: Rolling;Sit to Sidelying Rolling: Mod assist;+2 for physical assistance;+2 for safety/equipment       Sit to sidelying: Mod assist;+2 for physical  assistance;+2 for safety/equipment General bed mobility comments: Pt required assistance to lower trunk back to bed and maintain sidelying to reduce pain in abdomen.  Pt also required assistance to lift B LEs back to bed.    Transfers Overall transfer level: Needs assistance Equipment used: Rolling walker (2 wheeled) Transfers: Sit to/from Stand Sit to Stand: +2 safety/equipment;Max assist;+2 physical assistance;From elevated surface            Ambulation/Gait             General Gait Details: Pt required cues to turn, sidestep and back to seated surface.  Assistance to turn RW.     Stairs             Wheelchair Mobility    Modified Rankin (Stroke Patients Only)       Balance Overall balance assessment: Needs assistance Sitting-balance support: Feet supported;Bilateral upper extremity supported Sitting balance-Leahy Scale: Poor     Standing balance support: Bilateral upper extremity supported;During functional activity Standing balance-Leahy Scale: Poor Standing balance comment: dependent on RW and physical assist                            Cognition Arousal/Alertness: Awake/alert Behavior During Therapy: WFL for tasks assessed/performed Overall Cognitive Status: Within Functional Limits for tasks assessed Area of Impairment: Attention;Following commands;Problem solving                   Current Attention Level: Sustained   Following Commands: Follows one step commands with increased time;Follows one step commands inconsistently     Problem Solving: Slow processing;Decreased initiation;Requires verbal cues;Requires tactile cues General Comments: pt appearing more  confused this AM and with increased difficulty following commands, requiring increased time and multimodal cues to do so      Exercises General Exercises - Upper Extremity Shoulder Flexion: AROM;AAROM;10 reps;Supine Shoulder Extension: AROM;AAROM;10 reps;Supine     General Comments        Pertinent Vitals/Pain Pain Assessment: Faces Faces Pain Scale: No hurt Pain Intervention(s): Monitored during session    Home Living                      Prior Function            PT Goals (current goals can now be found in the care plan section) Acute Rehab PT Goals Patient Stated Goal: get stronger Potential to Achieve Goals: Good Progress towards PT goals: Progressing toward goals    Frequency    Min 4X/week      PT Plan Current plan remains appropriate    Co-evaluation              AM-PAC PT "6 Clicks" Daily Activity  Outcome Measure  Difficulty turning over in bed (including adjusting bedclothes, sheets and blankets)?: Unable Difficulty moving from lying on back to sitting on the side of the bed? : Unable Difficulty sitting down on and standing up from a chair with arms (e.g., wheelchair, bedside commode, etc,.)?: Unable Help needed moving to and from a bed to chair (including a wheelchair)?: A Lot Help needed walking in hospital room?: A Lot Help needed climbing 3-5 steps with a railing? : Total 6 Click Score: 8    End of Session Equipment Utilized During Treatment: Gait belt Activity Tolerance: Patient tolerated treatment well Patient left: in bed;with call bell/phone within reach Nurse Communication: Mobility status PT Visit Diagnosis: Unsteadiness on feet (R26.81);Difficulty in walking, not elsewhere classified (R26.2);Muscle weakness (generalized) (M62.81)     Time: 0511-0211 PT Time Calculation (min) (ACUTE ONLY): 30 min  Charges:  $Therapeutic Exercise: 8-22 mins $Therapeutic Activity: 8-22 mins                     Floria Raveling. Hartnett-Rands, MS, PT Per [place] #[id number] 07/23/2018, 2:24 PM

## 2018-07-23 NOTE — Progress Notes (Signed)
During report this AM with dayshift RN York Cerise, pt was lying comfortably in the bed with abdominal dressings clean, dry, and intact with abdominal binder applied appropriately. Pt denies any needs at this time. Upon exiting room, patient resting comfortably in bed.

## 2018-07-24 ENCOUNTER — Inpatient Hospital Stay (HOSPITAL_COMMUNITY): Payer: Medicaid - Out of State

## 2018-07-24 LAB — PHOSPHORUS: Phosphorus: 4.2 mg/dL (ref 2.5–4.6)

## 2018-07-24 LAB — BASIC METABOLIC PANEL
Anion gap: 11 (ref 5–15)
BUN: 84 mg/dL — AB (ref 8–23)
CALCIUM: 9.5 mg/dL (ref 8.9–10.3)
CHLORIDE: 124 mmol/L — AB (ref 98–111)
CO2: 16 mmol/L — ABNORMAL LOW (ref 22–32)
CREATININE: 3.8 mg/dL — AB (ref 0.61–1.24)
GFR calc Af Amer: 18 mL/min — ABNORMAL LOW (ref >60)
GFR, EST NON AFRICAN AMERICAN: 16 mL/min — AB (ref >60)
Glucose, Bld: 249 mg/dL — ABNORMAL HIGH (ref 70–99)
Potassium: 4.3 mmol/L (ref 3.5–5.1)
SODIUM: 151 mmol/L — AB (ref 135–145)

## 2018-07-24 LAB — GLUCOSE, CAPILLARY
GLUCOSE-CAPILLARY: 203 mg/dL — AB (ref 70–99)
GLUCOSE-CAPILLARY: 222 mg/dL — AB (ref 70–99)
GLUCOSE-CAPILLARY: 250 mg/dL — AB (ref 70–99)
GLUCOSE-CAPILLARY: 212 mg/dL — AB (ref 70–99)
Glucose-Capillary: 210 mg/dL — ABNORMAL HIGH (ref 70–99)
Glucose-Capillary: 216 mg/dL — ABNORMAL HIGH (ref 70–99)
Glucose-Capillary: 247 mg/dL — ABNORMAL HIGH (ref 70–99)
Glucose-Capillary: 255 mg/dL — ABNORMAL HIGH (ref 70–99)

## 2018-07-24 LAB — CBC
HCT: 28.7 % — ABNORMAL LOW (ref 39.0–52.0)
Hemoglobin: 8.8 g/dL — ABNORMAL LOW (ref 13.0–17.0)
MCH: 28.7 pg (ref 26.0–34.0)
MCHC: 30.7 g/dL (ref 30.0–36.0)
MCV: 93.5 fL (ref 80.0–100.0)
NRBC: 0 % (ref 0.0–0.2)
PLATELETS: 552 10*3/uL — AB (ref 150–400)
RBC: 3.07 MIL/uL — ABNORMAL LOW (ref 4.22–5.81)
RDW: 15.2 % (ref 11.5–15.5)
WBC: 18.3 10*3/uL — ABNORMAL HIGH (ref 4.0–10.5)

## 2018-07-24 LAB — MAGNESIUM: MAGNESIUM: 2 mg/dL (ref 1.7–2.4)

## 2018-07-24 MED ORDER — ACETAMINOPHEN 160 MG/5ML PO SOLN: 650.0000 mg | Freq: Four times a day (QID) | ORAL | Status: DC | PRN

## 2018-07-24 MED ORDER — TRAVASOL 10 % IV SOLN
INTRAVENOUS | Status: AC
  Administered 2018-07-24: 18:00:00 via INTRAVENOUS
  Filled 2018-07-24: qty 1320

## 2018-07-24 MED ORDER — INSULIN GLARGINE 100 UNIT/ML ~~LOC~~ SOLN
35.0000 [IU] | Freq: Every day | SUBCUTANEOUS | Status: DC
  Administered 2018-07-25: 35 [IU] via SUBCUTANEOUS
  Filled 2018-07-24: qty 0.35

## 2018-07-24 NOTE — Progress Notes (Addendum)
[place] TEAM 1 - Stepdown/ICU TEAM  Jerry Green  BUL:[id number] DOB: 11-Feb-1956 DOA: 07/06/2018 PCP: Ollen Bowl, MD    Brief Narrative:  62 yo M with hx of HTN and DM2 who was transferred from an OSH with 2-3 days of abd pain w/ N/V. He was found to have an acuteperforated obstructing large carcinoma of the splenic flexure of the colon with suspectedlymphatic metastases, peritoneal carcinomatosis, liver metastases, and invasion of the left kidney. He was taken to the OR 10/9 for ex lap and liver biopsy s/p colectomy and open abd with wound vac.  Significant Events: 10/8 transfer to [facility] from OSH 10/9 ex lap, left colectomy, biopsy of liver lesion 10/14 PICC placed 10/15 TNA initiated - [facility] assumed care   Subjective: Pt is awake but confused. He tells me he is in [place]. He tells me he is hungry. He denies cp, sob, n/v, or abdom pain.   Assessment & Plan:  Encephalopathy: Combination of critical illness, hospitalization, and uremia/metabolic derangements  Noncontrast head CT on 07/20/2018 unremarkable - MRI pending - persists - check B12, folate, ammonia  Acute Respiratory Failure in post operative setting Essentially resolved   Acute perforation due to large obstructing mass of the splenic flexure with lymphatic metastases peritoneal carcinomatosis and liver mets - Metastatic Adenocarcinoma of colon Post-op care per [facility] - Oncology has evaluated - plan is to f/u at the [facility] at request of patient   Persistent fever - intra-abdominal fluid collections S/p percutaneous drain placement x2 10/18 per IR - remains on very broad antimicrobial coverage - continues to have intermittent significant fevers - agree w/ f/u CT as suggested per [facility]  Hypernatremia Pharmacy adjusting TNA - follow trend   Nutrition  TNA dependent at present - diet per [facility] - to try clear liquids today   Acute kidney injury  Baseline crt appears to have been ~1.0 - follow trend - perhaps  slowly improving at this time   Recent Labs  Lab 07/21/18 0035 07/21/18 0352 07/22/18 0409 07/23/18 0346 07/24/18 0420  CREATININE 4.33* 4.19* 4.12* 3.88* 3.80*    HTN BP reasonably controlled for this clinical situation - follow w/o change today  DM2 CBG not controlled - pharmacy to adjust insulin in TNA  DVT prophylaxis: lovenox  Code Status: FULL CODE Family Communication: no family present at time of exam  Disposition Plan: SDU  Consultants:  [facility] PCCM Oncology   Antimicrobials:  Zosyn 10/7 > Daptomycin 10/20 > Andidlafungin 10/20 >  Objective: Blood pressure 138/84, pulse (!) 110, temperature 99.3 F (37.4 C), temperature source Oral, resp. rate (!) 21, height 6' (1.829 m), weight (!) 142.4 kg, SpO2 96 %.  Intake/Output Summary (Last 24 hours) at 07/24/2018 1129 Last data filed at 07/24/2018 1000 Gross per 24 hour  Intake 3028.22 ml  Output 2653 ml  Net 375.22 ml   Filed Weights   07/22/18 0500 07/23/18 0500 07/24/18 0500  Weight: 111.2 kg 112.7 kg (!) 142.4 kg    Examination: General: No acute respiratory distress - confused but alert  Lungs: CTA B - no wheezing  Cardiovascular: RRR - no M or rub  Abdomen: ostomy in R mid abdom - no rebound, no mass, soft  Extremities: trace B LE edema   CBC: Recent Labs  Lab 07/21/18 0352 07/23/18 0346 07/24/18 0850  WBC 22.9* 20.3* 18.3*  NEUTROABS  --  15.6*  --   HGB 9.7* 8.9* 8.8*  HCT 31.1* 29.5* 28.7*  MCV 92.0 93.4  93.5  PLT 597* 599* 528*   Basic Metabolic Panel: Recent Labs  Lab 07/20/18 0342  07/22/18 0409 07/23/18 0346 07/24/18 0420  NA 152*   < > 156* 153* 151*  K 4.3   < > 5.0 4.5 4.3  CL 117*   < > 126* 129* 124*  CO2 20*   < > 19* 18* 16*  GLUCOSE 273*   < > 280* 267* 249*  BUN 85*   < > 99* 88* 84*  CREATININE 3.66*   < > 4.12* 3.88* 3.80*  CALCIUM 9.5   < > 9.1 9.1 9.5  MG 2.6*  --  2.3 2.1 2.0  PHOS 4.2  --   --  3.6 4.2   < > = values in this interval not displayed.    GFR: Estimated Creatinine Clearance: 29.1 mL/min (A) (by C-G formula based on SCr of 3.8 mg/dL (H)).  Liver Function Tests: Recent Labs  Lab 07/19/18 0009 07/21/18 0035 07/23/18 0346  AST 137* 148* 94*  ALT 94* 137* 114*  ALKPHOS 119 128* 125  BILITOT 1.9* 1.4* 1.0  PROT 7.8 8.1 8.8*  ALBUMIN 2.5* 2.4* 2.1*    Coagulation Profile: Recent Labs  Lab 07/21/18 0352  INR 1.50   HbA1C: Hgb A1c MFr Bld  Date/Time Value Ref Range Status  07/14/2018 04:32 AM 7.4 (H) 4.8 - 5.6 % Final    Comment:    (NOTE) Pre diabetes:          5.7%-6.4% Diabetes:              >6.4% Glycemic control for   <7.0% adults with diabetes   07/29/2018 05:53 AM 7.5 (H) 4.8 - 5.6 % Final    Comment:    (NOTE) Pre diabetes:          5.7%-6.4% Diabetes:              >6.4% Glycemic control for   <7.0% adults with diabetes     CBG: Recent Labs  Lab 07/23/18 2016 07/23/18 2356 07/24/18 0415 07/24/18 0431 07/24/18 0841  GLUCAP 221* 221* 203* 222* 250*    Recent Results (from the past 240 hour(s))  Aerobic/Anaerobic Culture (surgical/deep wound)     Status: None (Preliminary result)   Collection Time: 07/20/18  5:43 PM  Result Value Ref Range Status   Specimen Description LIVER  Final   Special Requests NONE  Final   Gram Stain   Final    RARE WBC PRESENT, PREDOMINANTLY PMN NO ORGANISMS SEEN    Culture   Final    NO GROWTH 4 DAYS NO ANAEROBES ISOLATED; CULTURE IN PROGRESS FOR 5 DAYS Performed at [facility], [place] [address], [place], [place] [address]    Report Status PENDING  Incomplete  Culture, blood (routine x 2)     Status: None (Preliminary result)   Collection Time: 07/21/18  8:43 AM  Result Value Ref Range Status   Specimen Description BLOOD LEFT ANTECUBITAL  Final   Special Requests   Final    BOTTLES DRAWN AEROBIC AND ANAEROBIC Blood Culture results may not be optimal due to an excessive volume of blood received in culture bottles   Culture   Final    NO GROWTH  3 DAYS Performed at [facility], [place] [address] [place], [place] [address]    Report Status PENDING  Incomplete  Culture, blood (routine x 2)     Status: None (Preliminary result)   Collection Time: 07/21/18  8:49 AM  Result Value Ref Range Status   Specimen Description BLOOD LEFT HAND  Final   Special Requests   Final    BOTTLES DRAWN AEROBIC ONLY Blood Culture adequate volume   Culture   Final    NO GROWTH 3 DAYS Performed at [facility], [address], [place], [place] [address]    Report Status PENDING  Incomplete     Scheduled Meds: . chlorhexidine  15 mL Mouth Rinse BID  . heparin injection (subcutaneous)  5,000 Units Subcutaneous Q8H  . insulin aspart  0-20 Units Subcutaneous Q4H  . [START ON 07/25/2018] insulin glargine  35 Units Subcutaneous Daily  . mouth rinse  15 mL Mouth Rinse q12n4p  . sodium chloride flush  5 mL Intracatheter Q8H   Continuous Infusions: . anidulafungin Stopped (07/23/18 1741)  . DAPTOmycin (CUBICIN)  IV Stopped (07/22/18 2134)  . piperacillin-tazobactam (ZOSYN)  IV 12.5 mL/hr at 07/24/18 1000  . TPN ADULT (ION) 100 mL/hr at 07/24/18 1000  . TPN ADULT (ION)       LOS: 14 days   Cherene Altes, MD [facility] Office  [phone] Pager - Text Page per Amion  If 7PM-7AM, please contact night-coverage per Amion 07/24/2018, 11:29 AM

## 2018-07-24 NOTE — Progress Notes (Signed)
Physical Therapy Treatment Patient Details Name: Robson Trickey MRN: [id number] DOB: 09/07/1956 Today's Date: 07/24/2018    History of Present Illness  Jerry Green is a 62 y.o. male with medical history significant of HTN, DM type 2, and Gerd; who initially presented to [facility] with complaints of progressively worsening right lower quadrant abdominal pain over the last 3-4 days. 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  Taken to OR 10/9 for ex lap and liver biopsy s/p colectomy and open abd with wound vac.    PT Comments    Pt confused today, but able to follow simple instruction given time.  Balance in sitting and standing was poor with pt listing posteriorly, so heavily in standing that pt allow to sit for taking BP.  Further standing showed pt to be uncoordinated and having difficulty moving R> L LE to pivot to chair.    Follow Up Recommendations  CIR     Equipment Recommendations  None recommended by PT    Recommendations for Other Services       Precautions / Restrictions Precautions Precautions: Fall    Mobility  Bed Mobility Overal bed mobility: Needs Assistance Bed Mobility: Rolling;Sidelying to Sit Rolling: Mod assist;+2 for physical assistance Sidelying to sit: Mod assist;+2 for physical assistance       General bed mobility comments: cues for direction and sequencing and truncal assist for roll and up.  Transfers Overall transfer level: Needs assistance Equipment used: Rolling walker (2 wheeled) Transfers: Sit to/from [facility] Sit to Stand: Max assist;+2 physical assistance;+2 safety/equipment;Mod assist Stand pivot transfers: Mod assist;+2 physical assistance       General transfer  comment: Once up, pt quickly started listing heavily posteriorly.  Pt needed stability assist and assist to move the RW during pivot transfer.  Ambulation/Gait             General Gait Details: pivot transfer only   Stairs             Wheelchair Mobility    Modified Rankin (Stroke Patients Only)       Balance Overall balance assessment: Needs assistance   Sitting balance-Leahy Scale: Poor Sitting balance - Comments: With any challenge or sitting on air matress, pt leans posteriorly unless assisted   Standing balance support: Bilateral upper extremity supported;During functional activity Standing balance-Leahy Scale: Poor Standing balance comment: dependent on RW and physical assist.  listing posteriorly                            Cognition Arousal/Alertness: Awake/alert Behavior During Therapy: WFL for tasks assessed/performed Overall Cognitive Status: Within Functional Limits for tasks assessed Area of Impairment: Attention;Following commands;Problem solving                   Current Attention Level: Sustained   Following Commands: Follows one step commands with increased time     Problem Solving: Slow processing;Decreased initiation;Requires verbal cues;Requires tactile cues;Difficulty sequencing        Exercises Other Exercises Other Exercises: bil hip/knee flexion/ext with graded resistance x10 reps    General Comments General comments (skin integrity, edema, etc.): During session HR rose to b/w 125 and 130's sustained.  Sats 100%, RR b/w 24-50 rpm.  BP in sitting just after standing 125/69 (84), and later in sitting 139/85      Pertinent Vitals/Pain Pain Assessment: Faces Faces Pain Scale: Hurts a little bit Pain Location: surgical site Pain Descriptors / Indicators: Aching;Grimacing Pain Intervention(s): Monitored during session    Home Living                      Prior Function            PT Goals (current  goals can now be found in the care plan section) Acute Rehab PT Goals Patient Stated Goal: get stronger PT Goal Formulation: With patient Time For Goal Achievement: 07/30/18 Potential to Achieve Goals: Good Progress towards PT goals: Progressing toward goals    Frequency    Min 4X/week      PT Plan Current plan remains appropriate    Co-evaluation              AM-PAC PT "6 Clicks" Daily Activity  Outcome Measure  Difficulty turning over in bed (including adjusting bedclothes, sheets and blankets)?: Unable Difficulty moving from lying on back to sitting on the side of the bed? : Unable Difficulty sitting down on and standing up from a chair with arms (e.g., wheelchair, bedside commode, etc,.)?: Unable Help needed moving to and from a bed to chair (including a wheelchair)?: A Lot Help needed walking in hospital room?: A Lot Help needed climbing 3-5 steps with a railing? : Total 6 Click Score: 8    End of Session   Activity Tolerance: Patient tolerated treatment well Patient left: in chair;with call bell/phone within reach;with chair alarm set Nurse Communication: Mobility status PT Visit Diagnosis: Unsteadiness on feet (R26.81);Other abnormalities of gait and mobility (R26.89);Difficulty in walking, not elsewhere classified (R26.2)     Time: 8309-4076 PT Time Calculation (min) (ACUTE ONLY): 33 min  Charges:  $Therapeutic Activity: 23-37 mins                     07/24/2018  Donnella Sham, PT Acute Rehabilitation Services [phone]  (pager) [phone]  (office)   Tessie Fass Dosia Yodice 07/24/2018, 1:22 PM

## 2018-07-24 NOTE — Progress Notes (Signed)
Patient ID: Jerry Green, male   DOB: Jun 13, 1956, 62 y.o.   MRN: [id number]    13 Days Post-Op  Subjective: Seems encephalopathic, or disoriented.  Mumbles nonsensical things.  Denies much pain.  Temp overnight of 102.  Objective: Vital signs in last 24 hours: Temp:  [98.5 F (36.9 C)-102.1 F (38.9 C)] 99.3 F (37.4 C) (10/22 0437) Pulse Rate:  [97-108] 105 (10/22 0437) Resp:  [26-37] 30 (10/22 0437) BP: (123-138)/(82-93) 138/84 (10/22 0437) SpO2:  [98 %-100 %] 99 % (10/22 0437) Weight:  [142.4 kg] 142.4 kg (10/22 0500) Last BM Date: 07/23/18  Intake/Output from previous day: 10/21 0701 - 10/22 0700 In: 2753.7 [I.V.:2454.7; IV Piggyback:269] Out: 2878 [Urine:2600; Drains:93; Stool:185] Intake/Output this shift: No intake/output data recorded.  PE: Abd: soft, seems minimally tender, midline incision with dehiscence, but no evisceration.  Packed with WD.  IR drains in place with essentially no output.  Surgical drain in place in LLQ also with no currently output.  RUQ colostomy with some bilious output and small amount of air.  Some BS  Lab Results:  Recent Labs    07/23/18 0346  WBC 20.3*  HGB 8.9*  HCT 29.5*  PLT 599*   BMET Recent Labs    07/23/18 0346 07/24/18 0420  NA 153* 151*  K 4.5 4.3  CL 129* 124*  CO2 18* 16*  GLUCOSE 267* 249*  BUN 88* 84*  CREATININE 3.88* 3.80*  CALCIUM 9.1 9.5   PT/INR No results for input(s): LABPROT, INR in the last 72 hours. CMP     Component Value Date/Time   NA 151 (H) 07/24/2018 0420   K 4.3 07/24/2018 0420   CL 124 (H) 07/24/2018 0420   CO2 16 (L) 07/24/2018 0420   GLUCOSE 249 (H) 07/24/2018 0420   BUN 84 (H) 07/24/2018 0420   CREATININE 3.80 (H) 07/24/2018 0420   CALCIUM 9.5 07/24/2018 0420   PROT 8.8 (H) 07/23/2018 0346   ALBUMIN 2.1 (L) 07/23/2018 0346   AST 94 (H) 07/23/2018 0346   ALT 114 (H) 07/23/2018 0346   ALKPHOS 125 07/23/2018 0346   BILITOT 1.0 07/23/2018 0346   GFRNONAA 16 (L) 07/24/2018 0420   GFRAA 18 (L) 07/24/2018 0420   Lipase  No results found for: LIPASE     Studies/Results: No results found.  Anti-infectives: Anti-infectives (From admission, onward)   Start     Dose/Rate Route Frequency Ordered Stop   07/23/18 1500  anidulafungin (ERAXIS) 100 mg in sodium chloride 0.9 % 100 mL IVPB     100 mg 78 mL/hr over 100 Minutes Intravenous Every 24 hours 07/22/18 1420     07/22/18 2000  DAPTOmycin (CUBICIN) 850 mg in sodium chloride 0.9 % IVPB     850 mg 234 mL/hr over 30 Minutes Intravenous Every 48 hours 07/22/18 1459     07/22/18 1530  anidulafungin (ERAXIS) 200 mg in sodium chloride 0.9 % 200 mL IVPB     200 mg 78 mL/hr over 200 Minutes Intravenous  Once 07/22/18 1420 07/22/18 1941   07/10/2018 1800  piperacillin-tazobactam (ZOSYN) IVPB 3.375 g     3.375 g 12.5 mL/hr over 240 Minutes Intravenous Every 8 hours 07/17/2018 1444     08/01/2018 0915  cefoTEtan (CEFOTAN) 2 g in sodium chloride 0.9 % 100 mL IVPB     2 g 200 mL/hr over 30 Minutes Intravenous To Short Stay 07/18/2018 0857 07/31/2018 1900   07/19/2018 0906  cefoTEtan in Dextrose 5% (CEFOTAN) 2-2.08 GM-%(50ML) IVPB  Note to Pharmacy:  Jerry Green   : cabinet override      07/07/2018 6384 08/02/2018 2114   07/29/2018 1200  piperacillin-tazobactam (ZOSYN) IVPB 3.375 g  Status:  Discontinued     3.375 g 12.5 mL/hr over 240 Minutes Intravenous Every 8 hours 07/08/2018 0615 07/15/2018 1341   07/29/2018 0730  cefoTEtan (CEFOTAN) 2 g in sodium chloride 0.9 % 100 mL IVPB  Status:  Discontinued     2 g 200 mL/hr over 30 Minutes Intravenous On call to O.R. 07/24/2018 0723 07/10/2018 0559   07/17/2018 0615  piperacillin-tazobactam (ZOSYN) IVPB 3.375 g     3.375 g 100 mL/hr over 30 Minutes Intravenous  Once 07/31/2018 0612 07/26/2018 1943       Assessment/Plan Acute respiratory failure - on Vent- extubated 10/13 Acute renal failure Hypertension Diabetes GERD Remote tobacco use BMI 36 Anemia - transfused Hypokalemia- resolved    Hypernatremia Hyperchloremia Malnutrition - moderate - prealbumin 12.2 Hypernatremia-151 Post op ileus, improving Confusion  Perforated splenic flexure colon cancer with large and small bowel obstruction Metastatic Colonadenocarcinoma - liver Bx also positive S/PExploratory laparotomy, left colectomy and liver biopsy, 07/24/2018, Jerry Green POD#13 Abdominal wound dehiscence--no evisceration.  Does not need any reoperative surgery. IR abdominal drain placement x 2 - 07/20/18 - minimal output, CX no growth to date  -Interventional radiology following.   -awaiting CBC today.  If WBC still up, may need repeat CT scan sooner rather than later.  Will see what IR thinks. -good BS today and some function from his ostomy.  Going for MRI of his head.  Will give clears when he returns.  ? Need for swallow eval given confusion.  Will d/w primary service. -temp overnight of 102, blood CX NGTD.  FEN: CLD/ IV fluids/TNA ID: Zosyn 10/8 =>>10/20; Cubacin 10/20 -->  DVT: SCD/subcutaneous heparin Foley:none Follow up: Jerry Green     LOS: 14 days    Jerry Green , [facility] 07/24/2018, 9:17 AM Pager: [phone]

## 2018-07-24 NOTE — Progress Notes (Signed)
Referring Physician(s): Dr Thereasa Solo  Supervising Physician: Arne Cleveland  Patient Status:  [facility] - In-pt  Chief Complaint:  Perforated splenic flexure colon cancer with large and small bowel obstruction Metastatic Colonadenocarcinoma  Subjective:  10/18: Perisplenic abscess drain 10 Fr Hepatic abscess drain 10 Fr  Pt up in chair Not coherent really Mumbles but seems to try to communicate Tmax 102.1 last night Drains intact with minimal OP from right abd IR drain More OP from left IR drain  Allergies: Aspirin  Medications: Prior to Admission medications   Medication Sig Start Date End Date Taking? Authorizing Provider  allopurinol (ZYLOPRIM) 100 MG tablet Take 100 mg by mouth daily. 06/14/18  Yes [provider]  enalapril (VASOTEC) 5 MG tablet Take 5 mg by mouth daily. 06/14/18  Yes [provider]  glimepiride (AMARYL) 2 MG tablet Take 2 mg by mouth 2 (two) times daily. 07/04/18  Yes [provider]  metFORMIN (GLUCOPHAGE) 1000 MG tablet Take 1,000 mg by mouth 2 (two) times daily. 06/14/18  Yes [provider]  pantoprazole (PROTONIX) 40 MG tablet Take 40 mg by mouth daily. 06/14/18  Yes [provider]  simvastatin (ZOCOR) 40 MG tablet Take 40 mg by mouth daily. 06/14/18  Yes [provider]     Vital Signs: BP 138/84 (BP Location: Left Arm)   Pulse (!) 110   Temp 99.3 F (37.4 C) (Oral)   Resp (!) 21   Ht 6' (1.829 m)   Wt (!) 314 lb (142.4 kg)   SpO2 96%   BMI 42.59 kg/m   Physical Exam  Skin: Skin is dry.  Sites are clean and dry NT no bleeding OP right JP: cloudy yellow; minimal NGTD  OP left drain to bag: 20 cc in bag; 75 cc yesterday NGTD  Vitals reviewed.   Imaging: Ct Head Wo Contrast  Result Date: 07/21/2018 CLINICAL DATA:  62 y/o M; altered encephalopathy. History of hypertension and diabetes. EXAM: CT HEAD WITHOUT CONTRAST TECHNIQUE: Contiguous axial images were obtained from the  base of the skull through the vertex without intravenous contrast. COMPARISON:  None. FINDINGS: Brain: No evidence of acute infarction, hemorrhage, hydrocephalus, extra-axial collection or mass lesion/mass effect. Nonspecific white matter hypodensities are compatible with mild chronic microvascular ischemic changes and there is mild volume loss of the brain for age. Vascular: Calcific atherosclerosis of carotid siphons. No hyperdense vessel identified. Skull: Normal. Negative for fracture or focal lesion. Sinuses/Orbits: Mild diffuse paranasal sinus mucosal thickening. Normal aeration of the mastoid air cells. Orbits are unremarkable. Other: None. IMPRESSION: 1. No acute intracranial abnormality identified. 2. Mild chronic microvascular ischemic changes and mild volume loss of the brain for age. Electronically Signed   By: Kristine Garbe M.D.   On: 07/21/2018 00:37   Mr Brain Wo Contrast  Result Date: 07/24/2018 CLINICAL DATA:  62 year old male with altered mental status. EXAM: MRI HEAD WITHOUT CONTRAST TECHNIQUE: Multiplanar, multiecho pulse sequences of the brain and surrounding structures were obtained without intravenous contrast. COMPARISON:  Head CT without contrast 07/21/2018 FINDINGS: Brain: No restricted diffusion to suggest acute infarction. No midline shift, mass effect, evidence of mass lesion, ventriculomegaly, extra-axial collection or acute intracranial hemorrhage. Cervicomedullary junction and pituitary are within normal limits. Questionable brainstem and cerebellar volume loss out of proportion to cerebral hemisphere volume loss. Scattered and patchy cerebral white matter T2 and FLAIR hyperintensity. Marginal involvement of the right lentiform nuclei. No cortical encephalomalacia or chronic cerebral blood products identified. The other deep gray matter  nuclei appear within normal limits. No brainstem or cerebellar signal abnormality identified. Vascular: Major intracranial vascular  flow voids are preserved. Skull and upper cervical spine: Negative visible cervical spine. Normal bone marrow signal. Sinuses/Orbits: Normal orbits soft tissues. Trace paranasal sinus mucosal. Other: Mastoids are clear. Grossly normal visible internal auditory structures appear normal. Scalp and face soft tissues appear negative. IMPRESSION: 1.  No acute intracranial abnormality. 2. Questionable disproportionate volume loss of the brainstem and cerebellum, nonspecific. 3. Mild to moderate for age nonspecific signal changes in the cerebral white matter, most commonly due to chronic small vessel disease. Electronically Signed   By: Genevie Ann M.D.   On: 07/24/2018 12:57    Labs:  CBC: Recent Labs    07/21/18 0035 07/21/18 0352 07/23/18 0346 07/24/18 0850  WBC 23.3* 22.9* 20.3* 18.3*  HGB 9.8* 9.7* 8.9* 8.8*  HCT 32.2* 31.1* 29.5* 28.7*  PLT 579* 597* 599* 552*    COAGS: Recent Labs    07/19/2018 0701 07/29/2018 0748 07/27/2018 1402 07/21/18 0352  INR 1.13  --  1.23 1.50  APTT  --  33  --   --     BMP: Recent Labs    07/21/18 0352 07/22/18 0409 07/23/18 0346 07/24/18 0420  NA 153* 156* 153* 151*  K 4.5 5.0 4.5 4.3  CL 119* 126* 129* 124*  CO2 19* 19* 18* 16*  GLUCOSE 326* 280* 267* 249*  BUN 98* 99* 88* 84*  CALCIUM 9.2 9.1 9.1 9.5  CREATININE 4.19* 4.12* 3.88* 3.80*  GFRNONAA 14* 14* 15* 16*  GFRAA 16* 16* 18* 18*    LIVER FUNCTION TESTS: Recent Labs    07/17/18 0346 07/19/18 0009 07/21/18 0035 07/23/18 0346  BILITOT 3.0* 1.9* 1.4* 1.0  AST 73* 137* 148* 94*  ALT 40 94* 137* 114*  ALKPHOS 84 119 128* 125  PROT 7.6 7.8 8.1 8.8*  ALBUMIN 2.8* 2.5* 2.4* 2.1*    Assessment and Plan:  IR drains x 2 Placed 10/18 Left and right upper abd Left with more OP NGTD Will follow  Electronically Signed: Lovelle Lema A, PA-C 07/24/2018, 3:16 PM   I spent a total of 15 Minutes at the the patient's bedside AND on the patient's hospital floor or unit, greater than 50%  of which was counseling/coordinating care for abscess drains x 2

## 2018-07-24 NOTE — Progress Notes (Signed)
PHARMACY - ADULT TOTAL PARENTERAL NUTRITION CONSULT NOTE   Pharmacy Consult for TPN Indication: prolonged ileus  Patient Measurements: Height: 6' (182.9 cm) Weight: (!) 314 lb (142.4 kg) IBW/kg (Calculated) : 77.6 TPN AdjBW (KG): 86.1 Body mass index is 42.59 kg/m.  Assessment:  13 yom transferred from OSH with abdominal pain, N/V and found to have an acute perforated/obstructing large carcinoma in the colon with multiple metastases. S/p OR 10/9 for exlap and liver biopsy (positive) with colectomy. Attempted TF but with almost immediate vomiting. Started TPN for nutrition support with post-op ileus. Pt is at risk for refeeding.   GI: Pre-albumin up to WNL 26.8. S/p colectomy with going ileus - 135mL drain OP, 381mL NG OP. LBM 10/21 - 10/18: Perisplenic abscess drain, Hepatic abscess drain. Famotidine in TPN  Endo: Hx DM (Amaryl, metformin PTA, Not on insulin PTA) - CBGs uncontrolled 221-250.  Insulin requirements last 24 hrs: 46 units rSSI + increased to 75 units in TPN + Lantus 35 units BID Lytes: K down to 4.3, Na down to 151 (D5 stopped 10/21), corr Ca~10.9, Cl down to 124, CO2 down to 16, others WNL Renal: AKI (peak SCr 4.19) with Scr now trending down to 3.8 (baseline~1), BUN down to 84, UOP 0.8 cc/kg/hr Pulm: extubated 10/13 to RA; PRN albuterol Cards: h/o HTN:  BP ok, tachy in 100s AC: sq heparin; HgB down to 8.9, PLT high stable Hepatobil: LFTs trend back down (AST 94 / ALT 114), albumin 2.1, tbili down to WNL, TG up to 316 Neuro: Transient confusion. APAP / morph prn ID: Zosyn postop for gross contamination with stool perforated colon, now Dapto / Eraxis added - Tmax/24h 102.1, WBC down to 20.3. BCx/liver abscess cx - NGTD  TPN Access: CVC TPN start date: 07/16/18 Nutritional Goals (per RD rec on 10/14): KCal: 2400-2600 KCal Protein: 120-135 g  Fluid: >/= 2L/day  Goal TPN rate is 100 ml/hr (provides 132g protein and 2472 KCal - 100% goal)  Current Nutrition:   NPO TPN  Plan:  Continue TPN at 100 mL/hr - This TPN provides 132 g of protein, 360 g of dextrose, and 72 g of lipids which provides 2472 kCals per day, meeting 100% of patient needs  Electrolytes in TPN: Continue all lytes removed for now; Cl:Ac at max acetate Add daily MVI, trace elements, Pepcid 20 mg to TPN  Continue rSSI + reduce Lantus to 35 units daily (hopefully can wean down in next few days) + increase insulin in TPN bag to 125 units - monitor CBGs and adjust as needed  Monitor TPN labs, triglyceride trend  Elicia Lamp, PharmD, BCPS Clinical Pharmacist Clinical phone [phone] Please check AMION for all [place] contact numbers 07/24/2018 9:04 AM

## 2018-07-25 ENCOUNTER — Inpatient Hospital Stay (HOSPITAL_COMMUNITY): Payer: Medicaid - Out of State

## 2018-07-25 DIAGNOSIS — J9601 Acute respiratory failure with hypoxia: Secondary | ICD-10-CM

## 2018-07-25 DIAGNOSIS — K631 Perforation of intestine (nontraumatic): Principal | ICD-10-CM

## 2018-07-25 DIAGNOSIS — D473 Essential (hemorrhagic) thrombocythemia: Secondary | ICD-10-CM

## 2018-07-25 DIAGNOSIS — N179 Acute kidney failure, unspecified: Secondary | ICD-10-CM

## 2018-07-25 DIAGNOSIS — E872 Acidosis: Secondary | ICD-10-CM

## 2018-07-25 DIAGNOSIS — G9341 Metabolic encephalopathy: Secondary | ICD-10-CM

## 2018-07-25 DIAGNOSIS — J9600 Acute respiratory failure, unspecified whether with hypoxia or hypercapnia: Secondary | ICD-10-CM

## 2018-07-25 DIAGNOSIS — I10 Essential (primary) hypertension: Secondary | ICD-10-CM

## 2018-07-25 DIAGNOSIS — E1165 Type 2 diabetes mellitus with hyperglycemia: Secondary | ICD-10-CM

## 2018-07-25 DIAGNOSIS — C772 Secondary and unspecified malignant neoplasm of intra-abdominal lymph nodes: Secondary | ICD-10-CM

## 2018-07-25 DIAGNOSIS — C186 Malignant neoplasm of descending colon: Secondary | ICD-10-CM

## 2018-07-25 LAB — CBC
HCT: 30.3 % — ABNORMAL LOW (ref 39.0–52.0)
Hemoglobin: 8.9 g/dL — ABNORMAL LOW (ref 13.0–17.0)
MCH: 27.8 pg (ref 26.0–34.0)
MCHC: 29.4 g/dL — ABNORMAL LOW (ref 30.0–36.0)
MCV: 94.7 fL (ref 80.0–100.0)
NRBC: 0 % (ref 0.0–0.2)
PLATELETS: 551 10*3/uL — AB (ref 150–400)
RBC: 3.2 MIL/uL — AB (ref 4.22–5.81)
RDW: 15.3 % (ref 11.5–15.5)
WBC: 18.6 10*3/uL — ABNORMAL HIGH (ref 4.0–10.5)

## 2018-07-25 LAB — GLUCOSE, CAPILLARY
GLUCOSE-CAPILLARY: 297 mg/dL — AB (ref 70–99)
Glucose-Capillary: 200 mg/dL — ABNORMAL HIGH (ref 70–99)
Glucose-Capillary: 210 mg/dL — ABNORMAL HIGH (ref 70–99)
Glucose-Capillary: 238 mg/dL — ABNORMAL HIGH (ref 70–99)
Glucose-Capillary: 239 mg/dL — ABNORMAL HIGH (ref 70–99)
Glucose-Capillary: 269 mg/dL — ABNORMAL HIGH (ref 70–99)

## 2018-07-25 LAB — BASIC METABOLIC PANEL
Anion gap: 10 (ref 5–15)
BUN: 99 mg/dL — AB (ref 8–23)
CHLORIDE: 125 mmol/L — AB (ref 98–111)
CO2: 12 mmol/L — AB (ref 22–32)
Calcium: 9 mg/dL (ref 8.9–10.3)
Creatinine, Ser: 4.73 mg/dL — ABNORMAL HIGH (ref 0.61–1.24)
GFR calc Af Amer: 14 mL/min — ABNORMAL LOW (ref >60)
GFR calc non Af Amer: 12 mL/min — ABNORMAL LOW (ref >60)
Glucose, Bld: 285 mg/dL — ABNORMAL HIGH (ref 70–99)
Potassium: 4.3 mmol/L (ref 3.5–5.1)
SODIUM: 147 mmol/L — AB (ref 135–145)

## 2018-07-25 LAB — COMPREHENSIVE METABOLIC PANEL WITH GFR
ALT: 105 U/L — ABNORMAL HIGH (ref 0–44)
AST: 138 U/L — ABNORMAL HIGH (ref 15–41)
Albumin: 2 g/dL — ABNORMAL LOW (ref 3.5–5.0)
Alkaline Phosphatase: 119 U/L (ref 38–126)
Anion gap: 10 (ref 5–15)
BUN: 94 mg/dL — ABNORMAL HIGH (ref 8–23)
CO2: 13 mmol/L — ABNORMAL LOW (ref 22–32)
Calcium: 9.7 mg/dL (ref 8.9–10.3)
Chloride: 128 mmol/L — ABNORMAL HIGH (ref 98–111)
Creatinine, Ser: 4.35 mg/dL — ABNORMAL HIGH (ref 0.61–1.24)
GFR calc Af Amer: 15 mL/min — ABNORMAL LOW
GFR calc non Af Amer: 13 mL/min — ABNORMAL LOW
Glucose, Bld: 235 mg/dL — ABNORMAL HIGH (ref 70–99)
Potassium: 4.2 mmol/L (ref 3.5–5.1)
Sodium: 151 mmol/L — ABNORMAL HIGH (ref 135–145)
Total Bilirubin: 1 mg/dL (ref 0.3–1.2)
Total Protein: 8.9 g/dL — ABNORMAL HIGH (ref 6.5–8.1)

## 2018-07-25 LAB — POCT I-STAT 3, ART BLOOD GAS (G3+)
Acid-base deficit: 13 mmol/L — ABNORMAL HIGH (ref 0.0–2.0)
BICARBONATE: 14 mmol/L — AB (ref 20.0–28.0)
O2 SAT: 100 %
PCO2 ART: 38.2 mmHg (ref 32.0–48.0)
PO2 ART: 292 mmHg — AB (ref 83.0–108.0)
Patient temperature: 101.6
TCO2: 15 mmol/L — AB (ref 22–32)
pH, Arterial: 7.182 — CL (ref 7.350–7.450)

## 2018-07-25 LAB — RPR: RPR Ser Ql: NONREACTIVE

## 2018-07-25 LAB — BLOOD GAS, ARTERIAL
Acid-base deficit: 15.2 mmol/L — ABNORMAL HIGH (ref 0.0–2.0)
BICARBONATE: 9.6 mmol/L — AB (ref 20.0–28.0)
DRAWN BY: 270221
O2 CONTENT: 2 L/min
O2 Saturation: 97.1 %
PATIENT TEMPERATURE: 98.8
PO2 ART: 97 mmHg (ref 83.0–108.0)
pH, Arterial: 7.339 — ABNORMAL LOW (ref 7.350–7.450)

## 2018-07-25 LAB — FOLATE: Folate: 17.1 ng/mL

## 2018-07-25 LAB — BASIC METABOLIC PANEL WITH GFR
Anion gap: 11 (ref 5–15)
BUN: 93 mg/dL — ABNORMAL HIGH (ref 8–23)
CO2: 13 mmol/L — ABNORMAL LOW (ref 22–32)
Calcium: 9.7 mg/dL (ref 8.9–10.3)
Chloride: 127 mmol/L — ABNORMAL HIGH (ref 98–111)
Creatinine, Ser: 4.27 mg/dL — ABNORMAL HIGH (ref 0.61–1.24)
GFR calc Af Amer: 16 mL/min — ABNORMAL LOW
GFR calc non Af Amer: 14 mL/min — ABNORMAL LOW
Glucose, Bld: 228 mg/dL — ABNORMAL HIGH (ref 70–99)
Potassium: 4.1 mmol/L (ref 3.5–5.1)
Sodium: 151 mmol/L — ABNORMAL HIGH (ref 135–145)

## 2018-07-25 LAB — PHOSPHORUS: Phosphorus: 3.5 mg/dL (ref 2.5–4.6)

## 2018-07-25 LAB — PROCALCITONIN: PROCALCITONIN: 1.62 ng/mL

## 2018-07-25 LAB — AMMONIA: Ammonia: 23 umol/L (ref 9–35)

## 2018-07-25 LAB — MAGNESIUM: MAGNESIUM: 2 mg/dL (ref 1.7–2.4)

## 2018-07-25 LAB — VITAMIN B12: Vitamin B-12: 405 pg/mL (ref 180–914)

## 2018-07-25 MED ORDER — SODIUM CHLORIDE 0.9 % IV BOLUS
500.0000 mL | Freq: Once | INTRAVENOUS | Status: AC
  Administered 2018-07-25: 500 mL via INTRAVENOUS

## 2018-07-25 MED ORDER — CHLORHEXIDINE GLUCONATE 0.12% ORAL RINSE (MEDLINE KIT)
15.0000 mL | Freq: Two times a day (BID) | OROMUCOSAL | Status: DC
  Administered 2018-07-25 – 2018-08-04 (×17): 15 mL via OROMUCOSAL

## 2018-07-25 MED ORDER — PRO-STAT SUGAR FREE PO LIQD
30.0000 mL | Freq: Two times a day (BID) | ORAL | Status: DC
  Filled 2018-07-25: qty 30

## 2018-07-25 MED ORDER — FENTANYL 2500MCG IN NS 250ML (10MCG/ML) PREMIX INFUSION
25.0000 ug/h | INTRAVENOUS | Status: DC
  Administered 2018-07-25: 50 ug/h via INTRAVENOUS
  Administered 2018-07-26: 200 ug/h via INTRAVENOUS
  Filled 2018-07-25 (×2): qty 250

## 2018-07-25 MED ORDER — FENTANYL CITRATE (PF) 100 MCG/2ML IJ SOLN
INTRAMUSCULAR | Status: AC
  Administered 2018-07-25: 19:00:00
  Filled 2018-07-25: qty 2

## 2018-07-25 MED ORDER — MIDAZOLAM HCL 2 MG/2ML IJ SOLN: 2.0000 mg | Freq: Once | INTRAMUSCULAR | Status: DC

## 2018-07-25 MED ORDER — FENTANYL BOLUS VIA INFUSION
50.0000 ug | INTRAVENOUS | Status: DC | PRN
  Administered 2018-07-25 – 2018-07-29 (×5): 50 ug via INTRAVENOUS
  Filled 2018-07-25: qty 50

## 2018-07-25 MED ORDER — ETOMIDATE 2 MG/ML IV SOLN
0.3000 mg/kg | Freq: Once | INTRAVENOUS | Status: AC
  Administered 2018-07-25: 20 mg via INTRAVENOUS

## 2018-07-25 MED ORDER — MIDAZOLAM HCL 2 MG/2ML IJ SOLN
1.0000 mg | INTRAMUSCULAR | Status: DC | PRN
  Administered 2018-07-25 – 2018-07-26 (×2): 2 mg via INTRAVENOUS
  Administered 2018-07-26 (×2): 1 mg via INTRAVENOUS
  Filled 2018-07-25 (×5): qty 2

## 2018-07-25 MED ORDER — INSULIN ASPART 100 UNIT/ML ~~LOC~~ SOLN
1.0000 [IU] | SUBCUTANEOUS | Status: DC
  Administered 2018-07-25 – 2018-07-26 (×3): 3 [IU] via SUBCUTANEOUS

## 2018-07-25 MED ORDER — ORAL CARE MOUTH RINSE
15.0000 mL | OROMUCOSAL | Status: DC
  Administered 2018-07-25 – 2018-07-29 (×36): 15 mL via OROMUCOSAL

## 2018-07-25 MED ORDER — FENTANYL CITRATE (PF) 100 MCG/2ML IJ SOLN: 100.0000 ug | Freq: Once | INTRAMUSCULAR | Status: DC

## 2018-07-25 MED ORDER — TRAVASOL 10 % IV SOLN
INTRAVENOUS | Status: AC
  Administered 2018-07-25: 17:00:00 via INTRAVENOUS
  Filled 2018-07-25: qty 1344

## 2018-07-25 MED ORDER — POTASSIUM CHLORIDE 10 MEQ/100ML IV SOLN
INTRAVENOUS | Status: AC
  Filled 2018-07-25: qty 100

## 2018-07-25 MED ORDER — SODIUM BICARBONATE 8.4 % IV SOLN
INTRAVENOUS | Status: DC
  Administered 2018-07-25 – 2018-07-27 (×4): via INTRAVENOUS
  Filled 2018-07-25 (×7): qty 150

## 2018-07-25 MED ORDER — FREE WATER
200.0000 mL | Freq: Three times a day (TID) | Status: DC
  Administered 2018-07-25 – 2018-07-26 (×2): 200 mL

## 2018-07-25 MED ORDER — FENTANYL CITRATE (PF) 100 MCG/2ML IJ SOLN: 50.0000 ug | Freq: Once | INTRAMUSCULAR | Status: DC

## 2018-07-25 MED ORDER — SODIUM BICARBONATE 8.4 % IV SOLN: 100.0000 meq | Freq: Once | INTRAVENOUS | Status: DC

## 2018-07-25 MED ORDER — STERILE WATER FOR INJECTION IV SOLN
INTRAVENOUS | Status: DC
  Administered 2018-07-25: 09:00:00 via INTRAVENOUS
  Filled 2018-07-25 (×3): qty 9.71

## 2018-07-25 MED ORDER — INSULIN GLARGINE 100 UNIT/ML ~~LOC~~ SOLN
40.0000 [IU] | Freq: Every day | SUBCUTANEOUS | Status: DC
  Administered 2018-07-26: 40 [IU] via SUBCUTANEOUS
  Filled 2018-07-25 (×2): qty 0.4

## 2018-07-25 MED ORDER — ACETAMINOPHEN 160 MG/5ML PO SOLN
650.0000 mg | Freq: Four times a day (QID) | ORAL | Status: DC | PRN
  Administered 2018-07-25 – 2018-07-29 (×8): 650 mg
  Filled 2018-07-25 (×8): qty 20.3

## 2018-07-25 MED ORDER — MIDAZOLAM HCL 2 MG/2ML IJ SOLN
INTRAMUSCULAR | Status: AC
  Administered 2018-07-25: 19:00:00
  Filled 2018-07-25: qty 2

## 2018-07-25 MED ORDER — ROCURONIUM BROMIDE 50 MG/5ML IV SOLN
1.0000 mg/kg | Freq: Once | INTRAVENOUS | Status: AC
  Administered 2018-07-25: 70 mg via INTRAVENOUS

## 2018-07-25 NOTE — Progress Notes (Addendum)
PHARMACY - ADULT TOTAL PARENTERAL NUTRITION CONSULT NOTE   Pharmacy Consult for TPN Indication: prolonged ileus  Patient Measurements: Height: 6' (182.9 cm) Weight: (!) 316 lb (143.3 kg) IBW/kg (Calculated) : 77.6 TPN AdjBW (KG): 86.1 Body mass index is 42.86 kg/m.  Assessment:  22 yom transferred from OSH with abdominal pain, N/V and found to have an acute perforated/obstructing large carcinoma in the colon with multiple metastases. S/p OR 10/9 for exlap and liver biopsy (positive) with colectomy. Attempted TF but with almost immediate vomiting. Started TPN for nutrition support with post-op ileus. Pt is at risk for refeeding.   GI: Pre-albumin up to WNL 26.8. S/p colectomy with going ileus - 29mL drain OP, 49mL NG OP. LBM 10/21 - 10/18: Perisplenic abscess drain, Hepatic abscess drain. Famotidine in TPN  Endo: Hx DM (Amaryl, metformin PTA, Not on insulin PTA) - CBGs uncontrolled 200-235.  Insulin requirements last 24 hrs: 43 units rSSI + 125 units in TPN + Lantus 35 units daily Lytes: K down to 4.3, Na stable 151, corr Ca~11.2, Cl up to 128, CO2 down to 13, others WNL. *No lytes in TPN Renal: AKI - SCr now trend back up to 4.35 (baseline~1), BUN up to 94, UOP 0.6 cc/kg/hr. Bicarb drip started 10/23 Pulm: extubated 10/13 to RA; PRN albuterol Cards: h/o HTN:  BP mostly ok, tachy to 130s Hepatobil: LFTs moderately elevated (AST 138 / ALT 105), tbili down to WNL, TG up to 316 Neuro: Transient confusion. APAP / morph prn ID: Zosyn postop for gross contamination with stool perforated colon, now Dapto / Eraxis added - Tmax/24h 100.5, WBC stable 18.6. BCx/liver abscess cx - NGTD  TPN Access: CVC TPN start date: 07/16/18 Nutritional Goals (per RD rec on 10/14): KCal: 2400-2600 KCal Protein: 120-135 g  Fluid: >/= 2L/day  Goal TPN rate is 100 ml/hr (provides 132g protein and 2472 KCal - 100% goal)  Current Nutrition:  Clear liquid TPN  Plan:  Continue TPN at 100 mL/hr - This TPN  provides 134 g of protein, 336 g of dextrose (reduced 10/23), and 72 g of lipids which provides 2400 kCals per day, meeting 100% of patient needs  Electrolytes in TPN: Continue all lytes removed for now; Cl:Ac at max acetate Add daily MVI, trace elements, Pepcid 20 mg to TPN  Reduce dextrose in TPN 10/23 (360>336g) + Continue rSSI + Lantus 35 units daily + increase insulin in TPN bag to 150 units - monitor CBGs and adjust as needed *Patient almost at max insulin in TPN (~65 units/L) - any further adjustments 10/24 need to be outside of TPN bag  Continue sodium bicarb infusion at 75 ml/hr per MD Monitor TPN labs, triglyceride trend  Elicia Lamp, PharmD, BCPS Clinical Pharmacist Clinical phone [phone] Please check AMION for all [place] contact numbers 07/25/2018 9:05 AM

## 2018-07-25 NOTE — Progress Notes (Signed)
PULMONARY / CRITICAL CARE MEDICINE   NAME:  Jerry Green, MRN:  [id number], DOB:  09-24-1956, LOS: 19 ADMISSION DATE:  07/26/2018, CONSULTATION DATE:  07/09/2018 REFERRING MD:  Jeanmarie Hubert, CHIEF COMPLAINT:  Abdominal pain  BRIEF HISTORY:    62 year old diabetic status post exploratory laparotomy 10/9 with finding of perforated sigmoid colon secondary to an obstructing carcinoma.  Pathology positive for invasive metastatic adenocarcinoma.  SIGNIFICANT PAST MEDICAL HISTORY   HTN, DMT2, GERD, colon pylops  SIGNIFICANT EVENTS:  10/8 transferred from OSH 10/9 ex lap with left colectomy, liver biopsy 10/13 extubated 10/14 transfer out of ICU to floor 10/18 IR abd drain placement 10/23 transfer back to ICU for intubation  STUDIES:   PTA OHS CT Abd >>perforated obstructing large carcinoma of the splenic flexure of the colon with lymphatic metastases, peritoneal carcinomatosis, and liver metastases CT A/P 10/17 > 3 fluid collections identified in abdomen.  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 CT head 10/19 > no acute process. MRI brain 10/22 > no acute process. CT chest 10/24 >  CT A / P 10/24 >   CULTURES:  BC at OSH  10/8 MRSA PCR >> neg  ANTIBIOTICS:  OHS zosyn and cipro Zosyn 10/8 >  Anidulafungin 10/20 >  Daptomycin 10/20 >    LINES/TUBES:  10/9 ETT >>10/13 10/9 R DL IJ CVC >> ? Removal date 10/9 Wound vac >> ? Removal date RUE PICC 10/14 >  Colostomy 10/9 >  Abdominal abscess drains 10/18 >   CONSULTANTS:  10/8>> CCS 10/9 PCCM  SUBJECTIVE:  Called back PM 10/23 due to tachypnea and AMS. Required transfer to ICU for intubation.  CONSTITUTIONAL: BP (!) 145/89   Pulse (!) 119   Temp 98.8 F (37.1 C) (Axillary)   Resp (!) 37   Ht 6' (1.829 m)   Wt (!) 143.3 kg   SpO2 100%   BMI 42.86 kg/m   I/O  last 3 completed shifts: In: 2643.8 [I.V.:2377.7; Other:20; IV Piggyback:246] Out: 8756 [EPPIR:5188; Drains:63; Stool:60]       PHYSICAL EXAM: General: Adult male, critically ill appearing, in distress. Neuro: Obtunded. HEENT: [place]/AT. Sclerae anicteric.  MM dry. Cardiovascular: RRR, no M/R/G.  Lungs: Respirations shallow and labored.  Faint basilar crackles. Abdomen: Colostomy in place.  Abdominal binder in place with abd dressings underneath that are C/D/I. Musculoskeletal: No gross deformities, 1+ edema.  Skin: Intact, warm, no rashes.   ASSESSMENT AND PLAN   62 year old diabetic status post exploratory laparotomy 10/9 with finding of perforated sigmoid colon secondary to an obstructing carcinoma.  Pathology positive for invasive metastatic adenocarcinoma. Transferred out of ICU 10/15, then PCCM called back PM 10/23 for AMS and tachypnea.  Required transfer to ICU for intubation.   Respiratory insufficiency - Currently in distress and unable to compensate appropriately. - Transfer to ICU for intubation.  Sepsis - due to abdominal abscesses +/- PNA. - Continue empiric abx (anidulafungin, daptomycin, zosyn). - Follow cultures. - Check PCT again. - Repeat CT chest and abd / pelv planned 10/24.  Bowel obstruction with perforation - s/p ex lap 10/9. - Post op care per CCS.  Post operative abdominal abscess - s/p IR drain placement 10/18. - Continue drains. - Repeat CT planned for 10/24.  Metastatic adenocarcinoma (confirmed after ex lap with biopsy of colon and liver 10/9). - If recovers, then will need outpatient oncology follow up.  AKI - worsening. NAGMA. Hyperchloremia. - Nephrology following. - If worsens then unsure if would be an HD / CRRT candidate. - Continue bicarb gtt. - Follow BMP.  Hypernatremia - ~6.7L FWD. - Start free water per tube.  Metabolic encephalopathy - worse and  now exacerbated by uremia. - Continue supportive care. - If no improvement,  then might need HD / CRRT if deemed OK by nephrology.  Malnutrition. - Continue TNA.  DM. - SSI.  Anemia of chronic disease. - Transfuse for Hgb < 7.   SUMMARY OF TODAY'S PLAN:  Transfer to ICU for intubation. Continue bicarb gtt, abx. Repeat CT chest and abd / pelv 10/24.  Best Practice / Goals of Care / Disposition.   DVT PROPHYLAXIS: Heparin. SUP: Pepcid. MOBILITY: Bedrest. CODE STATUS:  Full.  Needs goals of care discussions.  Daughter spoke with Dr. Chase Caller 10/23 and requested full code.  LABS  Glucose Recent Labs  Lab 07/24/18 1713 07/24/18 2011 07/24/18 2346 07/25/18 0404 07/25/18 0722 07/25/18 1222  GLUCAP 255* 247* 216* 200* 210* 269*    BMET Recent Labs  Lab 07/23/18 0346 07/24/18 0420 07/25/18 0500  NA 153* 151* 151*  151*  K 4.5 4.3 4.2  4.1  CL 129* 124* 128*  127*  CO2 18* 16* 13*  13*  BUN 88* 84* 94*  93*  CREATININE 3.88* 3.80* 4.35*  4.27*  GLUCOSE 267* 249* 235*  228*    Liver Enzymes Recent Labs  Lab 07/21/18 0035 07/23/18 0346 07/25/18 0500  AST 148* 94* 138*  ALT 137* 114* 105*  ALKPHOS 128* 125 119  BILITOT 1.4* 1.0 1.0  ALBUMIN 2.4* 2.1* 2.0*    Electrolytes Recent Labs  Lab 07/23/18 0346 07/24/18 0420 07/25/18 0500  CALCIUM 9.1 9.5 9.7  9.7  MG 2.1 2.0 2.0  PHOS 3.6 4.2 3.5    CBC Recent Labs  Lab 07/23/18 0346 07/24/18 0850 07/25/18 0500  WBC 20.3* 18.3* 18.6*  HGB 8.9* 8.8* 8.9*  HCT 29.5* 28.7* 30.3*  PLT 599* 552* 551*    ABG Recent Labs  Lab 07/25/18 1411  PHART 7.339*  PCO2ART BELOW REPORTABLE RANGE  PO2ART 97.0    Coag's Recent Labs  Lab 07/21/18 0352  INR 1.50    Sepsis Markers No results for input(s): LATICACIDVEN, PROCALCITON, O2SATVEN in the last 168 hours.  Cardiac Enzymes No results for input(s): TROPONINI, PROBNP in the last 168 hours.   CC time: 45 min.   Montey Hora, [facility] Pager: [phone]  or [phone] 07/25/2018, 6:54 PM

## 2018-07-25 NOTE — Progress Notes (Signed)
Nutrition Follow-up  DOCUMENTATION CODES:   Obesity unspecified  INTERVENTION:    TPN @ 100 ml/hr to provide 2472 kcal and 132 grams protein- monitor CBGs  RD to add 30 ml Prostat BID, each supplement provides 100 kcals and 15 grams protein  Monitor for diet advancement  NUTRITION DIAGNOSIS:   Inadequate oral intake related to poor appetite, nausea, vomiting as evidenced by per patient/family report.  Ongoing   GOAL:   Patient will meet greater than or equal to 90% of their needs  Meeting with TPN  MONITOR:   Diet advancement, I & O's, Labs, Skin  REASON FOR ASSESSMENT:   Consult, Ventilator Enteral/tube feeding initiation and management  ASSESSMENT:   62 year old male who presented with abdominal pain. PMH significant for hypertension, type 2 diabetes mellitus, GERD. 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   10/9 - s/p ex lap, liver biopsy, colectomy with open abdomen and wound VAC 10/11 - start trickle feeds Vital HP @ 20 ml/hr, pt vomited, TF stopped 10/13 - extubated, TPN started 10/16 - TPN increased to 65 ml/hr 10/17 - TPN increased to goal rate of 100 ml/hr, NGT pulled out and replaced 10/18- NGT pulled out 10/22- diet advanced to clears   Pt being fed jello by tech during RD visit. Pt still seems somewhat confused but denies nausea/vomiting. Tech reports toleration of both broth and jello this morning. TPN continues at 100 ml/hr providing 2472 kcal and 132 grams of protein. Meets 100% of needs. Pt continues to have issues with blood sugars. Insulin almost at max in TPN formula (65 units). Will continue to monitor.   RD to add Prostat while pt on clears to provide protein. Once diet is advanced to fulls will provide pt with high kcal high protein supplement in efforts to wean TPN.  Per surgery, CXR shows  possible PNA. Pt remains febrile.   Weight has shown to increase from 250 lb on 10/17 to 316 lb today. Unsure if this is scale error.   I/O: -6.4 L since admit UOP; 1975 ml x 24 hrs LLQ drain: 30 ml x 24 hours- dark brown output  Medications reviewed and include: SSI, Lantus, sodium bicarb with 0.225% NaCl @ 75 ml/hr Labs reviewed: Na 151 (H) Cl 128 (H) CBG 200-255 (H) TGs on 10/21 316 (H)   Diet Order:   Diet Order            Diet clear liquid Room service appropriate? Yes; Fluid consistency: Thin  Diet effective now              EDUCATION NEEDS:   No education needs have been identified at this time  Skin:  Skin Assessment: Skin Integrity Issues: Skin Integrity Issues:: Wound VAC Wound Vac: open abdomen  Last BM:  10/21- 50 ml colostomy  Height:   Ht Readings from Last 1 Encounters:  07/18/18 6' (1.829 m)    Weight:   Wt Readings from Last 1 Encounters:  07/25/18 (!) 143.3 kg    Ideal Body Weight:  80.91 kg  BMI:  Body mass index is 42.86 kg/m.  Estimated Nutritional Needs:   Kcal:  2400-2600 kcal   Protein:  120-135 grams  Fluid:  >/= 2 L/day  Mariana Single RD, LDN Clinical Nutrition Pager # - [phone]

## 2018-07-25 NOTE — Progress Notes (Signed)
PROGRESS NOTE                                                                                                                                                                                                             Patient Demographics:    Jerry Green, is a 62 y.o. male, DOB - 04-Jun-1956, FEO:[id number]  Admit date - 07/24/2018   Admitting Physician Norval Morton, MD  Outpatient Primary MD for the patient is Pallone, Jarvis Newcomer, MD  LOS - 15   No chief complaint on file.      Brief Narrative    62 yo M with hx of HTN and DM2 who was transferred from an OSH with 2-3 days of abd pain w/ N/V. He was found to have an acuteperforated obstructing large carcinoma of the splenic flexure of the colon with suspectedlymphatic metastases, peritoneal carcinomatosis, liver metastases, and invasion of the left kidney. He was taken to the OR 10/9 for ex lap and liver biopsy s/p colectomy and open abd with wound vac.  Total course significant for worsening renal function, and worsening encephalopathy as well.   Subjective:    Jerry Green is altered, cannot provide any complaints, remains encephalopathic, tachypneic overnight.  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 - S/PExploratory laparotomy, left colectomy and liver biopsy, 07/26/2018, Dr. Rolm Bookbinder -Postoperative abdominal wound dehiscence, with no evisceration, . -Abscess, status post IR  drain placement x2 07/20/2018  -Means with fever, leukocytosis, cultures so far with no growth to date . -Is on Cubicin, and lymphangina and Zosyn, I have consulted ID to evaluate if possible to narrow his antibiotic regimen . -Repeat CT chest/abdomen/pelvis to look further for source of infection, improvement of abscess, or developing pneumonia -Discussed with Dr. Frutoso Chase, he will have oncology department evaluate  Metabolic encephalopathy - Noncontrast head  CT on 07/20/2018 unremarkable -MRI head with no evidence of metastasis, vitamin B 12, ammonia, folate within normal level . -Multifactorial in the setting of infectious process, and likely uremia worsening renal function .  Acute Respiratory Failure  - in post operative setting, wearing 2 L nasal cannula today, will obtain CT chest -With significant tachypnea and CO2 significant metabolic acidosis and compensatory respiratory alkalosis.  Persistent fever - intra-abdominal fluid collections S/p percutaneous drain placement x2 10/18 per IR - remains on very broad antimicrobial coverage - continues to have intermittent significant fevers -see above discussion  Hypernatremia Pharmacy adjusting TNA - follow trend   Nutrition  TNA dependent at present - diet per [facility] - to try clear liquids today   Acute kidney injury  Baseline crt appears to have been ~1.0 -4.3 today, with bicarb of 13, renal consult greatly appreciated,  -continue with bicarb drip  HTN BP reasonably controlled for this clinical situation - follow w/o change today  DM2 CBG not controlled - pharmacy to adjust insulin in TNA   Code Status : Full  Family Communication  : None at bedside, left his daughter to voicemail  Disposition Plan  : pending further work up. Not ready for discharge  Consults  :  ID,oncology, surgery  Procedures  : S/PExploratory laparotomy, left colectomy and liver biopsy, 07/15/2018, Dr. Rolm Bookbinder ,IR abdominal drain placement  x 2 - 07/20/18   DVT Prophylaxis  :  Yell heaprin  Lab Results  Component Value Date   PLT 551 (H) 07/25/2018    Antibiotics  :    Anti-infectives (From admission, onward)   Start     Dose/Rate Route Frequency Ordered Stop   07/23/18 1500  anidulafungin (ERAXIS) 100 mg in sodium chloride 0.9 % 100 mL IVPB     100 mg 78 mL/hr over 100 Minutes Intravenous Every 24 hours 07/22/18 1420     07/22/18 2000  DAPTOmycin (CUBICIN) 850 mg in sodium chloride 0.9 % IVPB     850 mg 234 mL/hr over 30 Minutes Intravenous Every 48 hours 07/22/18 1459     07/22/18 1530  anidulafungin (ERAXIS) 200 mg in sodium chloride 0.9 % 200 mL IVPB     200 mg 78 mL/hr over 200 Minutes Intravenous  Once 07/22/18 1420 07/22/18 1941   07/21/2018 1800  piperacillin-tazobactam (ZOSYN) IVPB 3.375 g     3.375 g 12.5 mL/hr over 240 Minutes Intravenous Every 8 hours 07/08/2018 1444     07/12/2018 0915  cefoTEtan (CEFOTAN) 2 g in sodium chloride 0.9 % 100 mL IVPB     2 g 200 mL/hr over 30 Minutes Intravenous To Short Stay 07/30/2018 0857 07/08/2018 1900   07/20/2018 0906  cefoTEtan in Dextrose 5% (CEFOTAN) 2-2.08 GM-%(50ML) IVPB    Note to Pharmacy:  [name]   : cabinet override      07/17/2018 0906 07/06/2018 2114   07/28/2018 1200  piperacillin-tazobactam (ZOSYN) IVPB 3.375 g  Status:  Discontinued     3.375 g 12.5 mL/hr over 240 Minutes Intravenous Every 8 hours 07/09/2018 0615 07/22/2018 1341   07/16/2018 0730  cefoTEtan (CEFOTAN) 2 g in sodium chloride 0.9 % 100 mL IVPB  Status:  Discontinued     2 g 200 mL/hr over 30 Minutes Intravenous On call to O.R. 07/13/2018 0723 07/05/2018 0559   07/13/2018 0615  piperacillin-tazobactam (ZOSYN) IVPB 3.375 g     3.375 g 100 mL/hr over 30 Minutes Intravenous  Once 07/25/2018 0612 07/08/2018 1943  Objective:   Vitals:   07/25/18 0500 07/25/18 0800 07/25/18 1153 07/25/18 1200  BP: 139/87 138/87  (!) 145/89  Pulse: (!) 116  (!) 128   Resp: (!) 41  (!) 44   Temp:  (!) 100.5 F (38.1 C) 98.8  F (37.1 C)   TempSrc:  Oral    SpO2: 100%  (!) 87%   Weight: (!) 143.3 kg     Height:        Wt Readings from Last 3 Encounters:  07/25/18 (!) 143.3 kg     Intake/Output Summary (Last 24 hours) at 07/25/2018 1503 Last data filed at 07/25/2018 1100 Gross per 24 hour  Intake 1040 ml  Output 1185 ml  Net -145 ml     Physical Exam  Patient is a pleasant, confused, lethargic but open eyes to stimuli, does not follow commands, noncommunicative Symmetrical Chest wall movement, tachypneic, no wheezing, diminished air entry at the bases . Tachycardic,No Gallops,Rubs or new Murmurs, No Parasternal Heave +ve B.Sounds, abdominal binder, midline wound with packing and dehiscence, right colostomy, drain present . No Cyanosis, Clubbing or edema, No new Rash or bruise     Data Review:    CBC Recent Labs  Lab 07/21/18 0035 07/21/18 0352 07/23/18 0346 07/24/18 0850 07/25/18 0500  WBC 23.3* 22.9* 20.3* 18.3* 18.6*  HGB 9.8* 9.7* 8.9* 8.8* 8.9*  HCT 32.2* 31.1* 29.5* 28.7* 30.3*  PLT 579* 597* 599* 552* 551*  MCV 92.0 92.0 93.4 93.5 94.7  MCH 28.0 28.7 28.2 28.7 27.8  MCHC 30.4 31.2 30.2 30.7 29.4*  RDW 14.8 14.8 14.9 15.2 15.3  LYMPHSABS  --   --  2.5  --   --   MONOABS  --   --  1.2*  --   --   EOSABS  --   --  0.7*  --   --   BASOSABS  --   --  0.1  --   --     Chemistries  Recent Labs  Lab 07/19/18 0009  07/20/18 0342 07/21/18 0035 07/21/18 0352 07/22/18 0409 07/23/18 0346 07/24/18 0420 07/25/18 0500  NA 152*  --  152* 151* 153* 156* 153* 151* 151*  151*  K 3.8  --  4.3 4.5 4.5 5.0 4.5 4.3 4.2  4.1  CL 115*  --  117* 121* 119* 126* 129* 124* 128*  127*  CO2 22  --  20* 20* 19* 19* 18* 16* 13*  13*  GLUCOSE 243*  --  273* 305* 326* 280* 267* 249* 235*  228*  BUN 74*  --  85* 95* 98* 99* 88* 84* 94*  93*  CREATININE 3.41*  --  3.66* 4.33* 4.19* 4.12* 3.88* 3.80* 4.35*  4.27*  CALCIUM 9.3  --  9.5 9.0 9.2 9.1 9.1 9.5 9.7  9.7  MG  --    < > 2.6*  --    --  2.3 2.1 2.0 2.0  AST 137*  --   --  148*  --   --  94*  --  138*  ALT 94*  --   --  137*  --   --  114*  --  105*  ALKPHOS 119  --   --  128*  --   --  125  --  119  BILITOT 1.9*  --   --  1.4*  --   --  1.0  --  1.0   < > = values in this interval not displayed.   ------------------------------------------------------------------------------------------------------------------  Recent Labs    07/23/18 0346  TRIG 316*    Lab Results  Component Value Date   HGBA1C 7.4 (H) 07/14/2018   ------------------------------------------------------------------------------------------------------------------ No results for input(s): TSH, T4TOTAL, T3FREE, THYROIDAB in the last 72 hours.  Invalid input(s): FREET3 ------------------------------------------------------------------------------------------------------------------ Recent Labs    07/25/18 0500  VITAMINB12 405  FOLATE 17.1    Coagulation profile Recent Labs  Lab 07/21/18 0352  INR 1.50    No results for input(s): DDIMER in the last 72 hours.  Cardiac Enzymes No results for input(s): CKMB, TROPONINI, MYOGLOBIN in the last 168 hours.  Invalid input(s): CK ------------------------------------------------------------------------------------------------------------------ No results found for: BNP  Inpatient Medications  Scheduled Meds: . chlorhexidine  15 mL Mouth Rinse BID  . feeding supplement (PRO-STAT SUGAR FREE 64)  30 mL Oral BID  . heparin injection (subcutaneous)  5,000 Units Subcutaneous Q8H  . insulin aspart  0-20 Units Subcutaneous Q4H  . [START ON 07/26/2018] insulin glargine  40 Units Subcutaneous Daily  . mouth rinse  15 mL Mouth Rinse q12n4p  . sodium chloride flush  5 mL Intracatheter Q8H   Continuous Infusions: . anidulafungin 100 mg (07/24/18 1624)  . DAPTOmycin (CUBICIN)  IV 850 mg (07/24/18 2056)  . piperacillin-tazobactam (ZOSYN)  IV 3.375 g (07/25/18 0847)  . potassium chloride    .  potassium chloride    .  sodium bicarbonate infusion 1/4 NS 1000 mL 75 mL/hr at 07/25/18 0844  . TPN ADULT (ION) 100 mL/hr at 07/24/18 1752  . TPN ADULT (ION)     PRN Meds:.acetaminophen (TYLENOL) oral liquid 160 mg/5 mL, acetaminophen, albuterol, morphine injection, sodium chloride flush  Micro Results Recent Results (from the past 240 hour(s))  Aerobic/Anaerobic Culture (surgical/deep wound)     Status: None (Preliminary result)   Collection Time: 07/20/18  5:43 PM  Result Value Ref Range Status   Specimen Description LIVER  Final   Special Requests NONE  Final   Gram Stain   Final    RARE WBC PRESENT, PREDOMINANTLY PMN NO ORGANISMS SEEN    Culture   Final    NO GROWTH 5 DAYS NO ANAEROBES ISOLATED; CULTURE IN PROGRESS FOR 5 DAYS Performed at [facility], [place] [address], [place], [place] [address]    Report Status PENDING  Incomplete  Culture, blood (routine x 2)     Status: None (Preliminary result)   Collection Time: 07/21/18  8:43 AM  Result Value Ref Range Status   Specimen Description BLOOD LEFT ANTECUBITAL  Final   Special Requests   Final    BOTTLES DRAWN AEROBIC AND ANAEROBIC Blood Culture results may not be optimal due to an excessive volume of blood received in culture bottles   Culture   Final    NO GROWTH 4 DAYS Performed at [facility], [place] [address], [place], [place] [address]    Report Status PENDING  Incomplete  Culture, blood (routine x 2)     Status: None (Preliminary result)   Collection Time: 07/21/18  8:49 AM  Result Value Ref Range Status   Specimen Description BLOOD LEFT HAND  Final   Special Requests   Final    BOTTLES DRAWN AEROBIC ONLY Blood Culture adequate volume   Culture   Final    NO GROWTH 4 DAYS Performed at [facility], [place] [address], [place], [place] [address]    Report Status PENDING  Incomplete    Radiology Reports Ct Abdomen Pelvis Wo Contrast  Result Date:  07/19/2018 CLINICAL DATA:  Abdominal pain.   Evaluate for abscess. EXAM: CT ABDOMEN AND PELVIS WITHOUT CONTRAST TECHNIQUE: Multidetector CT imaging of the abdomen and pelvis was performed following the standard protocol without IV contrast. COMPARISON:  None. FINDINGS: Lower chest: There is a small to moderate left pleural effusion with left lower lobe airspace consolidation. Subsegmental atelectasis noted within the right lower lobe. Posterior left upper lobe pulmonary nodule measures 9 mm, image 3/4. Hepatobiliary: Gas, fluid and debris filled structure along the dome of the liver measures 5.2 by 3.0 by 3.0 cm. I cannot tell if this is intraparenchymal, subcapsular or between the dome of liver and right hemidiaphragm. Within the limitations of unenhanced technique no additional focal liver abnormalities identified. The gallbladder appears normal. No biliary dilatation. Pancreas: Unremarkable. No pancreatic ductal dilatation or surrounding inflammatory changes. Spleen: Normal in size without focal abnormality. Adrenals/Urinary Tract: Normal adrenal glands. Unremarkable appearance of both kidneys. The urinary bladder appears normal. Stomach/Bowel: Nasogastric tube tip is in the body of stomach. The small bowel loops have a normal caliber. A right lower quadrant colostomy is identified. Long segment Hartmann's pouch is identified which begins at the level of the distal descending colon, image 55/3. Vascular/Lymphatic: Mild aortic atherosclerosis. No aneurysm. Mild retroperitoneal adenopathy identified. No enlarged pelvic lymph nodes. 1 cm left retroperitoneal lymph node is identified, image 42/3. Adjacent lymph node measures 1.1 cm, image 44/3. Reproductive: Prostate is unremarkable. Other: A surgical drainage catheter enters the left abdomen and terminates along the left pericolic gutter, image 56/2. There is a fluid collection within the left upper quadrant of the abdomen between the spleen and left hemidiaphragm which measures 12.4 cm in maximum dimension,  image 75/6. The drainage catheter does not appear to access this fluid collection. a second possible fluid collection is identified within the pelvis between the anterior wall of rectum and posterior wall of bladder. This measures 5.6 x 3.1 by 5.5 cm. Scattered nodular densities are identified within the peritoneal cavity. The largest is in the left upper quadrant of the abdomen measuring 1.2 cm, image 50/6. Along the right lateral hemiabdomen there is a 7 mm peritoneal nodule, image 42/6. Musculoskeletal: Degenerative disc disease identified within the lower lumbar spine. IMPRESSION: 1. There are 3 fluid collections identified within the abdomen. The largest is in the left upper quadrant of the abdomen between the left hemidiaphragm and spleen. Within the pelvis there is a fluid collection between the anterior wall of rectum and posterior wall of bladder. Finally, along the dome of the right lobe of liver there is a gas, debris and fluid collection which may be within segment 7 of the liver or between the liver and right hemidiaphragm. These are all incompletely characterized without IV contrast but may represent multiple abscesses. 2. Status post right abdominal colostomy with long segment Hartmann's pouch. No abnormal bowel dilatation identified. 3. Left lower lobe airspace consolidation and small pleural effusion. Cannot rule out pneumonia. 4. Mild retroperitoneal adenopathy. 5. Pulmonary nodule in the left lobe upper lobe measures 9 mm. In a patient that may be at increased risk for lung metastasis consider further evaluation with nonemergent contrast enhanced CT of the chest for staging purposes. 6. A few scattered peritoneal nodules are identified. Specificity somewhat diminished due to extensive postoperative changes within the abdomen. Clinical correlation for any surgical findings suggestive of peritoneal disease. Electronically Signed   By: Kerby Moors M.D.   On: 07/19/2018 16:50   Dg Chest 1  View  Result Date: 07/25/2018  CLINICAL DATA:  Tachypnea EXAM: CHEST  1 VIEW COMPARISON:  July 16, 2018 FINDINGS: Central catheter tip is in the superior vena cava. Previous right jugular catheter and nasogastric tube have been removed. No pneumothorax. There is hazy air base opacity in the left mid lower lung zones with small left pleural effusion. Right lung is clear. Heart is upper normal in size with pulmonary vascularity normal. No adenopathy. No bone lesions. There are drains in the upper abdominal regions. IMPRESSION: Hazy opacity in portions of the left mid lower lung zones, concerning for pneumonia, similar to prior study. Small left pleural effusion. Right lung clear. Stable cardiac silhouette. Central catheter tip in superior vena cava. No pneumothorax. Electronically Signed   By: Lowella Grip III M.D.   On: 07/25/2018 08:11   Dg Chest 2 View  Result Date: 08/02/2018 CLINICAL DATA:  History of colon cancer abdominal pain EXAM: CHEST - 2 VIEW COMPARISON:  None. FINDINGS: Cardiac shadow is within normal limits. The lungs are well aerated bilaterally. A nodular appearing density is noted over the left mid to lower lung not well appreciated on the lateral projection. This may be related to nipple shadow. Repeat frontal imaging with nipple markers is recommended. No other focal abnormality is noted. IMPRESSION: Question left nipple shadow.  Repeat imaging is recommended. Electronically Signed   By: Inez Catalina M.D.   On: 07/08/2018 10:00   Dg Abd 1 View  Result Date: 07/15/2018 CLINICAL DATA:  Nasogastric tube placement. EXAM: ABDOMEN - 1 VIEW COMPARISON:  Chest radiograph 1 day prior FINDINGS: Nasogastric terminates at the body of the stomach. Probable small left pleural effusion with adjacent left lower lobe airspace disease. Gas-filled small bowel loops in the upper left abdomen. No gross free intraperitoneal air. IMPRESSION: Nasogastric tube terminating at the body of the stomach.  Electronically Signed   By: Abigail Miyamoto M.D.   On: 07/15/2018 08:18   Ct Head Wo Contrast  Result Date: 07/21/2018 CLINICAL DATA:  62 y/o M; altered encephalopathy. History of hypertension and diabetes. EXAM: CT HEAD WITHOUT CONTRAST TECHNIQUE: Contiguous axial images were obtained from the base of the skull through the vertex without intravenous contrast. COMPARISON:  None. FINDINGS: Brain: No evidence of acute infarction, hemorrhage, hydrocephalus, extra-axial collection or mass lesion/mass effect. Nonspecific white matter hypodensities are compatible with mild chronic microvascular ischemic changes and there is mild volume loss of the brain for age. Vascular: Calcific atherosclerosis of carotid siphons. No hyperdense vessel identified. Skull: Normal. Negative for fracture or focal lesion. Sinuses/Orbits: Mild diffuse paranasal sinus mucosal thickening. Normal aeration of the mastoid air cells. Orbits are unremarkable. Other: None. IMPRESSION: 1. No acute intracranial abnormality identified. 2. Mild chronic microvascular ischemic changes and mild volume loss of the brain for age. Electronically Signed   By: Kristine Garbe M.D.   On: 07/21/2018 00:37   Mr Brain Wo Contrast  Result Date: 07/24/2018 CLINICAL DATA:  62 year old male with altered mental status. EXAM: MRI HEAD WITHOUT CONTRAST TECHNIQUE: Multiplanar, multiecho pulse sequences of the brain and surrounding structures were obtained without intravenous contrast. COMPARISON:  Head CT without contrast 07/21/2018 FINDINGS: Brain: No restricted diffusion to suggest acute infarction. No midline shift, mass effect, evidence of mass lesion, ventriculomegaly, extra-axial collection or acute intracranial hemorrhage. Cervicomedullary junction and pituitary are within normal limits. Questionable brainstem and cerebellar volume loss out of proportion to cerebral hemisphere volume loss. Scattered and patchy cerebral white matter T2 and FLAIR  hyperintensity. Marginal involvement of the right lentiform nuclei. No  cortical encephalomalacia or chronic cerebral blood products identified. The other deep gray matter nuclei appear within normal limits. No brainstem or cerebellar signal abnormality identified. Vascular: Major intracranial vascular flow voids are preserved. Skull and upper cervical spine: Negative visible cervical spine. Normal bone marrow signal. Sinuses/Orbits: Normal orbits soft tissues. Trace paranasal sinus mucosal. Other: Mastoids are clear. Grossly normal visible internal auditory structures appear normal. Scalp and face soft tissues appear negative. IMPRESSION: 1.  No acute intracranial abnormality. 2. Questionable disproportionate volume loss of the brainstem and cerebellum, nonspecific. 3. Mild to moderate for age nonspecific signal changes in the cerebral white matter, most commonly due to chronic small vessel disease. Electronically Signed   By: Genevie Ann M.D.   On: 07/24/2018 12:57   Dg Chest Port 1 View  Result Date: 07/16/2018 CLINICAL DATA:  Shortness of breath. History of pleural effusion, peritoneal carcinomatosis EXAM: PORTABLE CHEST 1 VIEW COMPARISON:  Portable chest x-ray of July 14 2018 FINDINGS: The lungs are mildly hypoinflated. There is persistent increased density in the left mid and lower lung. Small left pleural effusion. The right lung is clear. The heart is normal in size. The hilar structures are prominent greatest on the left. The esophagogastric tube tip projects in the gastric cardia with the proximal port at the level of the GE junction. The right internal jugular venous catheter tip projects over the junction of the proximal and midportions of the SVC. The bony structures are unremarkable. IMPRESSION: Interval extubation of the trachea. Persistent bilateral hypoinflation. Left basilar atelectasis or pneumonia with small left pleural effusion, stable. The esophagogastric tube merits advancement by between  5 and 10 cm to assure that the proximal port is positioned below the GE junction. Electronically Signed   By: David  Martinique M.D.   On: 07/16/2018 07:32   Dg Chest Port 1 View  Result Date: 07/14/2018 CLINICAL DATA:  Respiratory failure. EXAM: PORTABLE CHEST 1 VIEW COMPARISON:  One-view chest x-ray 07/12/2018 FINDINGS: The heart size is normal. Endotracheal tube is stable position. The side port of the NG tube is in the distal esophagus. A right IJ line is stable. A left pleural effusion and basilar airspace disease is increasing. The right lung is clear. Lung volumes are low. IMPRESSION: 1. The side port of the NG tube is in the esophagus and could be advanced for more optimal positioning. 2. Support apparatus is otherwise stable. Satisfactory positioning of endotracheal tube. 3. Low lung volumes with progressive left pleural effusion and airspace disease, likely atelectasis. Infection is not excluded. Electronically Signed   By: San Morelle M.D.   On: 07/14/2018 08:26   Dg Chest Port 1 View  Result Date: 07/12/2018 CLINICAL DATA:  Endotracheal tube, recent exploratory laparotomy. EXAM: PORTABLE CHEST 1 VIEW COMPARISON:  07/31/2018. FINDINGS: Endotracheal tube terminates 4.9 cm above the carina. Nasogastric tube is followed into the stomach. Right IJ central line tip projects at the junction of the brachiocephalic veins. Heart size stable. Lungs are low in volume with minimal left basilar atelectasis. No airspace consolidation or pleural fluid. No pneumothorax. IMPRESSION: Low lung volumes with minimal left basilar atelectasis. Electronically Signed   By: Lorin Picket M.D.   On: 07/12/2018 10:48   Dg Chest Port 1 View  Result Date: 07/20/2018 CLINICAL DATA:  Intubated patient. Bowel obstruction, peritoneal carcinomatosis. EXAM: PORTABLE CHEST 1 VIEW COMPARISON:  PA and lateral chest x-ray of July 10, 2018 FINDINGS: The lungs are reasonably well inflated and clear. A left-sided nodular  density seen on  yesterday's study is not evident today. The heart and pulmonary vascularity are normal. The mediastinum is normal in width. The endotracheal tube tip lies approximately 4 cm above the carina. The esophagogastric tube tip projects in the gastric cardia with the proximal port at or just above the GE junction. The right internal jugular venous catheter tip projects at the junction of the proximal and midportions of the SVC. IMPRESSION: Intubated patient. No acute cardiopulmonary abnormality. No free subdiaphragmatic gas collections are observed. Advancement of the nasogastric tube by 5-10 cm is needed to assure that the proximal port lies below the GE junction. Electronically Signed   By: David  Martinique M.D.   On: 07/09/2018 14:16   Dg Abd Portable 1v  Addendum Date: 07/20/2018   ADDENDUM REPORT: 07/20/2018 07:34 ADDENDUM: Study discussed by telephone with Nurse Jolayne Haines on 07/20/2018 at 0731 hours. Electronically Signed   By: Genevie Ann M.D.   On: 07/20/2018 07:34   Result Date: 07/20/2018 CLINICAL DATA:  62 year old male enteric tube placed. EXAM: PORTABLE ABDOMEN - 1 VIEW COMPARISON:  CT Abdomen and Pelvis 07/19/2018. FINDINGS: Semi upright AP views of the chest and Portable AP supine view of the abdomen at 0646 hours. Enteric tube courses into the airway and into the left mainstem bronchus terminating at the lower aspect of the left hilum on both views. Right PICC line in place. Left abdominal drainage catheter remains in place. Streaky opacity at the left base seen to be a mix of pleural fluid and airspace disease appears stable since yesterday. The right lung is clear allowing for portable technique. Stable bowel gas pattern, nonobstructed. No acute osseous abnormality identified. IMPRESSION: 1. Malpositioned enteric tube is in the airway, left lower lobe bronchus. 2. Patchy left lung base opacity is stable since the CT yesterday due to combined pleural fluid and airspace disease. 3.  Eight abdominal drainage catheter and stable bowel gas pattern. Electronically Signed: By: Genevie Ann M.D. On: 07/20/2018 07:23   [place] Ekg Site Rite  Result Date: 07/16/2018 If Site Rite image not attached, placement could not be confirmed due to current cardiac rhythm.    Phillips Climes M.D on 07/25/2018 at 3:03 PM  Between 7am to 7pm - Pager - [phone]  After 7pm go to [email or web address] - password [facility]  [facility] -  Office  [phone]

## 2018-07-25 NOTE — Progress Notes (Signed)
ATTESTATION & SIGNATURE   STAFF NOTE: I, Dr Ann Lions have personally reviewed patient's available data, including medical history, events of note, physical examination and test results as part of my evaluation. I have discussed with resident/NP and other care providers such as pharmacist, RN and RRT.  In addition,  I personally evaluated patient and elicited key findings of   S: evaluated at bedside 5:54 PM . Spoke to [place] over phone. Only family DPOA. Lives in [place], [place] and can only get here 07/26/18  . She says patient probably unaware of cancer diagnosis. She says she and patient/dad have never discussed goals of care. Says goals by treating team and patient/family not yet fully established. 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 She is unaware of AKI. REported current worsening resp distress . She is ok with intubation. She sounded somewhat overwhelmed  O: Obese Confused. Moans RR 48 Mildly paradoxical Obese  Recent Labs  Lab 07/25/18 1411  PHART 7.339*  PCO2ART BELOW REPORTABLE RANGE  PO2ART 97.0  HCO3 9.6*  O2SAT 97.1   Recent Labs  Lab 07/23/18 0346 07/24/18 0850 07/25/18 0500  HGB 8.9* 8.8* 8.9*  HCT 29.5* 28.7* 30.3*  WBC 20.3* 18.3* 18.6*  PLT 599* 552* 551*   Recent Labs  Lab 07/19/18 0319 07/20/18 0342  07/21/18 0352 07/22/18 0409 07/23/18 0346 07/24/18 0420 07/25/18 0500  NA  --  152*   < > 153* 156* 153* 151* 151*  151*  K  --  4.3   < > 4.5 5.0 4.5 4.3 4.2  4.1  CL  --  117*   < > 119* 126* 129* 124* 128*  127*  CO2  --  20*   < > 19* 19* 18* 16* 13*  13*  GLUCOSE  --  273*   < > 326* 280* 267* 249* 235*  228*  BUN  --  85*   < > 98* 99* 88* 84* 94*  93*  CREATININE  --  3.66*   < > 4.19* 4.12* 3.88* 3.80* 4.35*  4.27*  CALCIUM  --  9.5   < > 9.2 9.1 9.1 9.5 9.7  9.7  MG 2.6* 2.6*  --   --  2.3 2.1 2.0 2.0  PHOS 4.6 4.2  --   --   --  3.6 4.2 3.5   < > = values in this interval not  displayed.   I/O last 3 completed shifts: In: 2643.8 [I.V.:2377.7; Other:20; IV Piggyback:246] Out: 6759 [FMBWG:6659; Drains:63; Stool:60]  No results for input(s): LATICACIDVEN, PROCALCITON in the last 168 hours.   A: acute resp failure - severe distress Acute encephalopathy Obeseity, long hospital stay,. Acute renal failure, Acidosis. Colon cancer  P: Move to ICU Intubate Full Code -s hould consider CRRt/HD to help come off vent Ongoing goals after daughter arrives 07/26/18 Judeth Horn)     The patient is critically ill with multiple organ systems failure and requires high complexity decision making for assessment and support, frequent evaluation and titration of therapies, application of advanced monitoring technologies and extensive interpretation of multiple databases.   Critical Care Time devoted to patient care services described in this note is  30  Minutes. This time reflects time of care of this signee Dr Brand Males. This critical care time does not reflect procedure time, or teaching time or supervisory time of PA/NP/Med student/Med Resident etc but could involve care discussion time     Dr. Brand Males,  M.D., F.C.C.P Pulmonary and Critical Care Medicine Staff Physician [facility] Pager: [phone], If no answer or between  15:00h - 7:00h: call [phone]  07/25/2018 5:54 PM

## 2018-07-25 NOTE — Progress Notes (Addendum)
[place]  [phone]   HEMATOLOGY ONCOLOGY INPATIENT CONSULTATION   Jerry Green  DOB: 05/15/1956  MR#: [id number]  CSN#: [id number]    Requesting Physician: [facility]  Patient Care Team: Ollen Bowl, MD as PCP - General (Family Medicine)  Reason for consult: metastatic colon cancer   History of present illness:  Mr. Gleaves is a 62 yo AAM with PMH of HTN and DM, was admitted on 07/12/2018 for bowel obstruction with perforation, he underwent urgent left hemicolectomy by Dr. Donne Hazel on 07/12/2018.  His postop course has been complicated with peritonitis with abscess, required drainage, prolonged antibiotics, and acute renal failure.  His overall condition has deteriorated, and we are called to evaluate his cancer prognosis.   Pt has developed acute encephalopathy, respiratory failure with tachypnea, he was not able to answer questions when I saw him, history is mainly from his chart review.  Per Dr. Cristal Generous OP note and I spoke with him today, he was found to have colon cancer at the splenic flexure which was invading the peritoneal wall and surrounding structures, with bowel perforation from the cancer,  and one liver lesion in right lobe near the dome was found and biopsy confirmed metastatic cancer, no obvious peritoneal carcinomatosis was found during surgery. Dr. Donne Hazel was able to do left hemicolectomy, but did not resect the role score structures due to his metastatic disease.  The surgical pathology showed invasive adenocarcinoma with direct invading to mesentery, 3 out of 18 lymph nodes were positive, radial margin (mesentery) was positive.  As expected, patient developed peritonitis and multiple abdominal abscess, required drainage tube placement by IR, and broad coverage of antibiotics.  He also developed acute renal failure after surgery, which has been getting worse lately.  Nephrology was consulted, due to his stage IV colon cancer,  dialysis was not recommended.  His CT scan of abdomen and pelvis without contrast on July 19, 2018 ( after surgery) showed multifocal abdominal abscess, and fruits and decreased fluid structure along the dome of the liver measuring 5.2 cm, no other metastatic disease was found on the CT scan.    MEDICAL HISTORY:  Past Medical History:  Diagnosis Date  . Diabetes mellitus type II, controlled ([place])   . GERD (gastroesophageal reflux disease)   . HTN (hypertension)     SURGICAL HISTORY: Past Surgical History:  Procedure Laterality Date  . APPLICATION OF WOUND VAC N/A 07/06/2018   Procedure: APPLICATION OF WOUND VAC;  Surgeon: Rolm Bookbinder, MD;  Location: [place];  Service: General;  Laterality: N/A;  . COLON RESECTION N/A 07/27/2018   Procedure: PARTIAL COLON RESECTION WITH COLOSTOMY;  Surgeon: Rolm Bookbinder, MD;  Location: [place];  Service: General;  Laterality: N/A;  . LAPAROTOMY N/A 07/30/2018   Procedure: EXPLORATORY LAPAROTOMY;  Surgeon: Rolm Bookbinder, MD;  Location: [place];  Service: General;  Laterality: N/A;  . LIVER BIOPSY N/A 07/30/2018   Procedure: LIVER BIOPSY;  Surgeon: Rolm Bookbinder, MD;  Location: [place];  Service: General;  Laterality: N/A;    SOCIAL HISTORY: Social History   Socioeconomic History  . Marital status: Divorced    Spouse name: Not on file  . Number of children: Not on file  . Years of education: Not on file  . Highest education level: Not on file  Occupational History  . Not on file  Social Needs  . Financial resource strain: Not on file  . Food insecurity:    Worry: Not on file  Inability: Not on file  . Transportation needs:    Medical: Not on file    Non-medical: Not on file  Tobacco Use  . Smoking status: Never Smoker  . Smokeless tobacco: Never Used  Substance and Sexual Activity  . Alcohol use: Not Currently    Frequency: Never  . Drug use: Never  . Sexual activity: Not on file  Lifestyle  . Physical activity:     Days per week: Not on file    Minutes per session: Not on file  . Stress: Not on file  Relationships  . Social connections:    Talks on phone: Not on file    Gets together: Not on file    Attends religious service: Not on file    Active member of club or organization: Not on file    Attends meetings of clubs or organizations: Not on file    Relationship status: Not on file  . Intimate partner violence:    Fear of current or ex partner: Not on file    Emotionally abused: Not on file    Physically abused: Not on file    Forced sexual activity: Not on file  Other Topics Concern  . Not on file  Social History Narrative  . Not on file    FAMILY HISTORY: Family History  Problem Relation Age of Onset  . Diabetes Maternal Grandmother     ALLERGIES:  is allergic to aspirin.  MEDICATIONS:  Current Facility-Administered Medications  Medication Dose Route Frequency Provider Last Rate Last Dose  . albuterol (PROVENTIL) (2.5 MG/3ML) 0.083% nebulizer solution 2.5 mg  2.5 mg Nebulization Q2H PRN McClung, Jeffrey T, MD      . anidulafungin (ERAXIS) 100 mg in sodium chloride 0.9 % 100 mL IVPB  100 mg Intravenous Q24H Purohit, Shrey C, MD 78 mL/hr at 07/25/18 1613 100 mg at 07/25/18 1613  . chlorhexidine (PERIDEX) 0.12 % solution 15 mL  15 mL Mouth Rinse BID Abrol, Nayana, MD   15 mL at 07/25/18 0842  . DAPTOmycin (CUBICIN) 850 mg in sodium chloride 0.9 % IVPB  850 mg Intravenous Q48H Buehrle, Elizabeth J, RPH 234 mL/hr at 07/24/18 2056 850 mg at 07/24/18 2056  . fentaNYL (SUBLIMAZE) bolus via infusion 50 mcg  50 mcg Intravenous Q1H PRN Ramaswamy, Murali, MD      . fentaNYL (SUBLIMAZE) injection 100 mcg  100 mcg Intravenous Once Ramaswamy, Murali, MD      . fentaNYL (SUBLIMAZE) injection 50 mcg  50 mcg Intravenous Once Ramaswamy, Murali, MD      . fentaNYL 2500mcg in NS 250mL (10mcg/ml) infusion-PREMIX  25-400 mcg/hr Intravenous Continuous Ramaswamy, Murali, MD 5 mL/hr at 07/25/18 1937 50 mcg/hr  at 07/25/18 1937  . free water 200 mL  200 mL Per Tube Q8H Desai, Rahul P, PA-C      . heparin injection 5,000 Units  5,000 Units Subcutaneous Q8H Purohit, Shrey C, MD   5,000 Units at 07/25/18 1537  . insulin aspart (novoLOG) injection 0-20 Units  0-20 Units Subcutaneous Q4H Dang, Thuy D, RPH   11 Units at 07/25/18 1538  . insulin aspart (novoLOG) injection 1-3 Units  1-3 Units Subcutaneous Q4H Ramaswamy, Murali, MD      . [START ON 07/26/2018] insulin glargine (LANTUS) injection 40 Units  40 Units Subcutaneous Daily Elgergawy, Dawood S, MD      . MEDLINE mouth rinse  15 mL Mouth Rinse q12n4p Abrol, Nayana, MD   15 mL at 07/25/18 1200  . midazolam (  VERSED) injection 1-4 mg  1-4 mg Intravenous Q2H PRN Brand Males, MD      . piperacillin-tazobactam (ZOSYN) IVPB 3.375 g  3.375 g Intravenous Q8H Lyndee Leo, [facility]   Stopped at 07/25/18 1147  . potassium chloride 10 MEQ/100ML IVPB           . potassium chloride 10 MEQ/100ML IVPB           . potassium chloride 10 MEQ/100ML IVPB           . sodium bicarbonate 150 mEq in dextrose 5 % 1,000 mL infusion   Intravenous Continuous Brand Males, MD 125 mL/hr at 07/25/18 1934    . sodium bicarbonate injection 100 mEq  100 mEq Intravenous Once Brand Males, MD      . sodium chloride flush (NS) 0.9 % injection 10-40 mL  10-40 mL Intracatheter PRN Sampson Goon, MD   10 mL at 07/25/18 1711  . sodium chloride flush (NS) 0.9 % injection 5 mL  5 mL Intracatheter Q8H Hoss, Arthur, MD   5 mL at 07/25/18 0514  . TPN ADULT (ION)   Intravenous Continuous TPN Romona Curls, RPH 100 mL/hr at 07/25/18 1711      REVIEW OF SYSTEMS:   Due to patient's encephalopathy, review of system was not able to obtain  PHYSICAL EXAMINATION: ECOG PERFORMANCE STATUS: 4 - Bedbound  Vitals:   07/25/18 1947 07/25/18 1950  BP:    Pulse:    Resp:    Temp:  (!) 101.6 F (38.7 C)  SpO2: 100%    Filed Weights   07/23/18 0500 07/24/18 0500 07/25/18 0500  Weight:  248 lb 7.3 oz (112.7 kg) (!) 314 lb (142.4 kg) (!) 316 lb (143.3 kg)    GENERAL: Lethargic, arousable, does not follow command or answer questions. SKIN: skin color, texture, turgor are normal, no rashes or significant lesions NECK: supple, thyroid normal size, non-tender, without nodularity LYMPH:  no palpable lymphadenopathy in the cervical, axillary or inguinal LUNGS:  (+) Tachypnea, clear to auscultation and percussion with normal breathing effort HEART: regular rate & rhythm and no murmurs and no lower extremity edema ABDOMEN:abdomen soft, some tenderness, (+) colostomy bag, surgical incision covered Musculoskeletal:no cyanosis of digits and no clubbing  PSYCH: alert & oriented x 3 with fluent speech   LABORATORY DATA:  I have reviewed the data as listed Lab Results  Component Value Date   WBC 18.6 (H) 07/25/2018   HGB 8.9 (L) 07/25/2018   HCT 30.3 (L) 07/25/2018   MCV 94.7 07/25/2018   PLT 551 (H) 07/25/2018   Recent Labs    07/21/18 0035  07/23/18 0346 07/24/18 0420 07/25/18 0500  NA 151*   < > 153* 151* 151*  151*  K 4.5   < > 4.5 4.3 4.2  4.1  CL 121*   < > 129* 124* 128*  127*  CO2 20*   < > 18* 16* 13*  13*  GLUCOSE 305*   < > 267* 249* 235*  228*  BUN 95*   < > 88* 84* 94*  93*  CREATININE 4.33*   < > 3.88* 3.80* 4.35*  4.27*  CALCIUM 9.0   < > 9.1 9.5 9.7  9.7  GFRNONAA 13*   < > 15* 16* 13*  14*  GFRAA 15*   < > 18* 18* 15*  16*  PROT 8.1  --  8.8*  --  8.9*  ALBUMIN 2.4*  --  2.1*  --  2.0*  AST 148*  --  94*  --  138*  ALT 137*  --  114*  --  105*  ALKPHOS 128*  --  125  --  119  BILITOT 1.4*  --  1.0  --  1.0   < > = values in this interval not displayed.    RADIOGRAPHIC STUDIES: I have personally reviewed the radiological images as listed and agreed with the findings in the report. Ct Abdomen Pelvis Wo Contrast  Result Date: 07/19/2018 CLINICAL DATA:  Abdominal pain.  Evaluate for abscess. EXAM: CT ABDOMEN AND PELVIS WITHOUT CONTRAST  TECHNIQUE: Multidetector CT imaging of the abdomen and pelvis was performed following the standard protocol without IV contrast. COMPARISON:  None. FINDINGS: Lower chest: There is a small to moderate left pleural effusion with left lower lobe airspace consolidation. Subsegmental atelectasis noted within the right lower lobe. Posterior left upper lobe pulmonary nodule measures 9 mm, image 3/4. Hepatobiliary: Gas, fluid and debris filled structure along the dome of the liver measures 5.2 by 3.0 by 3.0 cm. I cannot tell if this is intraparenchymal, subcapsular or between the dome of liver and right hemidiaphragm. Within the limitations of unenhanced technique no additional focal liver abnormalities identified. The gallbladder appears normal. No biliary dilatation. Pancreas: Unremarkable. No pancreatic ductal dilatation or surrounding inflammatory changes. Spleen: Normal in size without focal abnormality. Adrenals/Urinary Tract: Normal adrenal glands. Unremarkable appearance of both kidneys. The urinary bladder appears normal. Stomach/Bowel: Nasogastric tube tip is in the body of stomach. The small bowel loops have a normal caliber. A right lower quadrant colostomy is identified. Long segment Hartmann's pouch is identified which begins at the level of the distal descending colon, image 55/3. Vascular/Lymphatic: Mild aortic atherosclerosis. No aneurysm. Mild retroperitoneal adenopathy identified. No enlarged pelvic lymph nodes. 1 cm left retroperitoneal lymph node is identified, image 42/3. Adjacent lymph node measures 1.1 cm, image 44/3. Reproductive: Prostate is unremarkable. Other: A surgical drainage catheter enters the left abdomen and terminates along the left pericolic gutter, image 39/2. There is a fluid collection within the left upper quadrant of the abdomen between the spleen and left hemidiaphragm which measures 12.4 cm in maximum dimension, image 75/6. The drainage catheter does not appear to access this  fluid collection. a second possible fluid collection is identified within the pelvis between the anterior wall of rectum and posterior wall of bladder. This measures 5.6 x 3.1 by 5.5 cm. Scattered nodular densities are identified within the peritoneal cavity. The largest is in the left upper quadrant of the abdomen measuring 1.2 cm, image 50/6. Along the right lateral hemiabdomen there is a 7 mm peritoneal nodule, image 42/6. Musculoskeletal: Degenerative disc disease identified within the lower lumbar spine. IMPRESSION: 1. There are 3 fluid collections identified within the abdomen. The largest is in the left upper quadrant of the abdomen between the left hemidiaphragm and spleen. Within the pelvis there is a fluid collection between the anterior wall of rectum and posterior wall of bladder. Finally, along the dome of the right lobe of liver there is a gas, debris and fluid collection which may be within segment 7 of the liver or between the liver and right hemidiaphragm. These are all incompletely characterized without IV contrast but may represent multiple abscesses. 2. Status post right abdominal colostomy with long segment Hartmann's pouch. No abnormal bowel dilatation identified. 3. Left lower lobe airspace consolidation and small pleural effusion. Cannot rule out pneumonia. 4. Mild retroperitoneal adenopathy. 5. Pulmonary nodule in the left   lobe upper lobe measures 9 mm. In a patient that may be at increased risk for lung metastasis consider further evaluation with nonemergent contrast enhanced CT of the chest for staging purposes. 6. A few scattered peritoneal nodules are identified. Specificity somewhat diminished due to extensive postoperative changes within the abdomen. Clinical correlation for any surgical findings suggestive of peritoneal disease. Electronically Signed   By: Taylor  Stroud M.D.   On: 07/19/2018 16:50   Dg Chest 1 View  Result Date: 07/25/2018 CLINICAL DATA:  Tachypnea EXAM: CHEST   1 VIEW COMPARISON:  July 16, 2018 FINDINGS: Central catheter tip is in the superior vena cava. Previous right jugular catheter and nasogastric tube have been removed. No pneumothorax. There is hazy air base opacity in the left mid lower lung zones with small left pleural effusion. Right lung is clear. Heart is upper normal in size with pulmonary vascularity normal. No adenopathy. No bone lesions. There are drains in the upper abdominal regions. IMPRESSION: Hazy opacity in portions of the left mid lower lung zones, concerning for pneumonia, similar to prior study. Small left pleural effusion. Right lung clear. Stable cardiac silhouette. Central catheter tip in superior vena cava. No pneumothorax. Electronically Signed   By: William  Woodruff III M.D.   On: 07/25/2018 08:11   Dg Chest 2 View  Result Date: 07/30/2018 CLINICAL DATA:  History of colon cancer abdominal pain EXAM: CHEST - 2 VIEW COMPARISON:  None. FINDINGS: Cardiac shadow is within normal limits. The lungs are well aerated bilaterally. A nodular appearing density is noted over the left mid to lower lung not well appreciated on the lateral projection. This may be related to nipple shadow. Repeat frontal imaging with nipple markers is recommended. No other focal abnormality is noted. IMPRESSION: Question left nipple shadow.  Repeat imaging is recommended. Electronically Signed   By: Mark  Lukens M.D.   On: 07/04/2018 10:00   Dg Abd 1 View  Result Date: 07/15/2018 CLINICAL DATA:  Nasogastric tube placement. EXAM: ABDOMEN - 1 VIEW COMPARISON:  Chest radiograph 1 day prior FINDINGS: Nasogastric terminates at the body of the stomach. Probable small left pleural effusion with adjacent left lower lobe airspace disease. Gas-filled small bowel loops in the upper left abdomen. No gross free intraperitoneal air. IMPRESSION: Nasogastric tube terminating at the body of the stomach. Electronically Signed   By: Kyle  Talbot M.D.   On: 07/15/2018 08:18   Ct  Head Wo Contrast  Result Date: 07/21/2018 CLINICAL DATA:  62 y/o M; altered encephalopathy. History of hypertension and diabetes. EXAM: CT HEAD WITHOUT CONTRAST TECHNIQUE: Contiguous axial images were obtained from the base of the skull through the vertex without intravenous contrast. COMPARISON:  None. FINDINGS: Brain: No evidence of acute infarction, hemorrhage, hydrocephalus, extra-axial collection or mass lesion/mass effect. Nonspecific white matter hypodensities are compatible with mild chronic microvascular ischemic changes and there is mild volume loss of the brain for age. Vascular: Calcific atherosclerosis of carotid siphons. No hyperdense vessel identified. Skull: Normal. Negative for fracture or focal lesion. Sinuses/Orbits: Mild diffuse paranasal sinus mucosal thickening. Normal aeration of the mastoid air cells. Orbits are unremarkable. Other: None. IMPRESSION: 1. No acute intracranial abnormality identified. 2. Mild chronic microvascular ischemic changes and mild volume loss of the brain for age. Electronically Signed   By: Lance  Furusawa-Stratton M.D.   On: 07/21/2018 00:37   Mr Brain Wo Contrast  Result Date: 07/24/2018 CLINICAL DATA:  62-year-old male with altered mental status. EXAM: MRI HEAD WITHOUT CONTRAST TECHNIQUE: Multiplanar, multiecho   pulse sequences of the brain and surrounding structures were obtained without intravenous contrast. COMPARISON:  Head CT without contrast 07/21/2018 FINDINGS: Brain: No restricted diffusion to suggest acute infarction. No midline shift, mass effect, evidence of mass lesion, ventriculomegaly, extra-axial collection or acute intracranial hemorrhage. Cervicomedullary junction and pituitary are within normal limits. Questionable brainstem and cerebellar volume loss out of proportion to cerebral hemisphere volume loss. Scattered and patchy cerebral white matter T2 and FLAIR hyperintensity. Marginal involvement of the right lentiform nuclei. No cortical  encephalomalacia or chronic cerebral blood products identified. The other deep gray matter nuclei appear within normal limits. No brainstem or cerebellar signal abnormality identified. Vascular: Major intracranial vascular flow voids are preserved. Skull and upper cervical spine: Negative visible cervical spine. Normal bone marrow signal. Sinuses/Orbits: Normal orbits soft tissues. Trace paranasal sinus mucosal. Other: Mastoids are clear. Grossly normal visible internal auditory structures appear normal. Scalp and face soft tissues appear negative. IMPRESSION: 1.  No acute intracranial abnormality. 2. Questionable disproportionate volume loss of the brainstem and cerebellum, nonspecific. 3. Mild to moderate for age nonspecific signal changes in the cerebral white matter, most commonly due to chronic small vessel disease. Electronically Signed   By: Genevie Ann M.D.   On: 07/24/2018 12:57   Dg Chest Port 1 View  Result Date: 07/25/2018 CLINICAL DATA:  CT and OG placement EXAM: PORTABLE CHEST 1 VIEW COMPARISON:  07/25/2018, 07/16/2018, 07/14/2018 FINDINGS: Endotracheal tube tip is about 2.5 cm superior to the carina. Right upper extremity catheter tip over the cavoatrial region. Esophageal tube tip in the left upper quadrant. Bilateral drainage catheters within the upper quadrants. Tiny left pleural effusion. Atelectasis versus minimal infiltrate at the medial left base. Stable cardiomediastinal silhouette. IMPRESSION: 1. Endotracheal tube tip about 2.5 cm superior to carina. Esophageal tube tip is in the left upper quadrant 2. Trace left pleural effusion with hazy atelectasis or infiltrate at the left base Electronically Signed   By: Donavan Foil M.D.   On: 07/25/2018 19:18   Dg Chest Port 1 View  Result Date: 07/16/2018 CLINICAL DATA:  Shortness of breath. History of pleural effusion, peritoneal carcinomatosis EXAM: PORTABLE CHEST 1 VIEW COMPARISON:  Portable chest x-ray of July 14 2018 FINDINGS: The lungs  are mildly hypoinflated. There is persistent increased density in the left mid and lower lung. Small left pleural effusion. The right lung is clear. The heart is normal in size. The hilar structures are prominent greatest on the left. The esophagogastric tube tip projects in the gastric cardia with the proximal port at the level of the GE junction. The right internal jugular venous catheter tip projects over the junction of the proximal and midportions of the SVC. The bony structures are unremarkable. IMPRESSION: Interval extubation of the trachea. Persistent bilateral hypoinflation. Left basilar atelectasis or pneumonia with small left pleural effusion, stable. The esophagogastric tube merits advancement by between 5 and 10 cm to assure that the proximal port is positioned below the GE junction. Electronically Signed   By: David  Martinique M.D.   On: 07/16/2018 07:32   Dg Chest Port 1 View  Result Date: 07/14/2018 CLINICAL DATA:  Respiratory failure. EXAM: PORTABLE CHEST 1 VIEW COMPARISON:  One-view chest x-ray 07/12/2018 FINDINGS: The heart size is normal. Endotracheal tube is stable position. The side port of the NG tube is in the distal esophagus. A right IJ line is stable. A left pleural effusion and basilar airspace disease is increasing. The right lung is clear. Lung volumes are low. IMPRESSION: 1.  The side port of the NG tube is in the esophagus and could be advanced for more optimal positioning. 2. Support apparatus is otherwise stable. Satisfactory positioning of endotracheal tube. 3. Low lung volumes with progressive left pleural effusion and airspace disease, likely atelectasis. Infection is not excluded. Electronically Signed   By: Christopher  Mattern M.D.   On: 07/14/2018 08:26   Dg Chest Port 1 View  Result Date: 07/12/2018 CLINICAL DATA:  Endotracheal tube, recent exploratory laparotomy. EXAM: PORTABLE CHEST 1 VIEW COMPARISON:  07/04/2018. FINDINGS: Endotracheal tube terminates 4.9 cm above  the carina. Nasogastric tube is followed into the stomach. Right IJ central line tip projects at the junction of the brachiocephalic veins. Heart size stable. Lungs are low in volume with minimal left basilar atelectasis. No airspace consolidation or pleural fluid. No pneumothorax. IMPRESSION: Low lung volumes with minimal left basilar atelectasis. Electronically Signed   By: Melinda  Blietz M.D.   On: 07/12/2018 10:48   Dg Chest Port 1 View  Result Date: 07/24/2018 CLINICAL DATA:  Intubated patient. Bowel obstruction, peritoneal carcinomatosis. EXAM: PORTABLE CHEST 1 VIEW COMPARISON:  PA and lateral chest x-ray of July 10, 2018 FINDINGS: The lungs are reasonably well inflated and clear. A left-sided nodular density seen on yesterday's study is not evident today. The heart and pulmonary vascularity are normal. The mediastinum is normal in width. The endotracheal tube tip lies approximately 4 cm above the carina. The esophagogastric tube tip projects in the gastric cardia with the proximal port at or just above the GE junction. The right internal jugular venous catheter tip projects at the junction of the proximal and midportions of the SVC. IMPRESSION: Intubated patient. No acute cardiopulmonary abnormality. No free subdiaphragmatic gas collections are observed. Advancement of the nasogastric tube by 5-10 cm is needed to assure that the proximal port lies below the GE junction. Electronically Signed   By: David  Jordan M.D.   On: 07/05/2018 14:16   Dg Abd Portable 1v  Addendum Date: 07/20/2018   ADDENDUM REPORT: 07/20/2018 07:34 ADDENDUM: Study discussed by telephone with Nurse Kelly Duffy on 07/20/2018 at 0731 hours. Electronically Signed   By: H  Hall M.D.   On: 07/20/2018 07:34   Result Date: 07/20/2018 CLINICAL DATA:  62-year-old male enteric tube placed. EXAM: PORTABLE ABDOMEN - 1 VIEW COMPARISON:  CT Abdomen and Pelvis 07/19/2018. FINDINGS: Semi upright AP views of the chest and Portable AP  supine view of the abdomen at 0646 hours. Enteric tube courses into the airway and into the left mainstem bronchus terminating at the lower aspect of the left hilum on both views. Right PICC line in place. Left abdominal drainage catheter remains in place. Streaky opacity at the left base seen to be a mix of pleural fluid and airspace disease appears stable since yesterday. The right lung is clear allowing for portable technique. Stable bowel gas pattern, nonobstructed. No acute osseous abnormality identified. IMPRESSION: 1. Malpositioned enteric tube is in the airway, left lower lobe bronchus. 2. Patchy left lung base opacity is stable since the CT yesterday due to combined pleural fluid and airspace disease. 3. Eight abdominal drainage catheter and stable bowel gas pattern. Electronically Signed: By: H  Hall M.D. On: 07/20/2018 07:23   Us Ekg Site Rite  Result Date: 07/16/2018 If Site Rite image not attached, placement could not be confirmed due to current cardiac rhythm.   ASSESSMENT & PLAN: 62-year-old African-American male, with past medical history of hypertension and diabetes, presented with newly diagnosed colon cancer at   the splenic flexure, and oligo liver metastasis, unfortunately had a bowel perforation, and underwent urgent left hemicolectomy on 07/18/2018.  1. 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 Bowel perforation secondary to colon cancer 3. Abdominal abscess 4. AKI with metabolic acidosis 5. Respiratory failure  6.  Metabolic encephalopathy, secondary to renal and respiratory failure 7. HTN 8. DM with hyperglycemia  9. Anemia, secondary to colon cancer, ? IDA and infection  10.  Leukocytosis secondary infection 11.  Thrombocytosis, secondary to metastatic cancer   Recommendations: -I have reviewed his chart extensively, spoke with Dr. Donne Hazel and Dr. Chase Caller today  -I called his daughter and left a VM for her to call me back tomorrow    -Patient has stage IV colon cancer, with oligo liver metastasis, residual disease at the surgical site of left hemicolectomy, no overt peritoneal carcinomatosis, although he is at very high risk for peritoneal carcinomatosis due to the bowel perforation from the cancer.  At this point, although his cancer is incurable, given his limited metastatic disease, I do not think he would die from cancer in the near future.  If he did not have all the complications he has now, typically he may still live 6-12 months even without cancer treatment  -However he has developed abdominal abscess, acute renal failure, metabolic acidosis, respiratory failure, plus his chronic comorbidities, hypertension, diabetes, morbid obesity, he is at very high risk for death during this hospital stay  -Per Dr. Golden Pop conversation with pt's daughter, "pt is a Nurse, adult" and she wants to continue full code for him. I think it is reasonable at this point. I think it's also reasonable for nephrology team to discuss dialysis with his daughter if they feels it's needed, to see if pt may turn around in the next few weeks  -certainly if we have tried all medical treatment options, and he does not improve in a few week, we should discuss comfort care wit pt's daughter  -No role for chemo or any other anticancer treatment at this point  -I will talk to his daughter tomorrow, and f/u as needed during his hospital stay.  -I appreciate the opportunity to participate in this patient's care, please do not hesitate to call me if there are any questions -I will check iron study tomorrow morning to see if he needs iv iron   All questions were answered. The patient knows to call the clinic with any problems, questions or concerns.      Truitt Merle, MD 07/25/2018 8:06 PM

## 2018-07-25 NOTE — Progress Notes (Addendum)
Call from [facility] MD 5:10 PM   LOS 15 days. Now with worsening mentation and increased wob. Has renal failure and secondary tachypnea Apparently renal svc hesistatnt to start HD due to stage 4 colon cancer Still full code MEWS score 6    Recent Labs  Lab 07/25/18 1411  PHART 7.339*  PCO2ART BELOW REPORTABLE RANGE  PO2ART 97.0  HCO3 9.6*  O2SAT 97.1   Recent Labs  Lab 07/23/18 0346 07/24/18 0850 07/25/18 0500  HGB 8.9* 8.8* 8.9*  HCT 29.5* 28.7* 30.3*  WBC 20.3* 18.3* 18.6*  PLT 599* 552* 551*   Recent Labs  Lab 07/19/18 0319 07/20/18 0342  07/21/18 0352 07/22/18 0409 07/23/18 0346 07/24/18 0420 07/25/18 0500  NA  --  152*   < > 153* 156* 153* 151* 151*  151*  K  --  4.3   < > 4.5 5.0 4.5 4.3 4.2  4.1  CL  --  117*   < > 119* 126* 129* 124* 128*  127*  CO2  --  20*   < > 19* 19* 18* 16* 13*  13*  GLUCOSE  --  273*   < > 326* 280* 267* 249* 235*  228*  BUN  --  85*   < > 98* 99* 88* 84* 94*  93*  CREATININE  --  3.66*   < > 4.19* 4.12* 3.88* 3.80* 4.35*  4.27*  CALCIUM  --  9.5   < > 9.2 9.1 9.1 9.5 9.7  9.7  MG 2.6* 2.6*  --   --  2.3 2.1 2.0 2.0  PHOS 4.6 4.2  --   --   --  3.6 4.2 3.5   < > = values in this interval not displayed.     Plan Ccm to bedside     SIGNATURE    Dr. Brand Males, M.D., F.C.C.P,  Pulmonary and Critical Care Medicine Staff Physician, [facility] Director - Interstitial Lung Disease  Program  Pulmonary [place] [facility] [place], [place], [address]  Pager: [phone], If no answer or between  15:00h - 7:00h: call [phone] Telephone: [phone]  5:12 PM 07/25/2018

## 2018-07-25 NOTE — Progress Notes (Signed)
Patient ID: Jerry Green, male   DOB: 11/21/1955, 62 y.o.   MRN: [id number]    14 Days Post-Op  Subjective: Patient still very confused today.  Denies abdominal pain.  Clear liquid tray in the room, but he hasn't touched it.  Unsure he actually knows it's there.  Objective: Vital signs in last 24 hours: Temp:  [100.5 F (38.1 C)] 100.5 F (38.1 C) (10/23 0800) Pulse Rate:  [111-135] 116 (10/23 0500) Resp:  [36-55] 41 (10/23 0500) BP: (133-145)/(81-90) 138/87 (10/23 0800) SpO2:  [99 %-100 %] 100 % (10/23 0500) Weight:  [143.3 kg] 143.3 kg (10/23 0500) Last BM Date: 07/23/18  Intake/Output from previous day: 10/22 0701 - 10/23 0700 In: 1477.8 [I.V.:1295.4; IV Piggyback:162.5] Out: 2055 [Urine:1975; Drains:30; Stool:50] Intake/Output this shift: No intake/output data recorded.  PE: Heart: tachy Lungs: tachypnea Abd: soft, midline wound with dehiscence but no evisceration, but possible bowel loop at the top of the wound.  Surgical drain with minimal brown output.  Both IR drains with minimal output. Left drain with serous fluid and right JP with serosang.  Lab Results:  Recent Labs    07/24/18 0850 07/25/18 0500  WBC 18.3* 18.6*  HGB 8.8* 8.9*  HCT 28.7* 30.3*  PLT 552* 551*   BMET Recent Labs    07/24/18 0420 07/25/18 0500  NA 151* 151*  151*  K 4.3 4.2  4.1  CL 124* 128*  127*  CO2 16* 13*  13*  GLUCOSE 249* 235*  228*  BUN 84* 94*  93*  CREATININE 3.80* 4.35*  4.27*  CALCIUM 9.5 9.7  9.7   PT/INR No results for input(s): LABPROT, INR in the last 72 hours. CMP     Component Value Date/Time   NA 151 (H) 07/25/2018 0500   NA 151 (H) 07/25/2018 0500   K 4.2 07/25/2018 0500   K 4.1 07/25/2018 0500   CL 128 (H) 07/25/2018 0500   CL 127 (H) 07/25/2018 0500   CO2 13 (L) 07/25/2018 0500   CO2 13 (L) 07/25/2018 0500   GLUCOSE 235 (H) 07/25/2018 0500   GLUCOSE 228 (H) 07/25/2018 0500   BUN 94 (H) 07/25/2018 0500   BUN 93 (H) 07/25/2018 0500   CREATININE 4.35 (H) 07/25/2018 0500   CREATININE 4.27 (H) 07/25/2018 0500   CALCIUM 9.7 07/25/2018 0500   CALCIUM 9.7 07/25/2018 0500   PROT 8.9 (H) 07/25/2018 0500   ALBUMIN 2.0 (L) 07/25/2018 0500   AST 138 (H) 07/25/2018 0500   ALT 105 (H) 07/25/2018 0500   ALKPHOS 119 07/25/2018 0500   BILITOT 1.0 07/25/2018 0500   GFRNONAA 13 (L) 07/25/2018 0500   GFRNONAA 14 (L) 07/25/2018 0500   GFRAA 15 (L) 07/25/2018 0500   GFRAA 16 (L) 07/25/2018 0500   Lipase  No results found for: LIPASE     Studies/Results: Dg Chest 1 View  Result Date: 07/25/2018 CLINICAL DATA:  Tachypnea EXAM: CHEST  1 VIEW COMPARISON:  July 16, 2018 FINDINGS: Central catheter tip is in the superior vena cava. Previous right jugular catheter and nasogastric tube have been removed. No pneumothorax. There is hazy air base opacity in the left mid lower lung zones with small left pleural effusion. Right lung is clear. Heart is upper normal in size with pulmonary vascularity normal. No adenopathy. No bone lesions. There are drains in the upper abdominal regions. IMPRESSION: Hazy opacity in portions of the left mid lower lung zones, concerning for pneumonia, similar to prior study. Small left pleural effusion. Right  lung clear. Stable cardiac silhouette. Central catheter tip in superior vena cava. No pneumothorax. Electronically Signed   By: Lowella Grip III M.D.   On: 07/25/2018 08:11   Mr Brain Wo Contrast  Result Date: 07/24/2018 CLINICAL DATA:  62 year old male with altered mental status. EXAM: MRI HEAD WITHOUT CONTRAST TECHNIQUE: Multiplanar, multiecho pulse sequences of the brain and surrounding structures were obtained without intravenous contrast. COMPARISON:  Head CT without contrast 07/21/2018 FINDINGS: Brain: No restricted diffusion to suggest acute infarction. No midline shift, mass effect, evidence of mass lesion, ventriculomegaly, extra-axial collection or acute intracranial hemorrhage. Cervicomedullary  junction and pituitary are within normal limits. Questionable brainstem and cerebellar volume loss out of proportion to cerebral hemisphere volume loss. Scattered and patchy cerebral white matter T2 and FLAIR hyperintensity. Marginal involvement of the right lentiform nuclei. No cortical encephalomalacia or chronic cerebral blood products identified. The other deep gray matter nuclei appear within normal limits. No brainstem or cerebellar signal abnormality identified. Vascular: Major intracranial vascular flow voids are preserved. Skull and upper cervical spine: Negative visible cervical spine. Normal bone marrow signal. Sinuses/Orbits: Normal orbits soft tissues. Trace paranasal sinus mucosal. Other: Mastoids are clear. Grossly normal visible internal auditory structures appear normal. Scalp and face soft tissues appear negative. IMPRESSION: 1.  No acute intracranial abnormality. 2. Questionable disproportionate volume loss of the brainstem and cerebellum, nonspecific. 3. Mild to moderate for age nonspecific signal changes in the cerebral white matter, most commonly due to chronic small vessel disease. Electronically Signed   By: Genevie Ann M.D.   On: 07/24/2018 12:57    Anti-infectives: Anti-infectives (From admission, onward)   Start     Dose/Rate Route Frequency Ordered Stop   07/23/18 1500  anidulafungin (ERAXIS) 100 mg in sodium chloride 0.9 % 100 mL IVPB     100 mg 78 mL/hr over 100 Minutes Intravenous Every 24 hours 07/22/18 1420     07/22/18 2000  DAPTOmycin (CUBICIN) 850 mg in sodium chloride 0.9 % IVPB     850 mg 234 mL/hr over 30 Minutes Intravenous Every 48 hours 07/22/18 1459     07/22/18 1530  anidulafungin (ERAXIS) 200 mg in sodium chloride 0.9 % 200 mL IVPB     200 mg 78 mL/hr over 200 Minutes Intravenous  Once 07/22/18 1420 07/22/18 1941   07/07/2018 1800  piperacillin-tazobactam (ZOSYN) IVPB 3.375 g     3.375 g 12.5 mL/hr over 240 Minutes Intravenous Every 8 hours 07/04/2018 1444      08/02/2018 0915  cefoTEtan (CEFOTAN) 2 g in sodium chloride 0.9 % 100 mL IVPB     2 g 200 mL/hr over 30 Minutes Intravenous To Short Stay 07/25/2018 0857 07/17/2018 1900   07/04/2018 0906  cefoTEtan in Dextrose 5% (CEFOTAN) 2-2.08 GM-%(50ML) IVPB    Note to Pharmacy:  [name]   : cabinet override      07/19/2018 0906 07/19/2018 2114   07/15/2018 1200  piperacillin-tazobactam (ZOSYN) IVPB 3.375 g  Status:  Discontinued     3.375 g 12.5 mL/hr over 240 Minutes Intravenous Every 8 hours 07/06/2018 0615 07/16/2018 1341   07/13/2018 0730  cefoTEtan (CEFOTAN) 2 g in sodium chloride 0.9 % 100 mL IVPB  Status:  Discontinued     2 g 200 mL/hr over 30 Minutes Intravenous On call to O.R. 07/14/2018 0723 07/05/2018 0559   07/03/2018 0615  piperacillin-tazobactam (ZOSYN) IVPB 3.375 g     3.375 g 100 mL/hr over 30 Minutes Intravenous  Once 07/03/2018 0612 07/14/2018 1943  Assessment/Plan Acute respiratory failure - on Vent- extubated 10/13 Acute renal failure Hypertension Diabetes GERD Remote tobacco use BMI 36 Anemia - transfused Hypokalemia- resolved  Hypernatremia Hyperchloremia Malnutrition - moderate - prealbumin 12.2 Hypernatremia-151 Post op ileus, improving Confusion Leukocytosis PNA  Perforated splenic flexure colon cancer with large and small bowel obstruction Metastatic Colonadenocarcinoma - liver Bx also positive S/PExploratory laparotomy, left colectomy and liver biopsy, 07/03/2018, Dr. Rolm Bookbinder POD#14 Abdominal wound dehiscence--no evisceration. Does not need any reoperative surgery. However, bowel loop appears to be getting WD dressing at the top of his wound.  Will add mepitel to the base of the top part of his wound to protect that loop of bowel. IR abdominal drain placement x 2 - 07/20/18 - minimal output, CX no growth to date -Interventional radiology following.  -WBC 18K today and stable, but patient tachy and with tachypnea.  CXR shows possible PNA.  Still febrile.   Unclear if source if just pulmonary or combination of abdomen and lungs.  Will likely need to go ahead and repeat CT scan tomorrow.  Lower surgical drain also has dark brown appearing output, minimal though. -cont clears for now.  He isn't really taking in anything.  FEN: CLD/ IV fluids/TNA ID: Zosyn 10/8 =>>10/20; Cubacin 10/20 -->  DVT: SCD/subcutaneous heparin Foley:none Follow up: Dr. Donne Hazel    LOS: 15 days    [name] , [facility] Surgery 07/25/2018, 10:47 AM Pager: [phone]

## 2018-07-25 NOTE — Consult Note (Signed)
[facility] Renal Consultation Note  Requesting MD: Elgergawy Indication for Consultation: AKI  HPI:  Jerry Green is a 62 y.o. male with past medical history significant for type 2 diabetes mellitus, hypertension on an ACE inhibitor and metformin.  He presented to medical attention on 10/8 with abdominal pain-creatinine was 1.1 at the time.  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  He was taken to the OR on 10/9-after that has had intra-abdominal abscesses with drains placed, wound dehiscence with no evisceration-it is mentioned that the plan is for chemotherapy after hospitalization.  After surgery on 10/9 creatinine increased to 1.6.  It then bumped to 3.4 with transient improvement down to 2.3.  Hovering around 3.0-3.5 without treatment from 10 /14-10/ 18.  The last 3 days creatinine 3.8, 4.27 and 4.35 and that is the reason for consultation.  Also of note BUN is 94 but also sodium is 151.  He apparently has been altered really throughout hospitalization, presumed to be due to critical illness and metabolic derangements.  Also of note, bicarb is 13-hemoglobin 8.9.  He is nonoliguric, there is no recent urinalysis on record, renal imaging on 10/17 does not reveal any hydro-  Creatinine, Ser  Date/Time Value Ref Range Status  07/25/2018 05:00 AM 4.35 (H) 0.61 - 1.24 mg/dL Final  07/25/2018 05:00 AM 4.27 (H) 0.61 - 1.24 mg/dL Final  07/24/2018 04:20 AM 3.80 (H) 0.61 - 1.24 mg/dL Final  07/23/2018 03:46 AM 3.88 (H) 0.61 - 1.24 mg/dL Final  07/22/2018 04:09 AM 4.12 (H) 0.61 - 1.24 mg/dL Final  07/21/2018 03:52 AM 4.19 (H) 0.61 - 1.24 mg/dL Final  07/21/2018 12:35 AM 4.33 (H) 0.61 - 1.24 mg/dL Final  07/20/2018 03:42 AM 3.66 (H) 0.61 - 1.24 mg/dL Final  07/19/2018 12:09 AM 3.41 (H) 0.61 - 1.24 mg/dL Final  07/18/2018 04:27 AM 3.26 (H) 0.61 - 1.24 mg/dL Final  07/18/2018 02:54 AM 3.06 (H) 0.61 - 1.24 mg/dL Final  07/17/2018 03:46 AM 3.23 (H)  0.61 - 1.24 mg/dL Final  07/16/2018 04:10 AM 3.10 (H) 0.61 - 1.24 mg/dL Final  07/15/2018 02:32 AM 2.55 (H) 0.61 - 1.24 mg/dL Final  07/14/2018 04:32 AM 2.32 (H) 0.61 - 1.24 mg/dL Final  07/13/2018 04:40 AM 2.61 (H) 0.61 - 1.24 mg/dL Final  07/12/2018 03:40 AM 3.42 (H) 0.61 - 1.24 mg/dL Final    Comment:    DELTA CHECK NOTED  07/25/2018 02:02 PM 1.61 (H) 0.61 - 1.24 mg/dL Final  07/03/2018 04:14 AM 1.14 0.61 - 1.24 mg/dL Final  07/28/2018 07:01 AM 1.06 0.61 - 1.24 mg/dL Final     PMHx:   Past Medical History:  Diagnosis Date  . Diabetes mellitus type II, controlled ([place])   . GERD (gastroesophageal reflux disease)   . HTN (hypertension)     Past Surgical History:  Procedure Laterality Date  . APPLICATION OF WOUND VAC N/A 07/15/2018   Procedure: APPLICATION OF WOUND VAC;  Surgeon: Rolm Bookbinder, MD;  Location: [facility];  Service: General;  Laterality: N/A;  . COLON RESECTION N/A 07/20/2018   Procedure: PARTIAL COLON RESECTION WITH COLOSTOMY;  Surgeon: Rolm Bookbinder, MD;  Location: [facility];  Service: General;  Laterality: N/A;  . LAPAROTOMY N/A 07/28/2018   Procedure: EXPLORATORY LAPAROTOMY;  Surgeon: Rolm Bookbinder, MD;  Location: [place] [facility];  Service: General;  Laterality: N/A;  . LIVER BIOPSY N/A 07/17/2018   Procedure: LIVER BIOPSY;  Surgeon: Rolm Bookbinder, MD;  Location: [facility];  Service:  General;  Laterality: N/A;    Family Hx:  Family History  Problem Relation Age of Onset  . Diabetes Maternal Grandmother     Social History:  reports that he has never smoked. He has never used smokeless tobacco. He reports that he drank alcohol. He reports that he does not use drugs.  Allergies:  Allergies  Allergen Reactions  . Aspirin Nausea Only    Medications: Prior to Admission medications   Medication Sig Start Date End Date Taking? Authorizing Provider  allopurinol (ZYLOPRIM) 100 MG tablet Take 100 mg by mouth daily. 06/14/18  Yes [provider]  enalapril  (VASOTEC) 5 MG tablet Take 5 mg by mouth daily. 06/14/18  Yes [provider]  glimepiride (AMARYL) 2 MG tablet Take 2 mg by mouth 2 (two) times daily. 07/04/18  Yes [provider]  metFORMIN (GLUCOPHAGE) 1000 MG tablet Take 1,000 mg by mouth 2 (two) times daily. 06/14/18  Yes [provider]  pantoprazole (PROTONIX) 40 MG tablet Take 40 mg by mouth daily. 06/14/18  Yes [provider]  simvastatin (ZOCOR) 40 MG tablet Take 40 mg by mouth daily. 06/14/18  Yes [provider]    I have reviewed the patient's current medications.  Labs:  Results for orders placed or performed during the hospital encounter of 07/09/2018 (from the past 48 hour(s))  Glucose, capillary     Status: Abnormal   Collection Time: 07/23/18  5:38 PM  Result Value Ref Range   Glucose-Capillary 231 (H) 70 - 99 mg/dL  Glucose, capillary     Status: Abnormal   Collection Time: 07/23/18  8:16 PM  Result Value Ref Range   Glucose-Capillary 221 (H) 70 - 99 mg/dL  Glucose, capillary     Status: Abnormal   Collection Time: 07/23/18 11:56 PM  Result Value Ref Range   Glucose-Capillary 221 (H) 70 - 99 mg/dL  Glucose, capillary     Status: Abnormal   Collection Time: 07/24/18  4:15 AM  Result Value Ref Range   Glucose-Capillary 203 (H) 70 - 99 mg/dL  Basic metabolic panel     Status: Abnormal   Collection Time: 07/24/18  4:20 AM  Result Value Ref Range   Sodium 151 (H) 135 - 145 mmol/L   Potassium 4.3 3.5 - 5.1 mmol/L   Chloride 124 (H) 98 - 111 mmol/L   CO2 16 (L) 22 - 32 mmol/L   Glucose, Bld 249 (H) 70 - 99 mg/dL   BUN 84 (H) 8 - 23 mg/dL   Creatinine, Ser 3.80 (H) 0.61 - 1.24 mg/dL   Calcium 9.5 8.9 - 10.3 mg/dL   GFR calc non Af Amer 16 (L) >60 mL/min   GFR calc Af Amer 18 (L) >60 mL/min    Comment: (NOTE) The eGFR has been calculated using the CKD EPI equation. This calculation has not been validated in all clinical situations. eGFR's persistently <60 mL/min signify  possible Chronic Kidney Disease.    Anion gap 11 5 - 15    Comment: Performed at [facility] [address] [place], [place] [address]  Magnesium     Status: None   Collection Time: 07/24/18  4:20 AM  Result Value Ref Range   Magnesium 2.0 1.7 - 2.4 mg/dL    Comment: Performed at [facility] [address] [place], [place] [address]  Phosphorus     Status: None   Collection Time: 07/24/18  4:20 AM  Result Value Ref Range  Phosphorus 4.2 2.5 - 4.6 mg/dL    Comment: Performed at [facility], [place] [address] [place], [place] [address]  Glucose, capillary     Status: Abnormal   Collection Time: 07/24/18  4:31 AM  Result Value Ref Range   Glucose-Capillary 222 (H) 70 - 99 mg/dL  Glucose, capillary     Status: Abnormal   Collection Time: 07/24/18  8:41 AM  Result Value Ref Range   Glucose-Capillary 250 (H) 70 - 99 mg/dL  CBC     Status: Abnormal   Collection Time: 07/24/18  8:50 AM  Result Value Ref Range   WBC 18.3 (H) 4.0 - 10.5 K/uL   RBC 3.07 (L) 4.22 - 5.81 MIL/uL   Hemoglobin 8.8 (L) 13.0 - 17.0 g/dL   HCT 28.7 (L) 39.0 - 52.0 %   MCV 93.5 80.0 - 100.0 fL   MCH 28.7 26.0 - 34.0 pg   MCHC 30.7 30.0 - 36.0 g/dL   RDW 15.2 11.5 - 15.5 %   Platelets 552 (H) 150 - 400 K/uL   nRBC 0.0 0.0 - 0.2 %    Comment: Performed at [facility], [place] [address] [place], [place] [address]  Glucose, capillary     Status: Abnormal   Collection Time: 07/24/18 12:12 PM  Result Value Ref Range   Glucose-Capillary 210 (H) 70 - 99 mg/dL  Glucose, capillary     Status: Abnormal   Collection Time: 07/24/18  1:41 PM  Result Value Ref Range   Glucose-Capillary 212 (H) 70 - 99 mg/dL  Glucose, capillary     Status: Abnormal   Collection Time: 07/24/18  5:13 PM  Result Value Ref Range   Glucose-Capillary 255 (H) 70 - 99 mg/dL  Glucose, capillary     Status: Abnormal   Collection Time: 07/24/18  8:11 PM  Result Value Ref Range   Glucose-Capillary 247 (H) 70 - 99  mg/dL  Glucose, capillary     Status: Abnormal   Collection Time: 07/24/18 11:46 PM  Result Value Ref Range   Glucose-Capillary 216 (H) 70 - 99 mg/dL  Glucose, capillary     Status: Abnormal   Collection Time: 07/25/18  4:04 AM  Result Value Ref Range   Glucose-Capillary 200 (H) 70 - 99 mg/dL  Ammonia     Status: None   Collection Time: 07/25/18  4:53 AM  Result Value Ref Range   Ammonia 23 9 - 35 umol/L    Comment: Performed at [facility], [place] [address] [place], [place] [address]  Comprehensive metabolic panel     Status: Abnormal   Collection Time: 07/25/18  5:00 AM  Result Value Ref Range   Sodium 151 (H) 135 - 145 mmol/L   Potassium 4.2 3.5 - 5.1 mmol/L   Chloride 128 (H) 98 - 111 mmol/L   CO2 13 (L) 22 - 32 mmol/L   Glucose, Bld 235 (H) 70 - 99 mg/dL   BUN 94 (H) 8 - 23 mg/dL   Creatinine, Ser 4.35 (H) 0.61 - 1.24 mg/dL   Calcium 9.7 8.9 - 10.3 mg/dL   Total Protein 8.9 (H) 6.5 - 8.1 g/dL   Albumin 2.0 (L) 3.5 - 5.0 g/dL   AST 138 (H) 15 - 41 U/L   ALT 105 (H) 0 - 44 U/L   Alkaline Phosphatase 119 38 - 126 U/L   Total Bilirubin 1.0 0.3 - 1.2 mg/dL   GFR calc non Af Amer 13 (L) >60 mL/min  GFR calc Af Amer 15 (L) >60 mL/min    Comment: (NOTE) The eGFR has been calculated using the CKD EPI equation. This calculation has not been validated in all clinical situations. eGFR's persistently <60 mL/min signify possible Chronic Kidney Disease.    Anion gap 10 5 - 15    Comment: Performed at [facility] [address] [place], [place] [address]  CBC     Status: Abnormal   Collection Time: 07/25/18  5:00 AM  Result Value Ref Range   WBC 18.6 (H) 4.0 - 10.5 K/uL   RBC 3.20 (L) 4.22 - 5.81 MIL/uL   Hemoglobin 8.9 (L) 13.0 - 17.0 g/dL   HCT 30.3 (L) 39.0 - 52.0 %   MCV 94.7 80.0 - 100.0 fL   MCH 27.8 26.0 - 34.0 pg   MCHC 29.4 (L) 30.0 - 36.0 g/dL   RDW 15.3 11.5 - 15.5 %   Platelets 551 (H) 150 - 400 K/uL   nRBC 0.0 0.0 - 0.2 %    Comment: Performed  at [facility] [address], [place], [place] [address]  Basic metabolic panel     Status: Abnormal   Collection Time: 07/25/18  5:00 AM  Result Value Ref Range   Sodium 151 (H) 135 - 145 mmol/L   Potassium 4.1 3.5 - 5.1 mmol/L   Chloride 127 (H) 98 - 111 mmol/L   CO2 13 (L) 22 - 32 mmol/L   Glucose, Bld 228 (H) 70 - 99 mg/dL   BUN 93 (H) 8 - 23 mg/dL   Creatinine, Ser 4.27 (H) 0.61 - 1.24 mg/dL   Calcium 9.7 8.9 - 10.3 mg/dL   GFR calc non Af Amer 14 (L) >60 mL/min   GFR calc Af Amer 16 (L) >60 mL/min    Comment: (NOTE) The eGFR has been calculated using the CKD EPI equation. This calculation has not been validated in all clinical situations. eGFR's persistently <60 mL/min signify possible Chronic Kidney Disease.    Anion gap 11 5 - 15    Comment: Performed at [facility] [address], [place], [place] [address]  Magnesium     Status: None   Collection Time: 07/25/18  5:00 AM  Result Value Ref Range   Magnesium 2.0 1.7 - 2.4 mg/dL    Comment: Performed at [facility], [place] [address] [place], [place] [address]  Phosphorus     Status: None   Collection Time: 07/25/18  5:00 AM  Result Value Ref Range   Phosphorus 3.5 2.5 - 4.6 mg/dL    Comment: Performed at [facility] [address] [place], [place] [address]  Vitamin B12     Status: None   Collection Time: 07/25/18  5:00 AM  Result Value Ref Range   Vitamin B-12 405 180 - 914 pg/mL    Comment: (NOTE) This assay is not validated for testing neonatal or myeloproliferative syndrome specimens for Vitamin B12 levels. Performed at [facility], [place] [address], [place], [place] [address]   Folate     Status: None   Collection Time: 07/25/18  5:00 AM  Result Value Ref Range   Folate 17.1 >5.9 ng/mL    Comment: Performed at [facility], [place] [address], [place], [place] [address]  Glucose, capillary     Status: Abnormal   Collection Time: 07/25/18  7:22 AM  Result Value Ref  Range   Glucose-Capillary 210 (H) 70 -  99 mg/dL  Glucose, capillary     Status: Abnormal   Collection Time: 07/25/18 12:22 PM  Result Value Ref Range   Glucose-Capillary 269 (H) 70 - 99 mg/dL     ROS:  Review of systems not obtained due to patient factors.  Physical Exam: Vitals:   07/25/18 1153 07/25/18 1200  BP:  (!) 145/89  Pulse: (!) 128   Resp: (!) 44   Temp: 98.8 F (37.1 C)   SpO2: (!) 87%      General: Patient is alert but not really tracking.  Did respond to pain but is not conversive HEENT: Pupils equal round reactive to light, extra ocular motions are intact Neck: Negative for JVD Heart: Tachycardic Lungs: Poor effort, decreased breath sounds at bases Abdomen: Obese, distended, right upper quadrant ostomy in place, 2 separate drains with clear drainage Extremities: No edema Skin: Warm and dry Neuro: Alert, not tracking not conversive  Assessment/Plan: 62 year old black male with recent diagnosis of metastatic colon cancer status post surgery with complications including intra-abdominal abscesses and wound dehiscence who also has developed AK I 1.Renal- patient with normal creatinine on 10/7.  It is worsened in the setting of intra-abdominal surgery, abscesses.  There is been no out right hemodynamic instability that I have seen and I also do not identify any nephrotoxins.  There does not appear to be obstruction on imaging.  I will order urinalysis to get more information.  He is nonoliguric and there are no absolute indications for dialysis at this time.  In addition, given his recent devastating diagnosis that  I do not have a prognosis for it may not be appropriate to initiate dialysis in the setting- I am actually thinking not.  I think is a good idea to get palliative care involved 2. Hypertension/volume  - appears euvolemic actually-however, with sodium elevated and has a water deficit. Is getting 1/4 NS but also with bicarb- not sure the tonicity of that  3.   Metabolic acidosis-is getting some semblance of a bicarb drip.  I will increase the rate 4. Anemia  -significant but situational.  Supportive care for now 5.  Hypernatremia-has free water deficit.  Is getting fluids that are low in saline but also bicarb.  Not sure the tonicity of that.  Will reevaluate sodium tomorrow and if it does not go down we will add on some extra [place] 07/25/2018, 1:51 PM

## 2018-07-25 NOTE — Consult Note (Signed)
[facility] Nurse ostomy follow up Stoma type/location: RUQ Stomal assessment/size: oval shaped, moist, red, barely above skin level Peristomal assessment:intact, had a lot of stool leakage surrounding ostomy.  Treatment options for stomal/peristomal skin: barrier ring Output brown liquid effluent Ostomy pouching: 2pc.2 3/4"  Education provided: none, pt unable to learn Enrolled patient in [employer] Discharge program: No Barrier rings ordered, 3 pouches and barriers and 1 barrier ring at bedside. WOC will continue to follow twice a week. Pt unable to learn, will be total care at discharge. Fara Olden, RN-C, WTA-C, [facility]ound Treatment Associate Ostomy Care Associate

## 2018-07-25 NOTE — Progress Notes (Signed)
Referring Physician(s): Dr. Thereasa Solo  Supervising Physician: Corrie Mckusick  Patient Status:  [facility] - In-pt  Chief Complaint: Follow-up perisplenic and hepatic abscess drains placed 10/18 by Dr. Barbie Banner  Subjective:  Patient laying in bed, food tray at bedside with minimal amount of food missing. Patient mumbles and laughs throughout exam.   Allergies: Aspirin  Medications: Prior to Admission medications   Medication Sig Start Date End Date Taking? Authorizing Provider  allopurinol (ZYLOPRIM) 100 MG tablet Take 100 mg by mouth daily. 06/14/18  Yes [provider]  enalapril (VASOTEC) 5 MG tablet Take 5 mg by mouth daily. 06/14/18  Yes [provider]  glimepiride (AMARYL) 2 MG tablet Take 2 mg by mouth 2 (two) times daily. 07/04/18  Yes [provider]  metFORMIN (GLUCOPHAGE) 1000 MG tablet Take 1,000 mg by mouth 2 (two) times daily. 06/14/18  Yes [provider]  pantoprazole (PROTONIX) 40 MG tablet Take 40 mg by mouth daily. 06/14/18  Yes [provider]  simvastatin (ZOCOR) 40 MG tablet Take 40 mg by mouth daily. 06/14/18  Yes [provider]     Vital Signs: BP 138/87   Pulse (!) 116   Temp (!) 100.5 F (38.1 C) (Oral)   Resp (!) 41   Ht 6' (1.829 m)   Wt (!) 316 lb (143.3 kg)   SpO2 100%   BMI 42.86 kg/m   Physical Exam  Constitutional: No distress.  Cardiovascular: Regular rhythm and normal heart sounds.  tachycardic  Pulmonary/Chest: Effort normal and breath sounds normal.  Abdominal: Soft. There is no tenderness.  Abdominal binder on. LUQ drain to JP with ~25 cc of tan output, insertion site clean, dry, intact without erythema or edema. RUQ drain to gravity with scant serous output; insertion site clean, dry, intact without erythema or edema. Colostomy present.  Neurological: He is alert.  Skin: Skin is warm and dry. He is not diaphoretic.  Nursing note and vitals reviewed.   Imaging: Dg Chest 1 View  Result  Date: 07/25/2018 CLINICAL DATA:  Tachypnea EXAM: CHEST  1 VIEW COMPARISON:  July 16, 2018 FINDINGS: Central catheter tip is in the superior vena cava. Previous right jugular catheter and nasogastric tube have been removed. No pneumothorax. There is hazy air base opacity in the left mid lower lung zones with small left pleural effusion. Right lung is clear. Heart is upper normal in size with pulmonary vascularity normal. No adenopathy. No bone lesions. There are drains in the upper abdominal regions. IMPRESSION: Hazy opacity in portions of the left mid lower lung zones, concerning for pneumonia, similar to prior study. Small left pleural effusion. Right lung clear. Stable cardiac silhouette. Central catheter tip in superior vena cava. No pneumothorax. Electronically Signed   By: Lowella Grip III M.D.   On: 07/25/2018 08:11   Mr Brain Wo Contrast  Result Date: 07/24/2018 CLINICAL DATA:  62 year old male with altered mental status. EXAM: MRI HEAD WITHOUT CONTRAST TECHNIQUE: Multiplanar, multiecho pulse sequences of the brain and surrounding structures were obtained without intravenous contrast. COMPARISON:  Head CT without contrast 07/21/2018 FINDINGS: Brain: No restricted diffusion to suggest acute infarction. No midline shift, mass effect, evidence of mass lesion, ventriculomegaly, extra-axial collection or acute intracranial hemorrhage. Cervicomedullary junction and pituitary are within normal limits. Questionable brainstem and cerebellar volume loss out of proportion to cerebral hemisphere volume loss. Scattered and patchy cerebral white matter T2 and FLAIR hyperintensity. Marginal involvement of the right lentiform nuclei. No cortical encephalomalacia or chronic cerebral  blood products identified. The other deep gray matter nuclei appear within normal limits. No brainstem or cerebellar signal abnormality identified. Vascular: Major intracranial vascular flow voids are preserved. Skull and upper  cervical spine: Negative visible cervical spine. Normal bone marrow signal. Sinuses/Orbits: Normal orbits soft tissues. Trace paranasal sinus mucosal. Other: Mastoids are clear. Grossly normal visible internal auditory structures appear normal. Scalp and face soft tissues appear negative. IMPRESSION: 1.  No acute intracranial abnormality. 2. Questionable disproportionate volume loss of the brainstem and cerebellum, nonspecific. 3. Mild to moderate for age nonspecific signal changes in the cerebral white matter, most commonly due to chronic small vessel disease. Electronically Signed   By: Genevie Ann M.D.   On: 07/24/2018 12:57    Labs:  CBC: Recent Labs    07/21/18 0352 07/23/18 0346 07/24/18 0850 07/25/18 0500  WBC 22.9* 20.3* 18.3* 18.6*  HGB 9.7* 8.9* 8.8* 8.9*  HCT 31.1* 29.5* 28.7* 30.3*  PLT 597* 599* 552* 551*    COAGS: Recent Labs    07/30/2018 0701 07/04/2018 0748 07/07/2018 1402 07/21/18 0352  INR 1.13  --  1.23 1.50  APTT  --  33  --   --     BMP: Recent Labs    07/22/18 0409 07/23/18 0346 07/24/18 0420 07/25/18 0500  NA 156* 153* 151* 151*  151*  K 5.0 4.5 4.3 4.2  4.1  CL 126* 129* 124* 128*  127*  CO2 19* 18* 16* 13*  13*  GLUCOSE 280* 267* 249* 235*  228*  BUN 99* 88* 84* 94*  93*  CALCIUM 9.1 9.1 9.5 9.7  9.7  CREATININE 4.12* 3.88* 3.80* 4.35*  4.27*  GFRNONAA 14* 15* 16* 13*  14*  GFRAA 16* 18* 18* 15*  16*    LIVER FUNCTION TESTS: Recent Labs    07/19/18 0009 07/21/18 0035 07/23/18 0346 07/25/18 0500  BILITOT 1.9* 1.4* 1.0 1.0  AST 137* 148* 94* 138*  ALT 94* 137* 114* 105*  ALKPHOS 119 128* 125 119  PROT 7.8 8.1 8.8* 8.9*  ALBUMIN 2.5* 2.4* 2.1* 2.0*    Assessment and Plan:  S/p perisplenic and hepatic abscess drains placed 10/18 by Dr. Barbie Banner - both drains with minimal output, left > right, cultures show NGTD. Worsening confusion, poor PO intake. Abdominal wound dehiscence followed by surgery.   WBC improved to 18.6 today however  patient tmax 100.7, tachycardic and tachypneic per chart - CXR shows possible PNA. Followed by primary team.  Continue TID flushes with 3-5 cc NS, record output of each drain daily.   IR will continue to follow.   Electronically Signed: Joaquim Nam, PA-C 07/25/2018, 10:58 AM   I spent a total of 15 Minutes at the the patient's bedside AND on the patient's hospital floor or unit, greater than 50% of which was counseling/coordinating care for perisplenic and hepatic abscess drains.

## 2018-07-25 NOTE — Progress Notes (Addendum)
Pt was transferred to 3MW.  Dr. Landis Gandy has spoken with pt's daughter who lives in [place].   On 10/23, I was concerned for and advocating for this patient all day. I will give a few highlights on how I was working for this patient.  After report, I went to the charge nurses and reported that this patient was tachypneac and was slurring his words. I was told he had been breathing fast all weekend and he was getting better. I told them I did not feel good about this patient and asked if they, Ginger especially would help keep an eye on him.   I reported tachycardia, fever, tachypnea, slurred words to Dr. Waldron Labs multiple times.  I requested an ABG and bipap and any other help to correct CO2, bicarb and mentation.  Iv fluids with sodium bicarb were ordered and administered.      Around 1 pm, I spoke with the surgeon who was in the patients room.  I informed him that the pt had been running a low grade fever, words were slurred, mentation was changing, tachypnea, tachycardia and I asked what else we could do.  Surgeon said to monitor him.  I asked about a CT scan.  The surgeon said an abscess wouldn't be formed until 10/24 so they would do a CT then.  Ginger called Magda Paganini, the rapid response nurse when I was at lunch.  Leslie paged ABG results to Elgergawy but did not get a call back. I paged him with the ABG results a second time.    Ginger and I spoke with Elgergawy in the dictation room and he told Ginger that he had been paged at least 10 times about this patient and maybe we did not know how to care for progressive patients.  An hour or two after this meeting, Elgergawy called critical care. Later in the afternoon, Elgergawy did contact critical care and pt was transferred.

## 2018-07-25 NOTE — Progress Notes (Signed)
Inpatient Diabetes Program Recommendations  AACE/ADA: New Consensus Statement on Inpatient Glycemic Control (2015)  Target Ranges:  Prepandial:   less than 140 mg/dL      Peak postprandial:   less than 180 mg/dL (1-2 hours)      Critically ill patients:  140 - 180 mg/dL   Lab Results  Component Value Date   GLUCAP 210 (H) 07/25/2018   HGBA1C 7.4 (H) 07/14/2018    Review of Glycemic Control Results for Jerry Green, Jerry Green (MRN [id number]) as of 07/25/2018 11:01  Ref. Range 07/24/2018 20:11 07/24/2018 23:46 07/25/2018 04:04 07/25/2018 07:22  Glucose-Capillary Latest Ref Range: 70 - 99 mg/dL 247 (H) 216 (H) 200 (H) 210 (H)   Diabetes history: Type 2 DM Outpatient Diabetes medications:  Current orders for Inpatient glycemic control: TPN to transition from 125 units today to 150 units, Lantus 35 units QD, Novolog 0-20 units Q4H  Inpatient Diabetes Program Recommendations:    Noted addition of insulin in TPN to 150 units/24h. May also consider increasing basal to Lantus 40 units QD.  Thanks, Bronson Curb, MSN, RNC-OB Diabetes Coordinator [phone] (8a-5p)

## 2018-07-25 NOTE — Procedures (Signed)
Intubation Procedure Note Zadrian Mccauley [id number] [birth year]  Procedure: Intubation Indications: Respiratory insufficiency  Procedure Details Consent: Risks of procedure as well as the alternatives and risks of each were explained to the (patient/caregiver).  Consent for procedure obtained.verbal informed consent from daugther over phone Time Out: Verified patient identification, verified procedure, site/side was marked, verified correct patient position, special equipment/implants available, medications/allergies/relevent history reviewed, required imaging and test results available.  Performed  Maximum sterile technique was used including cap, gloves, gown, hand hygiene, mask and sheet.  MAC    Evaluation Hemodynamic Status: BP stable throughout; O2 sats: stable throughout Patient's Current Condition: stable Complications: No apparent complications Patient did tolerate procedure well. Chest X-ray ordered to verify placement.  CXR: pending.   Brand Males 07/25/2018

## 2018-07-25 NOTE — Progress Notes (Signed)
Pharmacy Antibiotic Note  Jerry Green is a 62 y.o. male admitted on 07/21/2018 with RLQ pain and was found to have perforated colon CA of splenic fracture, now s/p surgery on 93/7 complicated by fluid collections post-op. Noted drains placed 10/18. The patient continues with persistent fevers and elevated WBC. Pharmacy has been consulted for Daptomycin dosing.  The patient is in AKI with SCr 4.35 (BL 1.06 on 10/8), CrCl<30 ml/min. Will continue Dapto dosing at 8 mg/kg given severe illness and to ensure optimal coverage for Enterococcus.  Baseline CK 61  Plan: - Continue Daptomycin 850 mg (~8 mg/kg) every 48 hours - Weekly CK - Will continue to follow renal function, culture results, LOT, and antibiotic de-escalation plans   Height: 6' (182.9 cm) Weight: (!) 316 lb (143.3 kg) IBW/kg (Calculated) : 77.6  Adjusted BW 104 kg  Temp (24hrs), Avg:100.5 F (38.1 C), Min:100.5 F (38.1 C), Max:100.5 F (38.1 C)  Recent Labs  Lab 07/21/18 0035 07/21/18 0352 07/22/18 0409 07/23/18 0346 07/24/18 0420 07/24/18 0850 07/25/18 0500  WBC 23.3* 22.9*  --  20.3*  --  18.3* 18.6*  CREATININE 4.33* 4.19* 4.12* 3.88* 3.80*  --  4.35*  4.27*    Estimated Creatinine Clearance: 25.5 mL/min (A) (by C-G formula based on SCr of 4.35 mg/dL (H)).    Allergies  Allergen Reactions  . Aspirin Nausea Only    Antimicrobials this admission: Zosyn 10/9 > Dapto 10/20 >> Eraxis 10/20 >>  Dose adjustments this admission: n/a  Microbiology results: 10/8 MRSA  PCR : neg 10/18: Liver cx >> pending 10/19 BCx >> ngtd 10/20 BCx >>  Bartley Vuolo A. Levada Dy, PharmD, [place] Pager: [phone] Please utilize Amion for appropriate phone number to reach the unit pharmacist ([place])   07/25/2018 11:00 AM

## 2018-07-25 NOTE — Progress Notes (Signed)
Physical Therapy Treatment Patient Details Name: Jerry Green MRN: [id number] DOB: 1956/04/09 Today's Date: 07/25/2018    History of Present Illness  Jerry Green is a 62 y.o. male with medical history significant of HTN, DM type 2, and Gerd; who initially presented to [facility] with complaints of progressively worsening right lower quadrant abdominal pain over the last 3-4 days. 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  Taken to OR 10/9 for ex lap and liver biopsy s/p colectomy and open abd with wound vac.    PT Comments    Session more limited today due to extreme confusion.  Emphasis on transition to sitting EOB, balance EOB, and transfer to chair.  Pt not safe to work on standing today due to lack of focus.    Follow Up Recommendations  CIR     Equipment Recommendations  None recommended by PT    Recommendations for Other Services       Precautions / Restrictions Precautions Precautions: Fall    Mobility  Bed Mobility Overal bed mobility: Needs Assistance Bed Mobility: Supine to Sit     Supine to sit: Max assist;+2 for physical assistance     General bed mobility comments: maximal/repetitive cues for direction and sequencing and significant truncal assist to come forward.  Transfers Overall transfer level: Needs assistance   Transfers: Sit to/from Stand;Stand Pivot Transfers Sit to Stand: Max assist;+2 physical assistance;+2 safety/equipment;Total assist Stand pivot transfers: +2 physical assistance;Max assist       General transfer comment: Pt unable to follow instruction today.  Sit to stand more difficult due to pt unable to focus  Ambulation/Gait             General Gait Details: pivot transfer  only   Stairs             Wheelchair Mobility    Modified Rankin (Stroke Patients Only)       Balance Overall balance assessment: Needs assistance   Sitting balance-Leahy Scale: Poor Sitting balance - Comments: pt with heavy lean posteriorly     Standing balance-Leahy Scale: Zero Standing balance comment: dependent on RW and physical assist.  listing posteriorly                            Cognition Arousal/Alertness: Awake/alert Behavior During Therapy: WFL for tasks assessed/performed Overall Cognitive Status: Within Functional Limits for tasks assessed Area of Impairment: Attention;Following commands;Problem solving                   Current Attention Level: Sustained   Following Commands: Follows one step commands with increased time     Problem Solving: Slow processing;Decreased initiation;Requires verbal cues;Requires tactile cues;Difficulty sequencing General Comments: Even more confused, not making coherent statements      Exercises Other Exercises Other Exercises: bil hip/knee flexion/ext with graded resistance x5 reps    General Comments        Pertinent Vitals/Pain Pain Assessment: Faces Faces Pain Scale: No hurt    Home Living                      Prior Function            PT Goals (current goals can now  be found in the care plan section) Acute Rehab PT Goals Patient Stated Goal: get stronger PT Goal Formulation: With patient Time For Goal Achievement: 07/30/18 Potential to Achieve Goals: Good    Frequency    Min 4X/week      PT Plan Current plan remains appropriate    Co-evaluation              AM-PAC PT "6 Clicks" Daily Activity  Outcome Measure  Difficulty turning over in bed (including adjusting bedclothes, sheets and blankets)?: Unable Difficulty moving from lying on back to sitting on the side of the bed? : Unable Difficulty sitting down on and standing up from a chair with arms  (e.g., wheelchair, bedside commode, etc,.)?: Unable Help needed moving to and from a bed to chair (including a wheelchair)?: A Lot Help needed walking in hospital room?: Total Help needed climbing 3-5 steps with a railing? : Total 6 Click Score: 7    End of Session   Activity Tolerance: Patient tolerated treatment well Patient left: in chair;with call bell/phone within reach;with chair alarm set Nurse Communication: Mobility status PT Visit Diagnosis: Unsteadiness on feet (R26.81);Other abnormalities of gait and mobility (R26.89);Difficulty in walking, not elsewhere classified (R26.2)     Time: 2409-7353 PT Time Calculation (min) (ACUTE ONLY): 20 min  Charges:  $Therapeutic Activity: 8-22 mins                     07/25/2018  Donnella Sham, PT Acute Rehabilitation Services [phone]  (pager) [phone]  (office)   Tessie Fass Susanne Baumgarner 07/25/2018, 1:45 PM

## 2018-07-26 ENCOUNTER — Inpatient Hospital Stay (HOSPITAL_COMMUNITY): Payer: Medicaid - Out of State

## 2018-07-26 DIAGNOSIS — R509 Fever, unspecified: Secondary | ICD-10-CM

## 2018-07-26 DIAGNOSIS — Z960 Presence of urogenital implants: Secondary | ICD-10-CM

## 2018-07-26 DIAGNOSIS — C800 Disseminated malignant neoplasm, unspecified: Secondary | ICD-10-CM

## 2018-07-26 DIAGNOSIS — Z9911 Dependence on respirator [ventilator] status: Secondary | ICD-10-CM

## 2018-07-26 DIAGNOSIS — K659 Peritonitis, unspecified: Secondary | ICD-10-CM

## 2018-07-26 DIAGNOSIS — R918 Other nonspecific abnormal finding of lung field: Secondary | ICD-10-CM

## 2018-07-26 DIAGNOSIS — E119 Type 2 diabetes mellitus without complications: Secondary | ICD-10-CM

## 2018-07-26 DIAGNOSIS — C778 Secondary and unspecified malignant neoplasm of lymph nodes of multiple regions: Secondary | ICD-10-CM

## 2018-07-26 DIAGNOSIS — Z886 Allergy status to analgesic agent status: Secondary | ICD-10-CM

## 2018-07-26 DIAGNOSIS — Z9049 Acquired absence of other specified parts of digestive tract: Secondary | ICD-10-CM

## 2018-07-26 DIAGNOSIS — Z933 Colostomy status: Secondary | ICD-10-CM

## 2018-07-26 LAB — GLUCOSE, CAPILLARY
GLUCOSE-CAPILLARY: 155 mg/dL — AB (ref 70–99)
Glucose-Capillary: 233 mg/dL — ABNORMAL HIGH (ref 70–99)
Glucose-Capillary: 292 mg/dL — ABNORMAL HIGH (ref 70–99)
Glucose-Capillary: 231 mg/dL — ABNORMAL HIGH (ref 70–99)
Glucose-Capillary: 191 mg/dL — ABNORMAL HIGH (ref 70–99)
Glucose-Capillary: 119 mg/dL — ABNORMAL HIGH (ref 70–99)

## 2018-07-26 LAB — BLOOD GAS, ARTERIAL
ACID-BASE DEFICIT: 10.1 mmol/L — AB (ref 0.0–2.0)
Bicarbonate: 15.2 mmol/L — ABNORMAL LOW (ref 20.0–28.0)
Drawn by: 51133
FIO2: 50
O2 Saturation: 98.5 %
PCO2 ART: 34.9 mmHg (ref 32.0–48.0)
PEEP: 5 cmH2O
Patient temperature: 100.8
RATE: 15 resp/min
VT: 620 mL
pH, Arterial: 7.27 — ABNORMAL LOW (ref 7.350–7.450)
pO2, Arterial: 155 mmHg — ABNORMAL HIGH (ref 83.0–108.0)

## 2018-07-26 LAB — URINALYSIS, ROUTINE W REFLEX MICROSCOPIC
Bilirubin Urine: NEGATIVE
Bilirubin Urine: NEGATIVE
GLUCOSE, UA: NEGATIVE mg/dL
Glucose, UA: NEGATIVE mg/dL
Ketones, ur: NEGATIVE mg/dL
Ketones, ur: NEGATIVE mg/dL
LEUKOCYTES UA: NEGATIVE
NITRITE: NEGATIVE
Nitrite: NEGATIVE
PH: 5 (ref 5.0–8.0)
PROTEIN: 100 mg/dL — AB
Protein, ur: 30 mg/dL — AB
Specific Gravity, Urine: 1.017 (ref 1.005–1.030)
Specific Gravity, Urine: 1.017 (ref 1.005–1.030)
pH: 5 (ref 5.0–8.0)

## 2018-07-26 LAB — CBC WITH DIFFERENTIAL/PLATELET
Abs Immature Granulocytes: 0.18 10*3/uL — ABNORMAL HIGH (ref 0.00–0.07)
BASOS ABS: 0.1 10*3/uL (ref 0.0–0.1)
Basophils Relative: 1 %
EOS ABS: 0.4 10*3/uL (ref 0.0–0.5)
Eosinophils Relative: 3 %
HCT: 26.4 % — ABNORMAL LOW (ref 39.0–52.0)
Hemoglobin: 7.7 g/dL — ABNORMAL LOW (ref 13.0–17.0)
IMMATURE GRANULOCYTES: 1 %
LYMPHS ABS: 2.1 10*3/uL (ref 0.7–4.0)
Lymphocytes Relative: 12 %
MCH: 27.9 pg (ref 26.0–34.0)
MCHC: 29.2 g/dL — ABNORMAL LOW (ref 30.0–36.0)
MCV: 95.7 fL (ref 80.0–100.0)
Monocytes Absolute: 1.2 10*3/uL — ABNORMAL HIGH (ref 0.1–1.0)
Monocytes Relative: 7 %
NRBC: 0.1 % (ref 0.0–0.2)
Neutro Abs: 12.9 10*3/uL — ABNORMAL HIGH (ref 1.7–7.7)
Neutrophils Relative %: 76 %
PLATELETS: 462 10*3/uL — AB (ref 150–400)
RBC: 2.76 MIL/uL — ABNORMAL LOW (ref 4.22–5.81)
RDW: 15.3 % (ref 11.5–15.5)
WBC: 16.9 10*3/uL — ABNORMAL HIGH (ref 4.0–10.5)

## 2018-07-26 LAB — BASIC METABOLIC PANEL
Anion gap: 8 (ref 5–15)
BUN: 98 mg/dL — ABNORMAL HIGH (ref 8–23)
CHLORIDE: 125 mmol/L — AB (ref 98–111)
CO2: 15 mmol/L — AB (ref 22–32)
Calcium: 8.7 mg/dL — ABNORMAL LOW (ref 8.9–10.3)
Creatinine, Ser: 4.61 mg/dL — ABNORMAL HIGH (ref 0.61–1.24)
GFR calc Af Amer: 14 mL/min — ABNORMAL LOW (ref >60)
GFR calc non Af Amer: 12 mL/min — ABNORMAL LOW (ref >60)
Glucose, Bld: 248 mg/dL — ABNORMAL HIGH (ref 70–99)
POTASSIUM: 3.9 mmol/L (ref 3.5–5.1)
SODIUM: 148 mmol/L — AB (ref 135–145)

## 2018-07-26 LAB — AEROBIC/ANAEROBIC CULTURE W GRAM STAIN (SURGICAL/DEEP WOUND): Culture: NO GROWTH

## 2018-07-26 LAB — IRON AND TIBC
Iron: 11 ug/dL — ABNORMAL LOW (ref 45–182)
SATURATION RATIOS: 6 % — AB (ref 17.9–39.5)
TIBC: 195 ug/dL — ABNORMAL LOW (ref 250–450)
UIBC: 184 ug/dL

## 2018-07-26 LAB — CULTURE, BLOOD (ROUTINE X 2)
Culture: NO GROWTH
Culture: NO GROWTH
Special Requests: ADEQUATE

## 2018-07-26 LAB — AEROBIC/ANAEROBIC CULTURE (SURGICAL/DEEP WOUND)

## 2018-07-26 LAB — MAGNESIUM: MAGNESIUM: 1.7 mg/dL (ref 1.7–2.4)

## 2018-07-26 LAB — PROCALCITONIN: Procalcitonin: 1.69 ng/mL

## 2018-07-26 LAB — FERRITIN: Ferritin: 370 ng/mL — ABNORMAL HIGH (ref 24–336)

## 2018-07-26 LAB — PHOSPHORUS: Phosphorus: 4.5 mg/dL (ref 2.5–4.6)

## 2018-07-26 MED ORDER — MIDAZOLAM HCL 2 MG/2ML IJ SOLN
2.0000 mg | INTRAMUSCULAR | Status: DC | PRN
  Administered 2018-07-26 – 2018-07-28 (×2): 2 mg via INTRAVENOUS
  Filled 2018-07-26 (×4): qty 2

## 2018-07-26 MED ORDER — SODIUM CHLORIDE 0.9 % IV SOLN
INTRAVENOUS | Status: DC | PRN
  Administered 2018-07-26 – 2018-08-01 (×4): 250 mL via INTRAVENOUS

## 2018-07-26 MED ORDER — INSULIN ASPART 100 UNIT/ML ~~LOC~~ SOLN
3.0000 [IU] | SUBCUTANEOUS | Status: DC
  Administered 2018-07-26: 6 [IU] via SUBCUTANEOUS
  Administered 2018-07-26: 9 [IU] via SUBCUTANEOUS
  Administered 2018-07-26: 6 [IU] via SUBCUTANEOUS
  Administered 2018-07-27: 3 [IU] via SUBCUTANEOUS
  Administered 2018-07-27: 6 [IU] via SUBCUTANEOUS
  Administered 2018-07-28 (×2): 3 [IU] via SUBCUTANEOUS
  Administered 2018-07-28: 9 [IU] via SUBCUTANEOUS
  Administered 2018-07-28: 3 [IU] via SUBCUTANEOUS
  Administered 2018-07-28 – 2018-07-29 (×6): 6 [IU] via SUBCUTANEOUS

## 2018-07-26 MED ORDER — FENTANYL 2500MCG IN NS 250ML (10MCG/ML) PREMIX INFUSION
25.0000 ug/h | INTRAVENOUS | Status: DC
  Administered 2018-07-26: 400 ug/h via INTRAVENOUS
  Administered 2018-07-27: 300 ug/h via INTRAVENOUS
  Administered 2018-07-27: 350 ug/h via INTRAVENOUS
  Administered 2018-07-27 – 2018-07-28 (×3): 400 ug/h via INTRAVENOUS
  Administered 2018-07-28: 350 ug/h via INTRAVENOUS
  Administered 2018-07-28 – 2018-07-29 (×2): 400 ug/h via INTRAVENOUS
  Filled 2018-07-26 (×9): qty 250

## 2018-07-26 MED ORDER — INSULIN ASPART 100 UNIT/ML IV SOLN
8.0000 [IU] | Freq: Once | INTRAVENOUS | Status: DC
  Filled 2018-07-26: qty 0.08

## 2018-07-26 MED ORDER — FREE WATER
300.0000 mL | Freq: Four times a day (QID) | Status: DC
  Administered 2018-07-26 – 2018-07-29 (×10): 300 mL

## 2018-07-26 MED ORDER — DEXMEDETOMIDINE HCL IN NACL 400 MCG/100ML IV SOLN
0.0000 ug/kg/h | INTRAVENOUS | Status: DC
  Administered 2018-07-26 – 2018-07-27 (×2): 0.4 ug/kg/h via INTRAVENOUS
  Administered 2018-07-27: 0.8 ug/kg/h via INTRAVENOUS
  Administered 2018-07-27: 0.4 ug/kg/h via INTRAVENOUS
  Administered 2018-07-27: 0.2 ug/kg/h via INTRAVENOUS
  Administered 2018-07-28: 0.8 ug/kg/h via INTRAVENOUS
  Administered 2018-07-28 (×2): 1 ug/kg/h via INTRAVENOUS
  Administered 2018-07-28: 1.2 ug/kg/h via INTRAVENOUS
  Administered 2018-07-28: 0.8 ug/kg/h via INTRAVENOUS
  Administered 2018-07-28: 1.2 ug/kg/h via INTRAVENOUS
  Administered 2018-07-28: 0.6 ug/kg/h via INTRAVENOUS
  Administered 2018-07-28: 1 ug/kg/h via INTRAVENOUS
  Administered 2018-07-29 (×4): 1.2 ug/kg/h via INTRAVENOUS
  Filled 2018-07-26 (×3): qty 100
  Filled 2018-07-26: qty 200
  Filled 2018-07-26 (×13): qty 100

## 2018-07-26 MED ORDER — TRAVASOL 10 % IV SOLN
INTRAVENOUS | Status: DC
  Administered 2018-07-26: 18:00:00 via INTRAVENOUS
  Filled 2018-07-26: qty 1344

## 2018-07-26 NOTE — Progress Notes (Signed)
Rt attempted to get sputum sample per order but pt biting ett. RN to give increased sedation medication to help. Will attempt to obtain later

## 2018-07-26 NOTE — Progress Notes (Signed)
Pt continues to bite ett but RT able to obtain sputum sample per MD order and sent to lab. RN aware

## 2018-07-26 NOTE — Consult Note (Addendum)
[facility] for Infectious Disease    Date of Admission:  07/08/2018     Total days of antibiotics 15                Reason for Consult: Fevers / Leukocytosis   Referring Provider: Lake Bells Primary Care Provider: Ollen Bowl, MD   Assessment/Plan:   Mr. Harmening is a 62 year old male presenting with perforated colon cancer of the splenic flexure and is s/p partial colon resection with colostomy and exploratory laporotomy. Surgical pathology shows invasive adenocarcinoma also in 3 of 17 lymph nodes. During surgery spillage of stool was noted. Experienced increase in leukocytosis ultimately finding 3 fluid collections with drains placed by IR. He has had continued leukocytosis throughout. Mentation has been decreased and now requiring intubation for respiratory support and protection. He is on Day 4 of Daptomycin and Anidulofungin and at least Day 16 of piperacillin-tazobactam. Blood cultures from 10/19 are without growth to date. New CT scan with bilateral pulmonary nodules likely metastases with persistent intrahepatic abscess. In the abdomen there is a possible fluid collection in the pelvis versus distended segment of fluid containing bowel. Unfortunately there is nothing growing on cultures presently. His fevers may be related to his adenocarcinoma. It is positive that his WBC count is trending downward. Would request if IR were to reposition drains that new cultures be obtained given recent worsening.    1. Continue current dose of daptomycin, piperacillin-tazobactam and eraxis for broad spectrum coverage.  2. Colon cancer treatment per radiology. 3. Wound care per General surgery.  4. Check respiratory culture.   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 chlorhexidine gluconate (MEDLINE KIT)  15 mL Mouth Rinse BID  . fentaNYL (SUBLIMAZE) injection  100 mcg Intravenous Once  . fentaNYL (SUBLIMAZE) injection  50 mcg Intravenous Once  . free water  200 mL Per Tube Q8H  . heparin injection (subcutaneous)  5,000 Units Subcutaneous Q8H  . insulin aspart  3-9 Units Subcutaneous Q4H  . insulin aspart  8 Units Intravenous Once  . insulin glargine  40 Units Subcutaneous Daily  . mouth rinse  15 mL Mouth Rinse 10 times per day  . sodium bicarbonate  100 mEq Intravenous Once  . sodium chloride flush  5 mL Intracatheter Q8H     HPI: Nafis Farnan is a 62 y.o. male with previous medical history of hypertension, diabetes, and acid reflux who initially presented to outside hospital with complaints of progressive and worsening right lower quadrant abdominal pain over 3 to 4-day time.  Initially described as achy in nature with associated nausea, vomiting, and weight loss of approximately 10 pounds with decreased appetite.  He denies any significant fevers at that time.  His vital signs on admission to the outside hospital were temperature of 99.3, heart rate of 110, blood pressure 123/88 and O2 saturation 96% on room air.  White blood cell count was noted to be 12.9.  CT scan of the abdomen and pelvis revealed a perforated obstructing large carcinoma of the splenic flexure of the colon with lymphatic metastasis, peritoneal  carcinomatosis and liver metastasis.  He was started on vancomycin and piperacillin-tazobactam prior to transfer to [facility]. General surgery was consulted and he was brought to the operating room for exploratory laparotomy, left colectomy, and biopsy of a liver lesion.  Tumor was noted growing into his retroperitoneum as well as the sidewall near the  splenic flexure.  There was perforation and spillage of stool noted. A right upper quadrant colostomy was placed and followed by [place] nursing. Oncology was consulted for metastatic colon cancer.   Mr. Fancher developed worsening leukocytosis of 22.9 with follow up CT scan showing 3 fluid collections within the abdomen with peritonitis. IR was requested for aspiration and drainage. Liver cultures taken on 10/18 with no growth on culture. On 10/20 with continued leukocytosis and started on Daptomycin in addition to continued piperacillin-tazobactam with anidulofungin for fungal coverage. Now with acute renal failure with nephrology following. On 10/23 with worsening mentation requiring intubation.   Mr. Holness has been febrile in the last 24 hours with max temperature of 101.6. WBC count is currently 16.9 which is down slightly in the last 24 hours. He is on Day 5 of Daptomycin and Anidulofungin, and at Day 16 of piperacillin-tazobactam. Liver cultures remain with no growth to date and blood culture from 10/19 also remain without growth to date.     Review of Systems: Review of Systems  Unable to perform ROS: Intubated     Past Medical History:  Diagnosis Date  . Diabetes mellitus type II, controlled ([place])   . GERD (gastroesophageal reflux disease)   . HTN (hypertension)     Social History   Tobacco Use  . Smoking status: Never Smoker  . Smokeless tobacco: Never Used  Substance Use Topics  . Alcohol use: Not Currently    Frequency: Never  . Drug use: Never    Family History  Problem Relation Age of Onset  . Diabetes Maternal Grandmother     Allergies  Allergen Reactions  . Aspirin Nausea Only    OBJECTIVE: Blood pressure 124/72, pulse (!) 106, temperature (!) 100.6 F (38.1 C), resp. rate (!) 21, height _0  (1.88 m), weight (!) 154.2 kg, SpO2 100 %.  Physical Exam  Constitutional: He appears well-developed. He is easily aroused. He has a sickly appearance. He appears ill.  No distress. He is sedated and intubated.  Cardiovascular: Regular rhythm, normal heart sounds and intact distal pulses. Tachycardia present.  Pulmonary/Chest: Effort normal and breath sounds normal. He is intubated.  Abdominal:  Colostomy in right upper quadrant appears patent with small amount of liquid stool. Dressing is clean and dry. Surgical wound remains wet-to-dry dressing. There are 3 drains. On right with serosanguinous, and on the left the bulb with gray color discharge and the bag with yellow discharge.    Genitourinary:  Genitourinary Comments: Foley catheter present.  Neurological: He is easily aroused.  Skin: Skin is warm and dry.  Psychiatric: He has a normal mood and affect. His behavior is normal. Judgment and thought content normal.    Lab Results Lab Results  Component Value Date   WBC 16.9 (H) 07/26/2018   HGB 7.7 (L) 07/26/2018   HCT 26.4 (L) 07/26/2018   MCV 95.7 07/26/2018   PLT 462 (H) 07/26/2018    Lab Results  Component Value Date   CREATININE 4.61 (H) 07/26/2018   BUN 98 (H) 07/26/2018   NA 148 (H) 07/26/2018   K 3.9 07/26/2018   CL 125 (H) 07/26/2018   CO2 15 (  L) 07/26/2018    Lab Results  Component Value Date   ALT 105 (H) 07/25/2018   AST 138 (H) 07/25/2018   ALKPHOS 119 07/25/2018   BILITOT 1.0 07/25/2018     Microbiology: Recent Results (from the past 240 hour(s))  Aerobic/Anaerobic Culture (surgical/deep wound)     Status: None (Preliminary result)   Collection Time: 07/20/18  5:43 PM  Result Value Ref Range Status   Specimen Description LIVER  Final   Special Requests NONE  Final   Gram Stain   Final    RARE WBC PRESENT, PREDOMINANTLY PMN NO ORGANISMS SEEN    Culture   Final    NO GROWTH 5 DAYS NO ANAEROBES ISOLATED; CULTURE IN PROGRESS FOR 5 DAYS Performed at [facility], [place] [address] [place], [place] [address]    Report Status PENDING  Incomplete  Culture, blood (routine x 2)     Status: None (Preliminary result)    Collection Time: 07/21/18  8:43 AM  Result Value Ref Range Status   Specimen Description BLOOD LEFT ANTECUBITAL  Final   Special Requests   Final    BOTTLES DRAWN AEROBIC AND ANAEROBIC Blood Culture results may not be optimal due to an excessive volume of blood received in culture bottles   Culture   Final    NO GROWTH 4 DAYS Performed at [facility], [place] [address] [place], [place] [address]    Report Status PENDING  Incomplete  Culture, blood (routine x 2)     Status: None (Preliminary result)   Collection Time: 07/21/18  8:49 AM  Result Value Ref Range Status   Specimen Description BLOOD LEFT HAND  Final   Special Requests   Final    BOTTLES DRAWN AEROBIC ONLY Blood Culture adequate volume   Culture   Final    NO GROWTH 4 DAYS Performed at [facility], [place] [address], [place], [place] [address]    Report Status PENDING  Incomplete     Terri Piedra, NP [place] [place] [phone] Pager ------------------------ I have seen and examined mr [facility] with greg calone. The patient is a 62yo M with multiorgan failure due to abdominal perforation related to adenoca. He remains febrile with marked elevated leukocytosis that is thought to be multifactorial response to infection liekly. On exam, he remains sedated, on cooling blanket to help with fevers. Ostomy, Drains in place from [place]. Recommend to continue on broad spectrum abtx for now in addn to critical care management. 07/26/2018  9:16 AM

## 2018-07-26 NOTE — Significant Event (Signed)
Rapid Response Event Note While rounding Ginger RN requested to add Mr. Jerry Green to our radar list.  Overview:   Pt lying supine in reclining chair. He would open his eyes to repetitive stimulation but would not stay awake. Oriented to his name only. RR 30-40's shallow and labored, lungs clear with some wheezing, unable to assess posterior bases as pt not following commands and unable to roll without full assistance. 145/89, 100% 2L [place],HR 120-130's. Looking back through the chart pt's presentation today is consistent with the last two days. A new order for ABG after speaking with Dr. Waldron Labs myself. Katey RT called and at bedside for ABG    Follow up 1425 sent a text page to MD with ABG results ph 7.34/Co2 below reportable range/O2 97.0/ Bicarb 9.6 Lori and Ginger also notified of results. Requested they call me as soon as they hear from MD as I was another call.   Follow up 5797 Ginger RN, MD at bedside no decision made at this time. 25 Dr. Chase Caller at bedside, plan to transfer to ICU and intubate on arrival.  1835 pt transferred to 9m10 with RR, Cecille Rubin RN and [place] (if not transferred):  Waiting for results from ABG  Event Summary:   start time 1315 end time 1425    start time 1632 end time 1835 Re-evaluated twice through out the afternoon. Spoke with RN Cecille Rubin and RN Ginger several times.        Jerry Green

## 2018-07-26 NOTE — Progress Notes (Signed)
[place] Progress Note Patient Name: Jerry Green DOB: [birth year] MRN: [id number]   Date of Service  07/26/2018  HPI/Events of Note  Episodes of agitation, biting the ET tube, despite Fentanyl drip and prn Versed  eICU Interventions  Start Precedex drip.  Ongoing discussion regarding goals of care.     Intervention Category Minor Interventions: Agitation / anxiety - evaluation and management  [name] 07/26/2018, 7:44 PM

## 2018-07-26 NOTE — Progress Notes (Signed)
Nutrition Follow-up  DOCUMENTATION CODES:   Obesity unspecified  INTERVENTION:   Continue TPN per Pharmacy -Nutritional needs re-estimated with change in status; see below -current TPN meets 84% of re-estimated protein needs, 126% of re-estimated calorie needs  -Further reduction in dextrose, to better meet nutritional needs, will likely improve blood glucose management  Recommend trial of trickle TF as medically able: Vital AF 1.2 @ 20  NUTRITION DIAGNOSIS:   Inadequate oral intake related to poor appetite, nausea, vomiting as evidenced by per patient/family report.  Being addressed via TPN  GOAL:   Patient will meet greater than or equal to 90% of their needs  MONITOR:   Vent status, Labs, TF tolerance, Weight trends, I & O's, Skin(TPN tolerance)  REASON FOR ASSESSMENT:   Consult, Ventilator Enteral/tube feeding initiation and management  ASSESSMENT:   62 year old male who presented with abdominal pain. PMH significant for hypertension, type 2 diabetes mellitus, GERD. 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  10/9 - s/p ex lap, liver biopsy, colectomy with open abdomen and wound VAC 10/11 - start trickle feeds Vital HP @ 20 ml/hr, pt vomited, TF stopped 10/13 - extubated, TPNstarted 10/16 - TPN increased to 65 ml/hr 10/17 - TPNincreased to goal rate of 100 ml/hr, NGT pulled out and replaced 10/18- Perisplenic and hepatic abscess drains placed by IR; NGT pulled out 10/22- diet advanced to clears  10/23 Transfer to ICU, Intubated  Nephrology following, noted possibility of short term HD/CVVHD Patient is currently intubated on ventilator support MV: 13.8 L/min Temp (24hrs), Avg:101 F (38.3 C), Min:98.8 F (37.1 C), Max:102.9 F (39.4 C)  Propofol: n/a  TPN @ 100 ml/hr: Dextrose has been reduced by pharmacy,   provides 2270 kcals, 134 g of protein; all lytes removed. Max insulin in TPN, CBGs >180  Current TPN exceeds re-estimated calorie needs and does not meet new protein needs; will discuss with Pharmacist  Free water deficit, sodium 148; Noted free water flushes of 300 mL q 6 hours ordered via OG tube today  Utilizing 111 kg as EDW at present; noted weight up significantly over the last few days. +1 edema present, possibility of change in bed scale with move to ICU. Pt previously reported UBW around 120-125 kg (265-275 pounds) with reported weight loss PTA. BMI >30  Labs: CBGs 231-292, sodium 148, BUN 98, Creatinine 4.61 Meds: sodium bicarbonate @ 125 ml/hr, ss novolog, lantus  Diet Order:   Diet Order            Diet NPO time specified  Diet effective now              EDUCATION NEEDS:   No education needs have been identified at this time  Skin:  Skin Assessment: Skin Integrity Issues: Skin Integrity Issues:: Other (Comment) Wound Vac: removed Other: abdominal wound dehiscence, colostomy, perisplenic and hepatic drains  Last BM:  10/24 small amount via colostomy  Height:   Ht Readings from Last 1 Encounters:  07/25/18 6\' 2"  (1.88 m)    Weight:   Wt Readings from Last 1 Encounters:  07/26/18 (!) 154.2 kg    Ideal Body Weight:  80.91 kg  BMI:  Body mass index is 43.65 kg/m.  Estimated Nutritional Needs:   Kcal:  1555-1805 kcals   Protein:  160-200 g  Fluid:  >/= 2 L/day  Kerman Passey MS, RD, LDN, CNSC (  [phone] Pager  [phone] Weekend/On-Call Pager

## 2018-07-26 NOTE — Progress Notes (Signed)
Patient ID: Jerry Green, male   DOB: Feb 23, 1956, 62 y.o.   MRN: [id number]    15 Days Post-Op  Subjective: Patient now in unit secondary to acidosis and respiratory failure.  Objective: Vital signs in last 24 hours: Temp:  [98.8 F (37.1 C)-101.7 F (38.7 C)] 101.7 F (38.7 C) (10/24 1108) Pulse Rate:  [101-126] 118 (10/24 1108) Resp:  [15-42] 20 (10/24 1108) BP: (100-141)/(64-93) 118/75 (10/24 1108) SpO2:  [100 %] 100 % (10/24 1108) FiO2 (%):  [30 %-100 %] 30 % (10/24 1108) Weight:  [154.2 kg] 154.2 kg (10/24 0410) Last BM Date: 07/23/18  Intake/Output from previous day: 10/23 0701 - 10/24 0700 In: 4951.7 [P.O.:120; I.V.:4681.8; IV Piggyback:99.9] Out: 2720 [Urine:2340; Emesis/NG output:250; Drains:55; Stool:75] Intake/Output this shift: Total I/O In: 1075.4 [I.V.:795.7; Other:10; IV Piggyback:269.8] Out: 390 [Urine:340; Stool:50]  PE: Abd: wound is stable.  mepitel in place.  Drains with almost no output currently.  Ostomy with some stool staining on the bag.  Lab Results:  Recent Labs    07/25/18 0500 07/26/18 0335  WBC 18.6* 16.9*  HGB 8.9* 7.7*  HCT 30.3* 26.4*  PLT 551* 462*   BMET Recent Labs    07/25/18 2240 07/26/18 0335  NA 147* 148*  K 4.3 3.9  CL 125* 125*  CO2 12* 15*  GLUCOSE 285* 248*  BUN 99* 98*  CREATININE 4.73* 4.61*  CALCIUM 9.0 8.7*   PT/INR No results for input(s): LABPROT, INR in the last 72 hours. CMP     Component Value Date/Time   NA 148 (H) 07/26/2018 0335   K 3.9 07/26/2018 0335   CL 125 (H) 07/26/2018 0335   CO2 15 (L) 07/26/2018 0335   GLUCOSE 248 (H) 07/26/2018 0335   BUN 98 (H) 07/26/2018 0335   CREATININE 4.61 (H) 07/26/2018 0335   CALCIUM 8.7 (L) 07/26/2018 0335   PROT 8.9 (H) 07/25/2018 0500   ALBUMIN 2.0 (L) 07/25/2018 0500   AST 138 (H) 07/25/2018 0500   ALT 105 (H) 07/25/2018 0500   ALKPHOS 119 07/25/2018 0500   BILITOT 1.0 07/25/2018 0500   GFRNONAA 12 (L) 07/26/2018 0335   GFRAA 14 (L) 07/26/2018  0335   Lipase  No results found for: LIPASE     Studies/Results: Ct Abdomen Pelvis Wo Contrast  Result Date: 07/26/2018 CLINICAL DATA:  Sepsis, perforated cancer of the splenic flexure of the colon post hemicolectomy, postoperative abscesses EXAM: CT CHEST, ABDOMEN AND PELVIS WITHOUT CONTRAST TECHNIQUE: Multidetector CT imaging of the chest, abdomen and pelvis was performed following the standard protocol without IV contrast. Neither oral nor intravenous contrast were administered. Sagittal and coronal MPR images reconstructed from axial data set. COMPARISON:  CT abdomen and pelvis 07/19/2018 FINDINGS: CT CHEST FINDINGS Cardiovascular: Minimal atherosclerotic calcifications aorta and coronary arteries. Aorta normal caliber. No pericardial effusion. Mediastinum/Nodes: Tip of endotracheal tube above carina. Tip of RIGHT arm PICC line at cavoatrial junction. Nasogastric tube traverses esophagus, esophagus otherwise unremarkable. No mediastinal adenopathy. Base of cervical region normal appearance. Lungs/Pleura: LEFT pleural effusion with compressive atelectasis of the posterior LEFT lung. Dependent atelectasis RIGHT lower lobe. RIGHT upper lobe pulmonary nodule 3 mm diameter image 49. RIGHT upper lobe pulmonary nodule 4 mm diameter image 29. Additional small nodule in RIGHT lower lobe adjacent to major fissure 5 mm diameter image 61. 6 mm LEFT upper lobe nodule image 48. No pneumothorax. Musculoskeletal: No acute osseous findings. CT ABDOMEN PELVIS FINDINGS Hepatobiliary: Beam hardening artifacts from arms traverse liver and spleen. Pigtail drainage catheter superiorly  in liver. Persistent air and fluid containing abscess collection at anterior liver, anterior to the pigtail, 4.6 x 4.3 x 4.0 cm. No definite additional liver lesions Pancreas: Normal appearance Spleen: Predominately obscured by beam hardening artifacts. Adjacent pigtail drainage catheter posterior to spleen. Adrenals/Urinary Tract: Adrenal  glands unremarkable. Kidneys grossly normal appearance. No definite hydronephrosis or hydroureter. Bladder decompressed by Foley catheter. Stomach/Bowel: Transverse colostomy RIGHT upper quadrant. Long Hartmann pouch to the level of the descending colon. Stomach decompressed. Small bowel loops unremarkable. Vascular/Lymphatic: Atherosclerotic calcifications aorta and iliac arteries. 12 mm LEFT para-aortic node image 178. Reproductive: Minimal prostatic enlargement Other: Question focal fluid collection in pelvis versus distended segment of fluid containing bowel 5.2 x 4.0 cm image 269. Small fluid collection at the LEFT gutter 3.0 x 3.0 cm image 199. No additional abscess collections. Scattered infiltrative changes of mesentery and omentum, which could be related to surgery and or inflammatory process. Open ventral wound. No hernia. Musculoskeletal: No acute osseous findings. IMPRESSION: BILATERAL pulmonary nodules likely metastases. Bibasilar atelectasis and small to moderate LEFT pleural effusion. Persistent intrahepatic abscess collection measuring 4.6 x 4.3 x 4.0 cm immediately anterior to the pigtail of the drainage catheter. Additional fluid collections at the LEFT gutter and potentially in pelvis, could be sterile or infected postoperative collections. Single enlarged LEFT para-aortic lymph node. Electronically Signed   By: Lavonia Dana M.D.   On: 07/26/2018 11:37   Dg Chest 1 View  Result Date: 07/25/2018 CLINICAL DATA:  Tachypnea EXAM: CHEST  1 VIEW COMPARISON:  July 16, 2018 FINDINGS: Central catheter tip is in the superior vena cava. Previous right jugular catheter and nasogastric tube have been removed. No pneumothorax. There is hazy air base opacity in the left mid lower lung zones with small left pleural effusion. Right lung is clear. Heart is upper normal in size with pulmonary vascularity normal. No adenopathy. No bone lesions. There are drains in the upper abdominal regions. IMPRESSION: Hazy  opacity in portions of the left mid lower lung zones, concerning for pneumonia, similar to prior study. Small left pleural effusion. Right lung clear. Stable cardiac silhouette. Central catheter tip in superior vena cava. No pneumothorax. Electronically Signed   By: Lowella Grip III M.D.   On: 07/25/2018 08:11   Ct Chest Wo Contrast  Result Date: 07/26/2018 CLINICAL DATA:  Sepsis, perforated cancer of the splenic flexure of the colon post hemicolectomy, postoperative abscesses EXAM: CT CHEST, ABDOMEN AND PELVIS WITHOUT CONTRAST TECHNIQUE: Multidetector CT imaging of the chest, abdomen and pelvis was performed following the standard protocol without IV contrast. Neither oral nor intravenous contrast were administered. Sagittal and coronal MPR images reconstructed from axial data set. COMPARISON:  CT abdomen and pelvis 07/19/2018 FINDINGS: CT CHEST FINDINGS Cardiovascular: Minimal atherosclerotic calcifications aorta and coronary arteries. Aorta normal caliber. No pericardial effusion. Mediastinum/Nodes: Tip of endotracheal tube above carina. Tip of RIGHT arm PICC line at cavoatrial junction. Nasogastric tube traverses esophagus, esophagus otherwise unremarkable. No mediastinal adenopathy. Base of cervical region normal appearance. Lungs/Pleura: LEFT pleural effusion with compressive atelectasis of the posterior LEFT lung. Dependent atelectasis RIGHT lower lobe. RIGHT upper lobe pulmonary nodule 3 mm diameter image 49. RIGHT upper lobe pulmonary nodule 4 mm diameter image 29. Additional small nodule in RIGHT lower lobe adjacent to major fissure 5 mm diameter image 61. 6 mm LEFT upper lobe nodule image 48. No pneumothorax. Musculoskeletal: No acute osseous findings. CT ABDOMEN PELVIS FINDINGS Hepatobiliary: Beam hardening artifacts from arms traverse liver and spleen. Pigtail drainage catheter superiorly  in liver. Persistent air and fluid containing abscess collection at anterior liver, anterior to the  pigtail, 4.6 x 4.3 x 4.0 cm. No definite additional liver lesions Pancreas: Normal appearance Spleen: Predominately obscured by beam hardening artifacts. Adjacent pigtail drainage catheter posterior to spleen. Adrenals/Urinary Tract: Adrenal glands unremarkable. Kidneys grossly normal appearance. No definite hydronephrosis or hydroureter. Bladder decompressed by Foley catheter. Stomach/Bowel: Transverse colostomy RIGHT upper quadrant. Long Hartmann pouch to the level of the descending colon. Stomach decompressed. Small bowel loops unremarkable. Vascular/Lymphatic: Atherosclerotic calcifications aorta and iliac arteries. 12 mm LEFT para-aortic node image 178. Reproductive: Minimal prostatic enlargement Other: Question focal fluid collection in pelvis versus distended segment of fluid containing bowel 5.2 x 4.0 cm image 269. Small fluid collection at the LEFT gutter 3.0 x 3.0 cm image 199. No additional abscess collections. Scattered infiltrative changes of mesentery and omentum, which could be related to surgery and or inflammatory process. Open ventral wound. No hernia. Musculoskeletal: No acute osseous findings. IMPRESSION: BILATERAL pulmonary nodules likely metastases. Bibasilar atelectasis and small to moderate LEFT pleural effusion. Persistent intrahepatic abscess collection measuring 4.6 x 4.3 x 4.0 cm immediately anterior to the pigtail of the drainage catheter. Additional fluid collections at the LEFT gutter and potentially in pelvis, could be sterile or infected postoperative collections. Single enlarged LEFT para-aortic lymph node. Electronically Signed   By: Lavonia Dana M.D.   On: 07/26/2018 11:37   Dg Chest Port 1 View  Result Date: 07/26/2018 CLINICAL DATA:  Respiratory failure. EXAM: PORTABLE CHEST 1 VIEW COMPARISON:  Radiograph of July 25, 2018. FINDINGS: Stable cardiomediastinal silhouette. Endotracheal and nasogastric tubes are unchanged in position. Right-sided PICC line is unchanged. No  pneumothorax is noted. Right lung is clear. Increased left basilar atelectasis or infiltrate is noted with associated pleural effusion. Bony thorax is unremarkable. IMPRESSION: Stable support apparatus. Increased left basilar atelectasis or infiltrate is noted with associated pleural effusion. Electronically Signed   By: Marijo Conception, M.D.   On: 07/26/2018 10:14   Dg Chest Port 1 View  Result Date: 07/25/2018 CLINICAL DATA:  CT and OG placement EXAM: PORTABLE CHEST 1 VIEW COMPARISON:  07/25/2018, 07/16/2018, 07/14/2018 FINDINGS: Endotracheal tube tip is about 2.5 cm superior to the carina. Right upper extremity catheter tip over the cavoatrial region. Esophageal tube tip in the left upper quadrant. Bilateral drainage catheters within the upper quadrants. Tiny left pleural effusion. Atelectasis versus minimal infiltrate at the medial left base. Stable cardiomediastinal silhouette. IMPRESSION: 1. Endotracheal tube tip about 2.5 cm superior to carina. Esophageal tube tip is in the left upper quadrant 2. Trace left pleural effusion with hazy atelectasis or infiltrate at the left base Electronically Signed   By: Donavan Foil M.D.   On: 07/25/2018 19:18    Anti-infectives: Anti-infectives (From admission, onward)   Start     Dose/Rate Route Frequency Ordered Stop   07/23/18 1500  anidulafungin (ERAXIS) 100 mg in sodium chloride 0.9 % 100 mL IVPB     100 mg 78 mL/hr over 100 Minutes Intravenous Every 24 hours 07/22/18 1420     07/22/18 2000  DAPTOmycin (CUBICIN) 850 mg in sodium chloride 0.9 % IVPB     850 mg 234 mL/hr over 30 Minutes Intravenous Every 48 hours 07/22/18 1459     07/22/18 1530  anidulafungin (ERAXIS) 200 mg in sodium chloride 0.9 % 200 mL IVPB     200 mg 78 mL/hr over 200 Minutes Intravenous  Once 07/22/18 1420 07/22/18 1941   07/10/2018 1800  piperacillin-tazobactam (ZOSYN) IVPB 3.375 g     3.375 g 12.5 mL/hr over 240 Minutes Intravenous Every 8 hours 07/27/2018 1444     07/24/2018  0915  cefoTEtan (CEFOTAN) 2 g in sodium chloride 0.9 % 100 mL IVPB     2 g 200 mL/hr over 30 Minutes Intravenous To Short Stay 07/16/2018 0857 08/02/2018 1900   07/29/2018 0906  cefoTEtan in Dextrose 5% (CEFOTAN) 2-2.08 GM-%(50ML) IVPB    Note to Pharmacy:  [name]   : cabinet override      07/31/2018 0906 07/22/2018 2114   07/06/2018 1200  piperacillin-tazobactam (ZOSYN) IVPB 3.375 g  Status:  Discontinued     3.375 g 12.5 mL/hr over 240 Minutes Intravenous Every 8 hours 07/18/2018 0615 07/03/2018 1341   07/07/2018 0730  cefoTEtan (CEFOTAN) 2 g in sodium chloride 0.9 % 100 mL IVPB  Status:  Discontinued     2 g 200 mL/hr over 30 Minutes Intravenous On call to O.R. 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 Rolm Bookbinder POD#15 Abdominal wound dehiscence--no evisceration. Does not need any reoperative surgery.  -mepitel added over base of wound at superior portion where bowel is present to hopefully prevent fistulization  IR abdominal drain placement x 2 - 07/20/18 - minimal output, CX no growth to date -Interventional radiology following. -WBC 16K today.  CT abd/pel completed.  See read for further details.  Will have IR review scan to determine what may or may not need to be done with drains.  FEN:on  TNA/ can likely add TFs ID: Zosyn 10/8 -->; Cubacin 10/20 -->eraxis 10/21 --> DVT: SCD/subcutaneous heparin Foley:none Follow up: Dr. Donne Hazel    LOS: 16 days    [name] , [facility] 07/26/2018, 12:13 PM Pager: [phone]

## 2018-07-26 NOTE — Progress Notes (Signed)
Subjective:  Events noted- pt required intubation and moving to ICU- still making great urine- 2200- sodium and bicarb improving   Objective Vital signs in last 24 hours: Vitals:   07/26/18 0507 07/26/18 0600 07/26/18 0700 07/26/18 0734  BP: (!) 141/82 137/82 104/64 124/72  Pulse: (!) 112 (!) 110 (!) 104 (!) 106  Resp: 19 (!) 21 20 (!) 21  Temp: (!) 101.1 F (38.4 C) (!) 101.1 F (38.4 C) (!) 100.9 F (38.3 C) (!) 100.6 F (38.1 C)  TempSrc:      SpO2: 100% 100% 100% 100%  Weight:      Height:       Weight change: 10.9 kg  Intake/Output Summary (Last 24 hours) at 07/26/2018 8889 Last data filed at 07/26/2018 0700 Gross per 24 hour  Intake 4951.69 ml  Output 2635 ml  Net 2316.69 ml    Assessment/Plan: 62 year old black male with recent diagnosis of metastatic colon cancer status post surgery with complications including intra-abdominal abscesses and wound dehiscence who also has developed AKI 1.Renal- patient with normal creatinine on 10/7.  It is worsened in the setting of intra-abdominal surgery/ abscesses.  There is been no out right hemodynamic instability that I have seen and I also do not identify any nephrotoxins.  There does not appear to be obstruction on imaging. Urinalysis showing microscoping hematuria and granular casts, possibly c/w ATN.  He is nonoliguric and there are no absolute indications for dialysis at this time actually stable again.  In addition, given his recent devastating diagnosis with complications it may not be appropriate to initiate dialysis in this setting.  I think is a good idea to get palliative care involved.  If may hand is forced I likely would recommend maybe a short trial of HD just to see if renal function would recover as this is acute but would not consider long term chronic HD for this unfortunate pt.   2. Hypertension/volume  - appears euvolemic actually-however, with sodium elevated and has a water deficit. Is now getting free water per  tube- I will inc 3.  Metabolic acidosis-is on a bicarb drip.  improving slightly 4. Anemia  -significant but situational.  Supportive care for now 5.  Hypernatremia-has free water deficit.  Is getting free water, will inc.     Jerry Green    Labs: Basic Metabolic Panel: Recent Labs  Lab 07/24/18 0420 07/25/18 0500 07/25/18 2240 07/26/18 0335  NA 151* 151*  151* 147* 148*  K 4.3 4.2  4.1 4.3 3.9  CL 124* 128*  127* 125* 125*  CO2 16* 13*  13* 12* 15*  GLUCOSE 249* 235*  228* 285* 248*  BUN 84* 94*  93* 99* 98*  CREATININE 3.80* 4.35*  4.27* 4.73* 4.61*  CALCIUM 9.5 9.7  9.7 9.0 8.7*  PHOS 4.2 3.5  --  4.5   Liver Function Tests: Recent Labs  Lab 07/21/18 0035 07/23/18 0346 07/25/18 0500  AST 148* 94* 138*  ALT 137* 114* 105*  ALKPHOS 128* 125 119  BILITOT 1.4* 1.0 1.0  PROT 8.1 8.8* 8.9*  ALBUMIN 2.4* 2.1* 2.0*   No results for input(s): LIPASE, AMYLASE in the last 168 hours. Recent Labs  Lab 07/25/18 0453  AMMONIA 23   CBC: Recent Labs  Lab 07/21/18 0352 07/23/18 0346 07/24/18 0850 07/25/18 0500 07/26/18 0335  WBC 22.9* 20.3* 18.3* 18.6* 16.9*  NEUTROABS  --  15.6*  --   --  12.9*  HGB 9.7* 8.9* 8.8* 8.9* 7.7*  HCT 31.1* 29.5* 28.7* 30.3* 26.4*  MCV 92.0 93.4 93.5 94.7 95.7  PLT 597* 599* 552* 551* 462*   Cardiac Enzymes: Recent Labs  Lab 07/22/18 1644  CKTOTAL 61   CBG: Recent Labs  Lab 07/25/18 1732 07/25/18 1936 07/25/18 2352 07/26/18 0332 07/26/18 0758  GLUCAP 297* 239* 238* 233* 292*    Iron Studies:  Recent Labs    07/26/18 0335  IRON 11*  TIBC 195*  FERRITIN 370*   Studies/Results: Dg Chest 1 View  Result Date: 07/25/2018 CLINICAL DATA:  Tachypnea EXAM: CHEST  1 VIEW COMPARISON:  July 16, 2018 FINDINGS: Central catheter tip is in the superior vena cava. Previous right jugular catheter and nasogastric tube have been removed. No pneumothorax. There is hazy air base opacity in the left mid lower lung  zones with small left pleural effusion. Right lung is clear. Heart is upper normal in size with pulmonary vascularity normal. No adenopathy. No bone lesions. There are drains in the upper abdominal regions. IMPRESSION: Hazy opacity in portions of the left mid lower lung zones, concerning for pneumonia, similar to prior study. Small left pleural effusion. Right lung clear. Stable cardiac silhouette. Central catheter tip in superior vena cava. No pneumothorax. Electronically Signed   By: Lowella Grip III M.D.   On: 07/25/2018 08:11   Mr Brain Wo Contrast  Result Date: 07/24/2018 CLINICAL DATA:  62 year old male with altered mental status. EXAM: MRI HEAD WITHOUT CONTRAST TECHNIQUE: Multiplanar, multiecho pulse sequences of the brain and surrounding structures were obtained without intravenous contrast. COMPARISON:  Head CT without contrast 07/21/2018 FINDINGS: Brain: No restricted diffusion to suggest acute infarction. No midline shift, mass effect, evidence of mass lesion, ventriculomegaly, extra-axial collection or acute intracranial hemorrhage. Cervicomedullary junction and pituitary are within normal limits. Questionable brainstem and cerebellar volume loss out of proportion to cerebral hemisphere volume loss. Scattered and patchy cerebral white matter T2 and FLAIR hyperintensity. Marginal involvement of the right lentiform nuclei. No cortical encephalomalacia or chronic cerebral blood products identified. The other deep gray matter nuclei appear within normal limits. No brainstem or cerebellar signal abnormality identified. Vascular: Major intracranial vascular flow voids are preserved. Skull and upper cervical spine: Negative visible cervical spine. Normal bone marrow signal. Sinuses/Orbits: Normal orbits soft tissues. Trace paranasal sinus mucosal. Other: Mastoids are clear. Grossly normal visible internal auditory structures appear normal. Scalp and face soft tissues appear negative. IMPRESSION: 1.   No acute intracranial abnormality. 2. Questionable disproportionate volume loss of the brainstem and cerebellum, nonspecific. 3. Mild to moderate for age nonspecific signal changes in the cerebral white matter, most commonly due to chronic small vessel disease. Electronically Signed   By: Genevie Ann M.D.   On: 07/24/2018 12:57   Dg Chest Port 1 View  Result Date: 07/25/2018 CLINICAL DATA:  CT and OG placement EXAM: PORTABLE CHEST 1 VIEW COMPARISON:  07/25/2018, 07/16/2018, 07/14/2018 FINDINGS: Endotracheal tube tip is about 2.5 cm superior to the carina. Right upper extremity catheter tip over the cavoatrial region. Esophageal tube tip in the left upper quadrant. Bilateral drainage catheters within the upper quadrants. Tiny left pleural effusion. Atelectasis versus minimal infiltrate at the medial left base. Stable cardiomediastinal silhouette. IMPRESSION: 1. Endotracheal tube tip about 2.5 cm superior to carina. Esophageal tube tip is in the left upper quadrant 2. Trace left pleural effusion with hazy atelectasis or infiltrate at the left base Electronically Signed   By: Donavan Foil M.D.   On: 07/25/2018 19:18   Medications: Infusions: . sodium  chloride 10 mL/hr at 07/26/18 0700  . anidulafungin 100 mg (07/25/18 1613)  . DAPTOmycin (CUBICIN)  IV 850 mg (07/24/18 2056)  . fentaNYL infusion INTRAVENOUS 200 mcg/hr (07/26/18 0700)  . piperacillin-tazobactam (ZOSYN)  IV Stopped (07/26/18 4862)  .  sodium bicarbonate  infusion 1000 mL 125 mL/hr at 07/26/18 0700  . TPN ADULT (ION) 100 mL/hr at 07/26/18 0700  . TPN ADULT (ION)      Scheduled Medications: . chlorhexidine gluconate (MEDLINE KIT)  15 mL Mouth Rinse BID  . fentaNYL (SUBLIMAZE) injection  100 mcg Intravenous Once  . fentaNYL (SUBLIMAZE) injection  50 mcg Intravenous Once  . free water  200 mL Per Tube Q8H  . heparin injection (subcutaneous)  5,000 Units Subcutaneous Q8H  . insulin aspart  3-9 Units Subcutaneous Q4H  . insulin aspart  8  Units Intravenous Once  . insulin glargine  40 Units Subcutaneous Daily  . mouth rinse  15 mL Mouth Rinse 10 times per day  . sodium bicarbonate  100 mEq Intravenous Once  . sodium chloride flush  5 mL Intracatheter Q8H    have reviewed scheduled and prn medications.  Physical Exam: General: sedated on vent Heart: tachy  Lungs: mostly clear Abdomen: distended- intra abdom drains Extremities: no edema    07/26/2018,9:22 AM  LOS: 16 days

## 2018-07-26 NOTE — Progress Notes (Signed)
Pt's BP dropped to 77/49 at 2230. This RN went in to assess. BP continued to be low. Precedex was the most recently medication started/titrated up. This RN began to titrate down on both Precedex and fentanyl. BP continued to drop. MD [place] made aware at 2250, BP of 77/50. MD said to leave Precedex off and monitor for another 15 minutes, if BP is still low to notify her. Pt BP returned to [facility] within that time. Will continue to assess.

## 2018-07-26 NOTE — Progress Notes (Addendum)
[facility] NOTE   Pharmacy Consult for TPN Indication: prolonged ileus  Patient Measurements: Height: 6\' 2"  (188 cm) Weight: (!) 340 lb (154.2 kg) IBW/kg (Calculated) : 82.2 TPN AdjBW (KG): 86.1 Body mass index is 43.65 kg/m.   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 S/p OR 10/9 for exlap and liver biopsy (positive) with colectomy. Attempted TF but with almost immediate vomiting. Started TPN for nutrition support with post-op ileus. Pt is at risk for refeeding.   GI: Pre-albumin up to WNL 16.9. S/p colectomy with going ileus - 109mL drain OP, 42mL NG OP. LBM 10/21 - 10/18: Perisplenic abscess drain, Hepatic abscess drain. Famotidine in TPN - New diagnosis of colon cancer  Endo: Hx DM (Amaryl, metformin PTA, Not on insulin PTA) - CBGs uncontrolled 200-285.  Insulin requirements last 24 hrs: 27 units rSSI + 150 units in TPN + Lantus 35 units daily Lytes: K 3.9, Na stable 148, corr Ca~10.3, CO2 15, others WNL. *No lytes in TPN Renal: AKI - SCr now trend back up to 4.6 (baseline~1), BUN up to 94, UOP 0.6 cc/kg/hr. Bicarb drip started 10/23 Free h20 200/8h Pulm: extubated 10/13 to RA, intubated 10/24 Cards: h/o HTN:  BP mostly ok, tachy to 130s Hepatobil: LFTs moderately elevated (AST 138 / ALT 105), tbili down to WNL, TG up to 316 Neuro: Transient confusion. APAP / morph prn ID: Zosyn postop for gross contamination with stool perforated colon, now Dapto / Eraxis - Tmax/24h 100.5, BCx/liver abscess cx - NGTD   TPN Access: CVC TPN start date: 07/16/18 Nutritional Goals (per RD rec on 10/23): KCal: 2400-2600 KCal Protein: 120-135 g  Fluid: >/= 2L/day  Goal TPN rate is 100 ml/hr (provides 132g protein and 2472 KCal - 100% goal)  Current Nutrition:  Clear liquid TPN  Plan:  - Continue TPN at 100 mL/hr - This TPN  provides 134 g of protein, 312 g of dextrose, and 67 g of lipids which provides 2270 kCals per day, meeting 95% of goal kcal and 100% of goal AA - Electrolytes in TPN: all lytes removed for now; Cl:Ac at max acetate Add daily MVI, trace elements, Pepcid 20 mg to TPN -Reduce dextrose in TPN 10/23 (360>312g) + Continue rSSI + Lantus 40 units daily + increase insulin in TPN bag to 150 units - monitor CBGs and adjust as needed - Patient almost at max insulin in TPN (~65 units/L) - any further adjustments 10/24 need to be outside of TPN bag - Sodium bicarb infusion at 125 ml/hr per MD - Monitor TPN labs, triglyceride trend    Harvel Quale 07/26/2018 8:15 AM    Addendum -The TPN that is currently running became detached from patient during transport this morning  -Plan to leave TPN off and will not initiate d10 infusion due to elevated cbgs -I estimate that patient will miss about 1/3 of his nutrients, kcals, protein etc. -Will re-initiate TPN this evening at [address] 07/26/2018 10:18 AM

## 2018-07-26 NOTE — Progress Notes (Signed)
PULMONARY / CRITICAL CARE MEDICINE   NAME:  Jerry Green, MRN:  [id number], DOB:  November 19, 1955, LOS: 79 ADMISSION DATE:  07/03/2018, CONSULTATION DATE:  07/04/2018 REFERRING MD:  Jeanmarie Hubert, CHIEF COMPLAINT:  Abdominal pain  BRIEF HISTORY:    62 year old diabetic status post exploratory laparotomy 10/9 with finding of perforated sigmoid colon secondary to an obstructing carcinoma.  Pathology positive for invasive metastatic adenocarcinoma.  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 CT head 10/19 > no acute process. MRI brain 10/22 > no acute process. CT chest 10/24 >  CT A / P 10/24 >   CULTURES:  BC at OSH  10/8 MRSA PCR >> neg 19 blood cultures x2>>  ANTIBIOTICS:  OHS zosyn and cipro Zosyn 10/8 >  Anidulafungin 10/20 >  Daptomycin 10/20 >    LINES/TUBES:  10/9 ETT >>10/13 10/9 R DL IJ CVC >> ? Removal date 10/9 Wound vac >> ? Removal date RUE PICC 10/14 >  Colostomy 10/9 >  Abdominal abscess drains 10/18 >  07/25/2018 endotracheal tube>>  CONSULTANTS:  10/8>> CCS 10/9 PCCM  SUBJECTIVE:  Called back PM 10/23 due to tachypnea and AMS. Required transfer to ICU for intubation.  CONSTITUTIONAL: BP 127/72   Pulse (!) 109   Temp (!) 100.4  F (38 C)   Resp 20   Ht 6\' 2"  (1.88 m)   Wt (!) 154.2 kg   SpO2 100%   BMI 43.65 kg/m   I/O last 3 completed shifts: In: 5871.7 [P.O.:120; I.V.:5481.8; Other:70; IV Piggyback:199.9] Out: 2990 [Urine:2530; Emesis/NG output:250; Drains:85; Stool:125]     Vent Mode: PRVC FiO2 (%):  [40 %-100 %] 40 % Set Rate:  [15 bmp] 15 bmp Vt Set:  [509 mL] 620 mL PEEP:  [5 cmH20] 5 cmH20 Plateau Pressure:  [13 cmH20-22 cmH20] 13 cmH20 PHYSICAL EXAM: General: Obese male is currently sedated on fentanyl 200 mcg an hour HEENT: Tracheal tube in place Neuro: Sedated does not follow commands CV: s1s2 rrr, no m/r/g PULM: Diminished breath sounds in the bases GI: Place drains x2 Extremities: warm/dry, +1 edema  Skin: no rashes or lesions    ASSESSMENT AND PLAN   62 year old diabetic status post exploratory laparotomy 10/9 with finding of perforated sigmoid colon secondary to an obstructing carcinoma.  Pathology positive for invasive metastatic adenocarcinoma. Transferred out of ICU 10/15, then PCCM called back PM 10/23 for AMS and tachypnea.  Required transfer to ICU for intubation.   Respiratory insufficiency - Currently in distress and unable to compensate appropriately. -Vent bundle -Intubated on 07/25/2018  Sepsis - due to abdominal abscesses +/- PNA. -Continue current antimicrobial therapy -Culture data. -Procalcitonin 1.69. -CT chest abdomen have been repeated yet to be read  Bowel obstruction with perforation - s/p ex lap 10/9. -Per [place] surgery  Post operative abdominal abscess - s/p IR  drain placement 10/18. -CT of abdomen pending reading -Drains per surgery  Metastatic adenocarcinoma (confirmed after ex lap with biopsy of colon and liver 10/9). -Oncology input appreciated -No role for chemotherapy at this time  AKI - worsening. NAGMA. Hyperchloremia. Lab Results  Component Value Date   CREATININE 4.61 (H) 07/26/2018   CREATININE 4.73 (H) 07/25/2018    CREATININE 4.35 (H) 07/25/2018   CREATININE 4.27 (H) 07/25/2018   Recent Labs  Lab 07/25/18 0500 07/25/18 2240 07/26/18 0335  K 4.2  4.1 4.3 3.9     -Nephrology is following -  questionable need hemodialysis catheter for CVVH near future -Continue bicarbonate drip -Follow electrolytes  Hypernatremia - ~6.7L FWD. Recent Labs  Lab 07/25/18 0500 07/25/18 2240 07/26/18 0335  NA 151*  151* 147* 148*    -Free water per tube -Monitor sodium daily  Metabolic encephalopathy - worse and  now exacerbated by uremia. -Wean sedation as tolerated -Exacerbated by heavy sedation  Malnutrition. -Continue TNA with pharmacy management  DM. CBG (last 3)  Recent Labs    07/26/18 0332 07/26/18 0758 07/26/18 0949  GLUCAP 233* 292* 231*    -Sliding scale insulin -Lantus 40 units subcu daily continue to monitor  Anemia of chronic disease. Recent Labs    07/25/18 0500 07/26/18 0335  HGB 8.9* 7.7*    -Transfuse per protocol   SUMMARY OF TODAY'S PLAN:  Continue medical mechanical ventilatory support Wean as tolerated Continue bicarbonate drip Assess for any type of extubation Family to arrive for further goals of care discussion  Best Practice / Goals of Care / Disposition.   DVT PROPHYLAXIS: Heparin. SUP: Pepcid. MOBILITY: Bedrest. CODE STATUS:  Full.  Needs goals of care discussions.  Daughter spoke with Dr. Chase Caller 10/23 and requested full code.  LABS  Glucose Recent Labs  Lab 07/25/18 1732 07/25/18 1936 07/25/18 2352 07/26/18 0332 07/26/18 0758 07/26/18 0949  GLUCAP 297* 239* 238* 233* 292* 231*    BMET Recent Labs  Lab 07/25/18 0500 07/25/18 2240 07/26/18 0335  NA 151*  151* 147* 148*  K 4.2  4.1 4.3 3.9  CL 128*  127* 125* 125*  CO2 13*  13* 12* 15*  BUN 94*  93* 99* 98*  CREATININE 4.35*  4.27* 4.73* 4.61*  GLUCOSE 235*  228* 285* 248*    Liver Enzymes Recent Labs  Lab 07/21/18 0035 07/23/18 0346 07/25/18 0500  AST 148*  94* 138*  ALT 137* 114* 105*  ALKPHOS 128* 125 119  BILITOT 1.4* 1.0 1.0  ALBUMIN 2.4* 2.1* 2.0*    Electrolytes Recent Labs  Lab 07/24/18 0420 07/25/18 0500 07/25/18 2240 07/26/18 0335  CALCIUM 9.5 9.7  9.7 9.0 8.7*  MG 2.0 2.0  --  1.7  PHOS 4.2 3.5  --  4.5    CBC Recent Labs  Lab 07/24/18 0850 07/25/18 0500 07/26/18 0335  WBC 18.3* 18.6* 16.9*  HGB 8.8* 8.9* 7.7*  HCT 28.7* 30.3* 26.4*  PLT 552* 551* 462*    ABG Recent Labs  Lab 07/25/18 1411 07/25/18 2026 07/26/18 0410  PHART 7.339* 7.182* 7.270*  PCO2ART BELOW REPORTABLE RANGE 38.2 34.9  PO2ART 97.0 292.0* 155*    Coag's Recent Labs  Lab 07/21/18 0352  INR 1.50    Sepsis Markers Recent Labs  Lab 07/25/18 2240 07/26/18 0335  PROCALCITON 1.62 1.69    Cardiac Enzymes No results for input(s): TROPONINI, PROBNP in the last 168 hours.   App CC time: 44 min.   Richardson Landry Minor ACNP Maryanna Shape  PCCM Pager [phone] till 1 pm If no answer page [phone] 07/26/2018, 10:52 AM

## 2018-07-26 NOTE — Consult Note (Signed)
Consultation Note Date: 07/26/2018   Patient Name: Jerry Green  DOB: [birth year]  MRN: [id number]  Age / Sex: 62 y.o., male  PCP: Ollen Bowl, MD Referring Physician: Albertine Patricia, MD  Reason for Consultation: Establishing goals of care and Psychosocial/spiritual support  HPI/Patient Profile: 62 y.o. male  with past medical history of DM, gerd, HTN who was admitted on 07/16/2018 as a transfer from [facility] in [place], [place].  He was found to have a perforated bowel secondary to a large obstructing mass of the splenic flexture with metastases to the liver and peritoneal wall. He subsequently underwent left hemi-colectomy with colostomy and liver biopsy - pathology revealed invasive adenocarcinoma. He was able to be extubated several days after surgery but required re-intubation on 10/24.  He has developed multiple abscesses and acute renal failure. Recent imaging shows LLL airspace consolidation and 9 mm LUL nodule.  There is no role for chemo or any other cancer treatment at this point as he is simply to ill.  While there is no indication for HD at this point, the patient is not a good dialysis candidate.  Clinical Assessment and Goals of Care:   I have reviewed medical records including EPIC notes, labs and imaging, received report from the care team, assessed the patient and then spoke with [name] (Dtr) to schedule a meeting to discuss diagnosis prognosis, GOC, EOL wishes, disposition and options.  Unfortunately [name] works in [place] and will not be at the hospital until after business hours this evening.  She is unable to come to the hospital tomorrow, but is willing to speak with me on the phone at 10:00 am.  Primary Decision Maker:  NEXT OF KIN, Dtr [place]    Telephone meeting scheduled with [name] scheduled for 10/25 at 10:00 am.  It is difficult for  family to come to the hospital as they live in [place].  Code Status/Advance Care Planning:  Full  Prognosis:  Poor given metastatic cancer, multiple abscesses, renal failure, respiratory failure, and poor nutritional status.   Discharge Planning: To Be Determined      Primary Diagnoses: Present on Admission: . Bowel obstruction ([place]) . (Resolved) Colonic mass . Peritoneal carcinomatosis ([place]) . Essential hypertension . GERD (gastroesophageal reflux disease) . (Resolved) Perforated bowel ([place]) . Leukocytosis   I have reviewed the medical record, interviewed the patient and family, and examined the patient. The following aspects are pertinent.  Past Medical History:  Diagnosis Date  . Diabetes mellitus type II, controlled ([place])   . GERD (gastroesophageal reflux disease)   . HTN (hypertension)    Social History   Socioeconomic History  . Marital status: Divorced    Spouse name: Not on file  . Number of children: Not on file  . Years of education: Not on file  . Highest education level: Not on file  Occupational History  . Not on file  Social Needs  . Financial resource strain: Not on file  . Food insecurity:  Worry: Not on file    Inability: Not on file  . Transportation needs:    Medical: Not on file    Non-medical: Not on file  Tobacco Use  . Smoking status: Never Smoker  . Smokeless tobacco: Never Used  Substance and Sexual Activity  . Alcohol use: Not Currently    Frequency: Never  . Drug use: Never  . Sexual activity: Not on file  Lifestyle  . Physical activity:    Days per week: Not on file    Minutes per session: Not on file  . Stress: Not on file  Relationships  . Social connections:    Talks on phone: Not on file    Gets together: Not on file    Attends religious service: Not on file    Active member of club or organization: Not on file    Attends meetings of clubs or organizations: Not on file    Relationship status: Not on file    Other Topics Concern  . Not on file  Social History Narrative  . Not on file   Family History  Problem Relation Age of Onset  . Diabetes Maternal Grandmother    Scheduled Meds: . chlorhexidine gluconate (MEDLINE KIT)  15 mL Mouth Rinse BID  . fentaNYL (SUBLIMAZE) injection  100 mcg Intravenous Once  . fentaNYL (SUBLIMAZE) injection  50 mcg Intravenous Once  . free water  300 mL Per Tube Q6H  . heparin injection (subcutaneous)  5,000 Units Subcutaneous Q8H  . insulin aspart  3-9 Units Subcutaneous Q4H  . insulin glargine  40 Units Subcutaneous Daily  . mouth rinse  15 mL Mouth Rinse 10 times per day  . sodium bicarbonate  100 mEq Intravenous Once  . sodium chloride flush  5 mL Intracatheter Q8H   Continuous Infusions: . sodium chloride 10 mL/hr at 07/26/18 0700  . anidulafungin 100 mg (07/25/18 1613)  . DAPTOmycin (CUBICIN)  IV 850 mg (07/24/18 2056)  . fentaNYL infusion INTRAVENOUS 200 mcg/hr (07/26/18 0700)  . piperacillin-tazobactam (ZOSYN)  IV Stopped (07/26/18 3664)  .  sodium bicarbonate  infusion 1000 mL 125 mL/hr at 07/26/18 0700  . TPN ADULT (ION) 100 mL/hr at 07/26/18 0700  . TPN ADULT (ION)     PRN Meds:.sodium chloride, acetaminophen (TYLENOL) oral liquid 160 mg/5 mL, albuterol, fentaNYL, midazolam, sodium chloride flush Allergies  Allergen Reactions  . Aspirin Nausea Only   Review of Systems patient intubated  Physical Exam  Well developed male, intubated, sedated CV tachycardic Resp no distress (intubated), no frank w/c/r Abdomen wound is open and packed, bandage is clean and dry.  Two drains with little fluid Lower extremities - trace edema, sun tan lines on feet.  Vital Signs: BP 124/72   Pulse (!) 106   Temp (!) 100.6 F (38.1 C)   Resp (!) 21   Ht 6' 2"  (1.88 m)   Wt (!) 154.2 kg   SpO2 100%   BMI 43.65 kg/m  Pain Scale: CPOT POSS *See Group Information*: 1-Acceptable,Awake and alert Pain Score: 0-No pain   SpO2: SpO2: 100 % O2  Device:SpO2: 100 % O2 Flow Rate: .O2 Flow Rate (L/min): 2 L/min  IO: Intake/output summary:   Intake/Output Summary (Last 24 hours) at 07/26/2018 0943 Last data filed at 07/26/2018 0700 Gross per 24 hour  Intake 4951.69 ml  Output 2635 ml  Net 2316.69 ml    LBM: Last BM Date: 07/23/18 Baseline Weight: Weight: 120.4 kg Most recent weight: Weight: (!) 154.2 kg  Palliative Assessment/Data: 20%     Time In: 1:50 Time Out: 2:20 Time Total: 30 min. Greater than 50%  of this time was spent counseling and coordinating care related to the above assessment and plan.  Signed by: Florentina Jenny, PA-C Palliative Medicine Pager: [phone]  Please contact Palliative Medicine Team phone at [phone] for questions and concerns.  For individual provider: See Shea Evans

## 2018-07-26 NOTE — Progress Notes (Signed)
Referring Physician(s): Dr. Thereasa Solo  Supervising Physician: Sandi Mariscal  Patient Status:  [facility] - In-pt  Chief Complaint: Follow-up perisplenic and hepatic abscess drains placed 10/18 by Dr. Barbie Banner  Subjective:  Patient now intubated in ICU due to respiratory distress last night. Opens eyes briefly to tactile stimulation, does not respond to verbal cues this morning.  Allergies: Aspirin  Medications: Prior to Admission medications   Medication Sig Start Date End Date Taking? Authorizing Provider  allopurinol (ZYLOPRIM) 100 MG tablet Take 100 mg by mouth daily. 06/14/18  Yes [provider]  enalapril (VASOTEC) 5 MG tablet Take 5 mg by mouth daily. 06/14/18  Yes [provider]  glimepiride (AMARYL) 2 MG tablet Take 2 mg by mouth 2 (two) times daily. 07/04/18  Yes [provider]  metFORMIN (GLUCOPHAGE) 1000 MG tablet Take 1,000 mg by mouth 2 (two) times daily. 06/14/18  Yes [provider]  pantoprazole (PROTONIX) 40 MG tablet Take 40 mg by mouth daily. 06/14/18  Yes [provider]  simvastatin (ZOCOR) 40 MG tablet Take 40 mg by mouth daily. 06/14/18  Yes [provider]     Vital Signs: BP 124/72   Pulse (!) 106   Temp (!) 100.6 F (38.1 C)   Resp (!) 21   Ht 6\' 2"  (1.88 m)   Wt (!) 340 lb (154.2 kg)   SpO2 100%   BMI 43.65 kg/m   Physical Exam  Constitutional: No distress.  HENT:  Head: Normocephalic.  Cardiovascular: Regular rhythm and normal heart sounds.  Tachycardic  Pulmonary/Chest:  Intubated  Abdominal:  RUQ JP scant tan output; LUQ gravity scant serous output. (+) colostomy  Neurological:  Sedated  Skin: Skin is warm and dry. He is not diaphoretic.  Nursing note and vitals reviewed.   Imaging: Dg Chest 1 View  Result Date: 07/25/2018 CLINICAL DATA:  Tachypnea EXAM: CHEST  1 VIEW COMPARISON:  July 16, 2018 FINDINGS: Central catheter tip is in the superior vena cava. Previous right jugular  catheter and nasogastric tube have been removed. No pneumothorax. There is hazy air base opacity in the left mid lower lung zones with small left pleural effusion. Right lung is clear. Heart is upper normal in size with pulmonary vascularity normal. No adenopathy. No bone lesions. There are drains in the upper abdominal regions. IMPRESSION: Hazy opacity in portions of the left mid lower lung zones, concerning for pneumonia, similar to prior study. Small left pleural effusion. Right lung clear. Stable cardiac silhouette. Central catheter tip in superior vena cava. No pneumothorax. Electronically Signed   By: Lowella Grip III M.D.   On: 07/25/2018 08:11   Mr Brain Wo Contrast  Result Date: 07/24/2018 CLINICAL DATA:  62 year old male with altered mental status. EXAM: MRI HEAD WITHOUT CONTRAST TECHNIQUE: Multiplanar, multiecho pulse sequences of the brain and surrounding structures were obtained without intravenous contrast. COMPARISON:  Head CT without contrast 07/21/2018 FINDINGS: Brain: No restricted diffusion to suggest acute infarction. No midline shift, mass effect, evidence of mass lesion, ventriculomegaly, extra-axial collection or acute intracranial hemorrhage. Cervicomedullary junction and pituitary are within normal limits. Questionable brainstem and cerebellar volume loss out of proportion to cerebral hemisphere volume loss. Scattered and patchy cerebral white matter T2 and FLAIR hyperintensity. Marginal involvement of the right lentiform nuclei. No cortical encephalomalacia or chronic cerebral blood products identified. The other deep gray matter nuclei appear within normal limits. No brainstem or cerebellar signal abnormality identified. Vascular: Major intracranial vascular flow voids are preserved. Skull and  upper cervical spine: Negative visible cervical spine. Normal bone marrow signal. Sinuses/Orbits: Normal orbits soft tissues. Trace paranasal sinus mucosal. Other: Mastoids are clear.  Grossly normal visible internal auditory structures appear normal. Scalp and face soft tissues appear negative. IMPRESSION: 1.  No acute intracranial abnormality. 2. Questionable disproportionate volume loss of the brainstem and cerebellum, nonspecific. 3. Mild to moderate for age nonspecific signal changes in the cerebral white matter, most commonly due to chronic small vessel disease. Electronically Signed   By: Genevie Ann M.D.   On: 07/24/2018 12:57   Dg Chest Port 1 View  Result Date: 07/25/2018 CLINICAL DATA:  CT and OG placement EXAM: PORTABLE CHEST 1 VIEW COMPARISON:  07/25/2018, 07/16/2018, 07/14/2018 FINDINGS: Endotracheal tube tip is about 2.5 cm superior to the carina. Right upper extremity catheter tip over the cavoatrial region. Esophageal tube tip in the left upper quadrant. Bilateral drainage catheters within the upper quadrants. Tiny left pleural effusion. Atelectasis versus minimal infiltrate at the medial left base. Stable cardiomediastinal silhouette. IMPRESSION: 1. Endotracheal tube tip about 2.5 cm superior to carina. Esophageal tube tip is in the left upper quadrant 2. Trace left pleural effusion with hazy atelectasis or infiltrate at the left base Electronically Signed   By: Donavan Foil M.D.   On: 07/25/2018 19:18    Labs:  CBC: Recent Labs    07/23/18 0346 07/24/18 0850 07/25/18 0500 07/26/18 0335  WBC 20.3* 18.3* 18.6* 16.9*  HGB 8.9* 8.8* 8.9* 7.7*  HCT 29.5* 28.7* 30.3* 26.4*  PLT 599* 552* 551* 462*    COAGS: Recent Labs    07/15/2018 0701 07/23/2018 0748 07/12/2018 1402 07/21/18 0352  INR 1.13  --  1.23 1.50  APTT  --  33  --   --     BMP: Recent Labs    07/24/18 0420 07/25/18 0500 07/25/18 2240 07/26/18 0335  NA 151* 151*  151* 147* 148*  K 4.3 4.2  4.1 4.3 3.9  CL 124* 128*  127* 125* 125*  CO2 16* 13*  13* 12* 15*  GLUCOSE 249* 235*  228* 285* 248*  BUN 84* 94*  93* 99* 98*  CALCIUM 9.5 9.7  9.7 9.0 8.7*  CREATININE 3.80* 4.35*  4.27*  4.73* 4.61*  GFRNONAA 16* 13*  14* 12* 12*  GFRAA 18* 15*  16* 14* 14*    LIVER FUNCTION TESTS: Recent Labs    07/19/18 0009 07/21/18 0035 07/23/18 0346 07/25/18 0500  BILITOT 1.9* 1.4* 1.0 1.0  AST 137* 148* 94* 138*  ALT 94* 137* 114* 105*  ALKPHOS 119 128* 125 119  PROT 7.8 8.1 8.8* 8.9*  ALBUMIN 2.5* 2.4* 2.1* 2.0*    Assessment and Plan:  S/p perisplenic and hepatic abscess drains placed 10/18 by Dr. Barbie Banner - both drains with minimal output still, cultures show NGTD. Now intubated and in ICU due to respiratory distress. Abdominal wound dehiscence followed by surgery.   WBC continues to improve (16.9 today), febrile at 100.6, H/H 7.7/26.4, creatinine 4.6; tachycardic on exam - CXR from yesterday shows possible PNA, CT chest/abdomen/pelvisperformed this morning to assess for infectious source/improvement of abscesses - read is pending at time of this note writing.  Continue TID flushes with 3-5 cc NS, record output of each drain daily.   IR will continue to follow.   Electronically Signed: Joaquim Nam, PA-C 07/26/2018, 9:44 AM   I spent a total of 15 Minutes at the the patient's bedside AND on the patient's hospital floor or unit, greater than  50% of which was counseling/coordinating care for perisplenic and hepatic abscess drains.

## 2018-07-27 ENCOUNTER — Inpatient Hospital Stay (HOSPITAL_COMMUNITY): Payer: Medicaid - Out of State

## 2018-07-27 DIAGNOSIS — Z515 Encounter for palliative care: Secondary | ICD-10-CM

## 2018-07-27 DIAGNOSIS — R509 Fever, unspecified: Secondary | ICD-10-CM

## 2018-07-27 DIAGNOSIS — C799 Secondary malignant neoplasm of unspecified site: Secondary | ICD-10-CM

## 2018-07-27 DIAGNOSIS — Z66 Do not resuscitate: Secondary | ICD-10-CM

## 2018-07-27 DIAGNOSIS — R188 Other ascites: Secondary | ICD-10-CM

## 2018-07-27 LAB — GLUCOSE, CAPILLARY
GLUCOSE-CAPILLARY: 173 mg/dL — AB (ref 70–99)
GLUCOSE-CAPILLARY: 57 mg/dL — AB (ref 70–99)
GLUCOSE-CAPILLARY: 90 mg/dL (ref 70–99)
GLUCOSE-CAPILLARY: 93 mg/dL (ref 70–99)
GLUCOSE-CAPILLARY: 98 mg/dL (ref 70–99)
Glucose-Capillary: 121 mg/dL — ABNORMAL HIGH (ref 70–99)
Glucose-Capillary: 146 mg/dL — ABNORMAL HIGH (ref 70–99)

## 2018-07-27 LAB — URINE CULTURE: CULTURE: NO GROWTH

## 2018-07-27 LAB — BASIC METABOLIC PANEL
ANION GAP: 11 (ref 5–15)
BUN: 99 mg/dL — ABNORMAL HIGH (ref 8–23)
CALCIUM: 8.2 mg/dL — AB (ref 8.9–10.3)
CO2: 23 mmol/L (ref 22–32)
Chloride: 113 mmol/L — ABNORMAL HIGH (ref 98–111)
Creatinine, Ser: 5.17 mg/dL — ABNORMAL HIGH (ref 0.61–1.24)
GFR, EST AFRICAN AMERICAN: 12 mL/min — AB (ref >60)
GFR, EST NON AFRICAN AMERICAN: 11 mL/min — AB (ref >60)
Glucose, Bld: 78 mg/dL (ref 70–99)
Potassium: 3.2 mmol/L — ABNORMAL LOW (ref 3.5–5.1)
SODIUM: 147 mmol/L — AB (ref 135–145)

## 2018-07-27 LAB — CBC WITH DIFFERENTIAL/PLATELET
ABS IMMATURE GRANULOCYTES: 0.18 10*3/uL — AB (ref 0.00–0.07)
Basophils Absolute: 0.1 10*3/uL (ref 0.0–0.1)
Basophils Relative: 1 %
Eosinophils Absolute: 0.5 10*3/uL (ref 0.0–0.5)
Eosinophils Relative: 3 %
HCT: 20.6 % — ABNORMAL LOW (ref 39.0–52.0)
HEMOGLOBIN: 6.3 g/dL — AB (ref 13.0–17.0)
Immature Granulocytes: 1 %
LYMPHS PCT: 13 %
Lymphs Abs: 2 10*3/uL (ref 0.7–4.0)
MCH: 28.1 pg (ref 26.0–34.0)
MCHC: 30.6 g/dL (ref 30.0–36.0)
MCV: 92 fL (ref 80.0–100.0)
MONO ABS: 1.2 10*3/uL — AB (ref 0.1–1.0)
Monocytes Relative: 8 %
NEUTROS ABS: 11.4 10*3/uL — AB (ref 1.7–7.7)
Neutrophils Relative %: 74 %
Platelets: 404 10*3/uL — ABNORMAL HIGH (ref 150–400)
RBC: 2.24 MIL/uL — ABNORMAL LOW (ref 4.22–5.81)
RDW: 15.4 % (ref 11.5–15.5)
WBC: 15.4 10*3/uL — AB (ref 4.0–10.5)
nRBC: 0.2 % (ref 0.0–0.2)

## 2018-07-27 LAB — HEMOGLOBIN AND HEMATOCRIT, BLOOD
HCT: 23.5 % — ABNORMAL LOW (ref 39.0–52.0)
Hemoglobin: 7.6 g/dL — ABNORMAL LOW (ref 13.0–17.0)

## 2018-07-27 LAB — PREPARE RBC (CROSSMATCH)

## 2018-07-27 LAB — PROCALCITONIN: Procalcitonin: 2.01 ng/mL

## 2018-07-27 LAB — PHOSPHORUS: PHOSPHORUS: 4.6 mg/dL (ref 2.5–4.6)

## 2018-07-27 LAB — MAGNESIUM: Magnesium: 1.7 mg/dL (ref 1.7–2.4)

## 2018-07-27 MED ORDER — POTASSIUM CHLORIDE 20 MEQ/15ML (10%) PO SOLN
40.0000 meq | Freq: Once | ORAL | Status: AC
  Administered 2018-07-27: 40 meq via ORAL
  Filled 2018-07-27: qty 30

## 2018-07-27 MED ORDER — MAGNESIUM SULFATE 2 GM/50ML IV SOLN
2.0000 g | Freq: Once | INTRAVENOUS | Status: AC
  Administered 2018-07-27: 2 g via INTRAVENOUS
  Filled 2018-07-27: qty 50

## 2018-07-27 MED ORDER — TRAVASOL 10 % IV SOLN
INTRAVENOUS | Status: DC
  Filled 2018-07-27: qty 1699.2

## 2018-07-27 MED ORDER — INSULIN GLARGINE 100 UNIT/ML ~~LOC~~ SOLN
20.0000 [IU] | Freq: Every day | SUBCUTANEOUS | Status: DC
  Administered 2018-07-28: 20 [IU] via SUBCUTANEOUS
  Filled 2018-07-27 (×2): qty 0.2

## 2018-07-27 MED ORDER — TRAVASOL 10 % IV SOLN
INTRAVENOUS | Status: DC
  Administered 2018-07-27: 17:00:00 via INTRAVENOUS
  Filled 2018-07-27: qty 1699.2

## 2018-07-27 MED ORDER — SODIUM CHLORIDE 0.9% IV SOLUTION: Freq: Once | INTRAVENOUS | Status: DC

## 2018-07-27 MED ORDER — LINEZOLID 600 MG/300ML IV SOLN
600.0000 mg | Freq: Two times a day (BID) | INTRAVENOUS | Status: DC
  Administered 2018-07-27 – 2018-08-04 (×17): 600 mg via INTRAVENOUS
  Filled 2018-07-27 (×19): qty 300

## 2018-07-27 MED ORDER — SODIUM CHLORIDE 0.9% IV SOLUTION
Freq: Once | INTRAVENOUS | Status: AC
  Administered 2018-07-27: 06:00:00 via INTRAVENOUS

## 2018-07-27 MED ORDER — DEXTROSE 50 % IV SOLN
INTRAVENOUS | Status: AC
  Administered 2018-07-27: 25 mL
  Filled 2018-07-27: qty 50

## 2018-07-27 MED ORDER — DEXTROSE 5 % IV SOLN
INTRAVENOUS | Status: DC
  Administered 2018-07-27: 08:00:00 via INTRAVENOUS

## 2018-07-27 NOTE — Significant Event (Signed)
PCCM OVERNIGHT  CBC resulted with Hgb 6 Notified by RN  Reviewed other AM labs  Replaced potassium and Magnesium Pt to receive 1 unit PRBC   Signed Dr Seward Carol Pulmonary Critical Care Locums

## 2018-07-27 NOTE — Progress Notes (Signed)
Patient ID: Jerry Green, male   DOB: Nov 26, 1955, 62 y.o.   MRN: [id number]    16 Days Post-Op  Subjective: Patient vomiting yesterday and had OG tube placed, only 150cc documented today, 250cc yesterdat.  Trying to bite ETT, placed on Precedex.  Now with hypotension secondary to precedex.  Objective: Vital signs in last 24 hours: Temp:  [98.6 F (37 C)-103.6 F (39.8 C)] 98.6 F (37 C) (10/25 0645) Pulse Rate:  [74-121] 86 (10/25 0750) Resp:  [15-28] 19 (10/25 0750) BP: (73-141)/(42-123) 110/57 (10/25 0750) SpO2:  [95 %-100 %] 100 % (10/25 0750) FiO2 (%):  [30 %] 30 % (10/25 0750) Weight:  [159.2 kg] 159.2 kg (10/25 0410) Last BM Date: 07/23/18  Intake/Output from previous day: 10/24 0701 - 10/25 0700 In: 6537.9 [I.V.:5225.6; NG/GT:650; IV Piggyback:652.3] Out: 2227 [Urine:1735; Emesis/NG output:150; Drains:52; Stool:290] Intake/Output this shift: Total I/O In: -  Out: 130 [Urine:130]  PE: Heart: regular, in 70s today, tachycardia improved Abd: soft, wound stable with mepitel over bowel that is visible.  All JP drains with minimal output.  Surgical drain with tan cloudy output, but less than 5cc in bulb currently, but 30cc documented yesterday.  LUQ drain with minimal serous output.  RUQ with 12 cc yesterday.  Ostomy with enteric/feculent output.  Lab Results:  Recent Labs    07/26/18 0335 07/27/18 0404  WBC 16.9* 15.4*  HGB 7.7* 6.3*  HCT 26.4* 20.6*  PLT 462* 404*   BMET Recent Labs    07/26/18 0335 07/27/18 0404  NA 148* 147*  K 3.9 3.2*  CL 125* 113*  CO2 15* 23  GLUCOSE 248* 78  BUN 98* 99*  CREATININE 4.61* 5.17*  CALCIUM 8.7* 8.2*   PT/INR No results for input(s): LABPROT, INR in the last 72 hours. CMP     Component Value Date/Time   NA 147 (H) 07/27/2018 0404   K 3.2 (L) 07/27/2018 0404   CL 113 (H) 07/27/2018 0404   CO2 23 07/27/2018 0404   GLUCOSE 78 07/27/2018 0404   BUN 99 (H) 07/27/2018 0404   CREATININE 5.17 (H) 07/27/2018 0404   CALCIUM 8.2 (L) 07/27/2018 0404   PROT 8.9 (H) 07/25/2018 0500   ALBUMIN 2.0 (L) 07/25/2018 0500   AST 138 (H) 07/25/2018 0500   ALT 105 (H) 07/25/2018 0500   ALKPHOS 119 07/25/2018 0500   BILITOT 1.0 07/25/2018 0500   GFRNONAA 11 (L) 07/27/2018 0404   GFRAA 12 (L) 07/27/2018 0404   Lipase  No results found for: LIPASE     Studies/Results: Ct Abdomen Pelvis Wo Contrast  Result Date: 07/26/2018 CLINICAL DATA:  Sepsis, perforated cancer of the splenic flexure of the colon post hemicolectomy, postoperative abscesses EXAM: CT CHEST, ABDOMEN AND PELVIS WITHOUT CONTRAST TECHNIQUE: Multidetector CT imaging of the chest, abdomen and pelvis was performed following the standard protocol without IV contrast. Neither oral nor intravenous contrast were administered. Sagittal and coronal MPR images reconstructed from axial data set. COMPARISON:  CT abdomen and pelvis 07/19/2018 FINDINGS: CT CHEST FINDINGS Cardiovascular: Minimal atherosclerotic calcifications aorta and coronary arteries. Aorta normal caliber. No pericardial effusion. Mediastinum/Nodes: Tip of endotracheal tube above carina. Tip of RIGHT arm PICC line at cavoatrial junction. Nasogastric tube traverses esophagus, esophagus otherwise unremarkable. No mediastinal adenopathy. Base of cervical region normal appearance. Lungs/Pleura: LEFT pleural effusion with compressive atelectasis of the posterior LEFT lung. Dependent atelectasis RIGHT lower lobe. RIGHT upper lobe pulmonary nodule 3 mm diameter image 49. RIGHT upper lobe pulmonary nodule 4 mm diameter image  29. Additional small nodule in RIGHT lower lobe adjacent to major fissure 5 mm diameter image 61. 6 mm LEFT upper lobe nodule image 48. No pneumothorax. Musculoskeletal: No acute osseous findings. CT ABDOMEN PELVIS FINDINGS Hepatobiliary: Beam hardening artifacts from arms traverse liver and spleen. Pigtail drainage catheter superiorly in liver. Persistent air and fluid containing abscess  collection at anterior liver, anterior to the pigtail, 4.6 x 4.3 x 4.0 cm. No definite additional liver lesions Pancreas: Normal appearance Spleen: Predominately obscured by beam hardening artifacts. Adjacent pigtail drainage catheter posterior to spleen. Adrenals/Urinary Tract: Adrenal glands unremarkable. Kidneys grossly normal appearance. No definite hydronephrosis or hydroureter. Bladder decompressed by Foley catheter. Stomach/Bowel: Transverse colostomy RIGHT upper quadrant. Long Hartmann pouch to the level of the descending colon. Stomach decompressed. Small bowel loops unremarkable. Vascular/Lymphatic: Atherosclerotic calcifications aorta and iliac arteries. 12 mm LEFT para-aortic node image 178. Reproductive: Minimal prostatic enlargement Other: Question focal fluid collection in pelvis versus distended segment of fluid containing bowel 5.2 x 4.0 cm image 269. Small fluid collection at the LEFT gutter 3.0 x 3.0 cm image 199. No additional abscess collections. Scattered infiltrative changes of mesentery and omentum, which could be related to surgery and or inflammatory process. Open ventral wound. No hernia. Musculoskeletal: No acute osseous findings. IMPRESSION: BILATERAL pulmonary nodules likely metastases. Bibasilar atelectasis and small to moderate LEFT pleural effusion. Persistent intrahepatic abscess collection measuring 4.6 x 4.3 x 4.0 cm immediately anterior to the pigtail of the drainage catheter. Additional fluid collections at the LEFT gutter and potentially in pelvis, could be sterile or infected postoperative collections. Single enlarged LEFT para-aortic lymph node. Electronically Signed   By: Lavonia Dana M.D.   On: 07/26/2018 11:37   Ct Chest Wo Contrast  Result Date: 07/26/2018 CLINICAL DATA:  Sepsis, perforated cancer of the splenic flexure of the colon post hemicolectomy, postoperative abscesses EXAM: CT CHEST, ABDOMEN AND PELVIS WITHOUT CONTRAST TECHNIQUE: Multidetector CT imaging of  the chest, abdomen and pelvis was performed following the standard protocol without IV contrast. Neither oral nor intravenous contrast were administered. Sagittal and coronal MPR images reconstructed from axial data set. COMPARISON:  CT abdomen and pelvis 07/19/2018 FINDINGS: CT CHEST FINDINGS Cardiovascular: Minimal atherosclerotic calcifications aorta and coronary arteries. Aorta normal caliber. No pericardial effusion. Mediastinum/Nodes: Tip of endotracheal tube above carina. Tip of RIGHT arm PICC line at cavoatrial junction. Nasogastric tube traverses esophagus, esophagus otherwise unremarkable. No mediastinal adenopathy. Base of cervical region normal appearance. Lungs/Pleura: LEFT pleural effusion with compressive atelectasis of the posterior LEFT lung. Dependent atelectasis RIGHT lower lobe. RIGHT upper lobe pulmonary nodule 3 mm diameter image 49. RIGHT upper lobe pulmonary nodule 4 mm diameter image 29. Additional small nodule in RIGHT lower lobe adjacent to major fissure 5 mm diameter image 61. 6 mm LEFT upper lobe nodule image 48. No pneumothorax. Musculoskeletal: No acute osseous findings. CT ABDOMEN PELVIS FINDINGS Hepatobiliary: Beam hardening artifacts from arms traverse liver and spleen. Pigtail drainage catheter superiorly in liver. Persistent air and fluid containing abscess collection at anterior liver, anterior to the pigtail, 4.6 x 4.3 x 4.0 cm. No definite additional liver lesions Pancreas: Normal appearance Spleen: Predominately obscured by beam hardening artifacts. Adjacent pigtail drainage catheter posterior to spleen. Adrenals/Urinary Tract: Adrenal glands unremarkable. Kidneys grossly normal appearance. No definite hydronephrosis or hydroureter. Bladder decompressed by Foley catheter. Stomach/Bowel: Transverse colostomy RIGHT upper quadrant. Long Hartmann pouch to the level of the descending colon. Stomach decompressed. Small bowel loops unremarkable. Vascular/Lymphatic: Atherosclerotic  calcifications aorta and iliac arteries. 12  mm LEFT para-aortic node image 178. Reproductive: Minimal prostatic enlargement Other: Question focal fluid collection in pelvis versus distended segment of fluid containing bowel 5.2 x 4.0 cm image 269. Small fluid collection at the LEFT gutter 3.0 x 3.0 cm image 199. No additional abscess collections. Scattered infiltrative changes of mesentery and omentum, which could be related to surgery and or inflammatory process. Open ventral wound. No hernia. Musculoskeletal: No acute osseous findings. IMPRESSION: BILATERAL pulmonary nodules likely metastases. Bibasilar atelectasis and small to moderate LEFT pleural effusion. Persistent intrahepatic abscess collection measuring 4.6 x 4.3 x 4.0 cm immediately anterior to the pigtail of the drainage catheter. Additional fluid collections at the LEFT gutter and potentially in pelvis, could be sterile or infected postoperative collections. Single enlarged LEFT para-aortic lymph node. Electronically Signed   By: Lavonia Dana M.D.   On: 07/26/2018 11:37   Dg Chest Port 1 View  Result Date: 07/26/2018 CLINICAL DATA:  Respiratory failure. EXAM: PORTABLE CHEST 1 VIEW COMPARISON:  Radiograph of July 25, 2018. FINDINGS: Stable cardiomediastinal silhouette. Endotracheal and nasogastric tubes are unchanged in position. Right-sided PICC line is unchanged. No pneumothorax is noted. Right lung is clear. Increased left basilar atelectasis or infiltrate is noted with associated pleural effusion. Bony thorax is unremarkable. IMPRESSION: Stable support apparatus. Increased left basilar atelectasis or infiltrate is noted with associated pleural effusion. Electronically Signed   By: Marijo Conception, M.D.   On: 07/26/2018 10:14   Dg Chest Port 1 View  Result Date: 07/25/2018 CLINICAL DATA:  CT and OG placement EXAM: PORTABLE CHEST 1 VIEW COMPARISON:  07/25/2018, 07/16/2018, 07/14/2018 FINDINGS: Endotracheal tube tip is about 2.5 cm superior  to the carina. Right upper extremity catheter tip over the cavoatrial region. Esophageal tube tip in the left upper quadrant. Bilateral drainage catheters within the upper quadrants. Tiny left pleural effusion. Atelectasis versus minimal infiltrate at the medial left base. Stable cardiomediastinal silhouette. IMPRESSION: 1. Endotracheal tube tip about 2.5 cm superior to carina. Esophageal tube tip is in the left upper quadrant 2. Trace left pleural effusion with hazy atelectasis or infiltrate at the left base Electronically Signed   By: Donavan Foil M.D.   On: 07/25/2018 19:18   Dg Abd Portable 1v  Result Date: 07/26/2018 CLINICAL DATA:  OG tube placement EXAM: PORTABLE ABDOMEN - 1 VIEW COMPARISON:  CT 07/26/2018 FINDINGS: NG tube tip is in the proximal stomach with the side port near the GE junction. Left abdominal drainage catheter and left pleural pigtail catheter noted. Nonobstructive bowel gas pattern. IMPRESSION: NG tube tip in the proximal stomach. Electronically Signed   By: Rolm Baptise M.D.   On: 07/26/2018 22:01    Anti-infectives: Anti-infectives (From admission, onward)   Start     Dose/Rate Route Frequency Ordered Stop   07/23/18 1500  anidulafungin (ERAXIS) 100 mg in sodium chloride 0.9 % 100 mL IVPB     100 mg 78 mL/hr over 100 Minutes Intravenous Every 24 hours 07/22/18 1420     07/22/18 2000  DAPTOmycin (CUBICIN) 850 mg in sodium chloride 0.9 % IVPB     850 mg 234 mL/hr over 30 Minutes Intravenous Every 48 hours 07/22/18 1459     07/22/18 1530  anidulafungin (ERAXIS) 200 mg in sodium chloride 0.9 % 200 mL IVPB     200 mg 78 mL/hr over 200 Minutes Intravenous  Once 07/22/18 1420 07/22/18 1941   07/03/2018 1800  piperacillin-tazobactam (ZOSYN) IVPB 3.375 g     3.375 g 12.5 mL/hr over 240  Minutes Intravenous Every 8 hours 07/17/2018 1444     07/19/2018 0915  cefoTEtan (CEFOTAN) 2 g in sodium chloride 0.9 % 100 mL IVPB     2 g 200 mL/hr over 30 Minutes Intravenous To Short Stay  07/13/2018 0857 07/17/2018 1900   07/17/2018 0906  cefoTEtan in Dextrose 5% (CEFOTAN) 2-2.08 GM-%(50ML) IVPB    Note to Pharmacy:  [name]   : cabinet override      07/24/2018 0906 07/19/2018 2114   07/04/2018 1200  piperacillin-tazobactam (ZOSYN) IVPB 3.375 g  Status:  Discontinued     3.375 g 12.5 mL/hr over 240 Minutes Intravenous Every 8 hours 07/25/2018 0615 07/04/2018 1341   07/16/2018 0730  cefoTEtan (CEFOTAN) 2 g in sodium chloride 0.9 % 100 mL IVPB  Status:  Discontinued     2 g 200 mL/hr over 30 Minutes Intravenous On call to O.R. 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 Rolm Bookbinder POD#16 Abdominal wound dehiscence--no evisceration. Does not need any reoperative surgery.  -mepitel added over base of wound at superior portion where bowel is present to hopefully prevent fistulization  IR abdominal drain placement x 2 - 07/20/18 - minimal output, CX no growth -Reviewed scan with IR yesterday.  Patient with fevers of 103.6.  There is a collection in the liver just anterior to the current drain; however, given the process of a liver abscess, it is unlikely that this is all fluid.  With that being said, his culture of this collection is negative.  Doubt there is a need currently to adjust or manipulate  this drain.  Other sites are stable.  There is some fluid in the pelvis, but once again, doubt this needs a drain at this time. -respiratory culture, blood CXs are negative.  UA is slightly positive, Urine cx pending, but doubt this is source of fevers.  ? A malignant fever/reaction secondary to perforation of malignancy in his peritoneal cavity. -cont OGT, but has good output from ostomy.  Can likely start TFs in the next day or so pending OG output. -agree with palliative care consult  FEN:on TNA/ can likely add TFs in the next day or so pending OG tube output ID: Zosyn 10/8 -->; Cubacin 10/20 -->eraxis 10/21 --> DVT: SCD/subcutaneous heparin Foley:present Follow up: Dr. Donne Hazel    LOS: 46 days    [name] , [facility] 07/27/2018, 8:43 AM Pager: [phone]

## 2018-07-27 NOTE — Progress Notes (Signed)
CRITICAL VALUE ALERT  Critical Value:  Hemoglobin 6.3  Date & Time Notied:  07/27/18 @ 0435  Provider Notified: Warren Lacy  Orders Received/Actions taken:

## 2018-07-27 NOTE — Progress Notes (Signed)
Daily Progress Note   Patient Name: Jerry Green       Date: 07/27/2018 DOB: 1955/12/24  Age: 62 y.o. MRN#: [id number] Attending Physician: Juanito Doom, MD Primary Care Physician: Ollen Bowl, MD Admit Date: 07/26/2018  Reason for Consultation/Follow-up: Establishing goals of care and Psychosocial/spiritual support  Subjective:  Patient is intubated but awake and responds to words and touch.  Brother [name] is in the room.  Al states he has not seen his brother in 24 years and he has never met the patient's daughter Jerry Green.   Al is a Agricultural consultant and understood resuscitation.  He also seemed to understand how severely ill his brother is.  I then talked on the phone with [name] for 25 min.  Jerry Green is the patient's only child.  The patient is not married.  Jerry Green is the [place].  For support Jerry Green goes to her mother.  She describes her parents as having a close relationship.  [name] lives 15 minutes from her father and sees him frequently.  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  Then about a year ago he retired all together.  Per Jerry Green he is a positive person.  He was walking, talking, driving, paying his bills and even washed her car the week before he came into the hospital.  He is a [place] man.  We discussed his illness.  Metastatic colon cancer in the bowel, liver, peritoneal wall, bilateral lungs, and a large para aortic node.  The cancer seems to form abscesses.  There is 1 in the liver and more in the bowel.  He is not a candidate for chemotherapy or radiation.  Since admission his kidneys have failed thus causing his lungs to fail.  He is not a candidate for hemodialysis.   We are feeding him artificially thru his vein and  he has an open abdominal wound.  He is in significant pain requiring medication.  The ventilator tube makes him uncomfortable and he resists/bites it.  We discussed resuscitation.  I explained that if he passed away - pressing on his chest would likely cause additional problems with his open abdominal wound.  [name] asked that we please not resuscitate him.  I stated that I would change his code status  to DNR in order to protect him from resuscitation attempt.    I explained that his cancer is terminal and that he has a limited amount of time left.  My recommendation is to make him as comfortable as possible and to gradually withdraw things that are making him uncomfortable - labs, frequent vitals, invasive treatments.  I explained that he will pass away, but that we can make it as comfortable as possible.   [name] understood and was tearful. I recommended that she take a day or two and talk with her mother and the family.  We can talk again in the next couple of days.  In the meantime we will continue to provide full support.   I also explained to [place] that he is at very high risk of passing at any time due to the metastatic cancer and that he is simply so ill.  Assessment: AM labs noted.  Patient's creatinine has climbed to 5.17, GFR 12.  Urine output 1735 in last 24 hours.  Hgb dropped to 6.  Patient receiving blood. Precedex discontinued due to drop in BP. Abdominal wound open.  3 fluid collections.  Drains in place with minimal output.  CT scan 10/25 shows bilat pulm nodules, single enlarged para-aortic lymph node, and persistent intrahepatic abscess collection. Patient unable to tolerate any oncological treatments.   Patient Profile/HPI:  61 y.o. male  with past medical history of DM, gerd, HTN who was admitted on 07/06/2018 as a transfer from [facility] in [place], [place].  He was found to have a perforated bowel secondary to a large obstructing mass of the splenic flexture with metastases to  the liver and peritoneal wall. He subsequently underwent left hemi-colectomy with colostomy and liver biopsy - pathology revealed invasive adenocarcinoma. He was able to be extubated several days after surgery but required re-intubation on 10/24.  He has developed multiple abscesses and acute renal failure. Recent imaging shows LLL airspace consolidation and 9 mm LUL nodule.  There is no role for chemo or any other cancer treatment at this point as he is simply to ill.  There is no indication for HD at this point as the patient is making urine.  The patient is felt to be an exceedingly poor candidate for hemodialysis.    Length of Stay: 17  Current Medications: Scheduled Meds:  . sodium chloride   Intravenous Once  . chlorhexidine gluconate (MEDLINE KIT)  15 mL Mouth Rinse BID  . fentaNYL (SUBLIMAZE) injection  100 mcg Intravenous Once  . fentaNYL (SUBLIMAZE) injection  50 mcg Intravenous Once  . free water  300 mL Per Tube Q6H  . heparin injection (subcutaneous)  5,000 Units Subcutaneous Q8H  . insulin aspart  3-9 Units Subcutaneous Q4H  . insulin aspart  8 Units Intravenous Once  . insulin glargine  40 Units Subcutaneous Daily  . mouth rinse  15 mL Mouth Rinse 10 times per day  . sodium bicarbonate  100 mEq Intravenous Once  . sodium chloride flush  5 mL Intracatheter Q8H    Continuous Infusions: . sodium chloride Stopped (07/27/18 0558)  . anidulafungin Stopped (07/26/18 1554)  . DAPTOmycin (CUBICIN)  IV Stopped (07/26/18 2042)  . dexmedetomidine (PRECEDEX) IV infusion 0.4 mcg/kg/hr (07/27/18 0719)  . fentaNYL infusion INTRAVENOUS 300 mcg/hr (07/27/18 0600)  . piperacillin-tazobactam (ZOSYN)  IV Stopped (07/27/18 0544)  .  sodium bicarbonate  infusion 1000 mL 125 mL/hr at 07/27/18 0600  . TPN ADULT (ION) 100 mL/hr at 07/27/18 0600  PRN Meds: sodium chloride, acetaminophen (TYLENOL) oral liquid 160 mg/5 mL, albuterol, fentaNYL, midazolam, sodium chloride flush  Physical Exam        Well developed male, awake, intubated, moves arms and legs spontaneously Resp:  No distress, no w/c/r Abdomen abdominal binder in place over bandaged wound Ext trace edem  Vital Signs: BP 114/68   Pulse 100   Temp 98.6 F (37 C)   Resp (!) 21   Ht 6' 2"  (1.88 m)   Wt (!) 159.2 kg   SpO2 100%   BMI 45.07 kg/m  SpO2: SpO2: 100 % O2 Device: O2 Device: Ventilator O2 Flow Rate: O2 Flow Rate (L/min): 2 L/min  Intake/output summary:   Intake/Output Summary (Last 24 hours) at 07/27/2018 0739 Last data filed at 07/27/2018 0600 Gross per 24 hour  Intake 6402.12 ml  Output 2227 ml  Net 4175.12 ml   LBM: Last BM Date: 07/23/18 Baseline Weight: Weight: 120.4 kg Most recent weight: Weight: (!) 159.2 kg       Palliative Assessment/Data: 20%      Patient Active Problem List   Diagnosis Date Noted  . Acute respiratory failure with hypoxia ([place])   . Perforated colon cancer of splenic flexure s/p colectomy/colostomy 08/02/2018 07/19/2018  . 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 Ileus, postoperative ([place]) 07/19/2018  . 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 Cancer of splenic flexure of colon pT4b, pN1b M1   . Bowel obstruction ([place]) 07/21/2018  . Peritoneal carcinomatosis ([place]) 07/20/2018  . Essential hypertension 07/26/2018  . GERD (gastroesophageal reflux disease) 07/30/2018  . Leukocytosis 07/19/2018    Palliative Care Plan    Recommendations/Plan:  Change code status to DNR  Further [place] conversation needs to take place (Sun/Mon?) with [name] and possibly her mother (support)  Transitioning to comfort approach has been proposed to [place] and she will discuss it with her family.  Goals of Care and Additional Recommendations:  Limitations on Scope of Treatment: Full Scope Treatment  Code Status:  DNR  Prognosis:   Unable to determine.   Pending goals of care.  Patient may live hours to days if support was gradually withdrawn - TPN, Blood fluids,  antibiotics, and eventually ventilator support.  If full support is continued patient may have weeks given rapid deterioration of his kidneys, on-going infection/fever, and widely metastatic cancer.  Discharge Planning:  To Be Determined.  Patient is unlikely to leave the hospital.  Care plan was discussed with daughter, brother, RN, and Attending physician  Thank you for allowing the Palliative Medicine Team to assist in the care of this patient.  Total time spent:  60 min.     Greater than 50%  of this time was spent counseling and coordinating care related to the above assessment and plan.  Florentina Jenny, PA-C Palliative Medicine  Please contact Palliative MedicineTeam phone at [phone] for questions and concerns between 7 am - 7 pm.   Please see AMION for individual provider pager numbers.

## 2018-07-27 NOTE — Progress Notes (Signed)
[facility] Attending:    Subjective: Palliative conversation this morning: DNR, no escalation, plan further conversations in 48 hours to discuss goals of care  Objective: Vitals:   07/27/18 1130 07/27/18 1133 07/27/18 1134 07/27/18 1136  BP: 136/70   110/63  Pulse: 80 92    Resp: 20 (!) 28    Temp: 98.6 F (37 C)  98.9 F (37.2 C)   TempSrc:   Rectal Rectal  SpO2: 99% 99%    Weight:      Height:       Vent Mode: CPAP;PSV FiO2 (%):  [30 %] 30 % Set Rate:  [15 bmp] 15 bmp Vt Set:  [620 mL] 620 mL PEEP:  [5 cmH20] 5 cmH20 Pressure Support:  [10 QIO96-29 cmH20] 12 cmH20 Plateau Pressure:  [14 cmH20] 14 cmH20  Intake/Output Summary (Last 24 hours) at 07/27/2018 1233 Last data filed at 07/27/2018 1139 Gross per 24 hour  Intake 6031.59 ml  Output 1972 ml  Net 4059.59 ml    General:  In bed on vent HENT: NCAT ETT in place PULM: CTA B, vent supported breathing CV: RRR, no mgr GI: no bowel sounds, multiple drains in place, abdominal wound well dressed MSK: normal bulk and tone Neuro: sedated on vent     CBC    Component Value Date/Time   WBC 15.4 (H) 07/27/2018 0404   RBC 2.24 (L) 07/27/2018 0404   HGB 6.3 (LL) 07/27/2018 0404   HCT 20.6 (L) 07/27/2018 0404   PLT 404 (H) 07/27/2018 0404   MCV 92.0 07/27/2018 0404   MCH 28.1 07/27/2018 0404   MCHC 30.6 07/27/2018 0404   RDW 15.4 07/27/2018 0404   LYMPHSABS 2.0 07/27/2018 0404   MONOABS 1.2 (H) 07/27/2018 0404   EOSABS 0.5 07/27/2018 0404   BASOSABS 0.1 07/27/2018 0404    BMET    Component Value Date/Time   NA 147 (H) 07/27/2018 0404   K 3.2 (L) 07/27/2018 0404   CL 113 (H) 07/27/2018 0404   CO2 23 07/27/2018 0404   GLUCOSE 78 07/27/2018 0404   BUN 99 (H) 07/27/2018 0404   CREATININE 5.17 (H) 07/27/2018 0404   CALCIUM 8.2 (L) 07/27/2018 0404   GFRNONAA 11 (L) 07/27/2018 0404   GFRAA 12 (L) 07/27/2018 0404    CXR images personally reviewed showing bilateral airspace disease L >  R  Impression/Plan:  Acute respiratory failure with hypoxemia due to pneumonia, pulmonary edema:  > continue full vent support for now, do not escalate > VAP prevention  Anemia of chronic illness: transfused this morning > would not transfuse again  Multiple abdominal fluid collections > continue drains, antibiotics  Peritonitis/sepsis > continue drains/antibiotics  AKI> worsening > give his multiorgan failure, multiple infections, acute respiratory failure on top of his stage IV cancer he will not survive this illness so hemodialysis will not be medically beneficial  Goals of care: palliative medicine service greatly appreciated.  Code status: DNR, do not escalate  My cc time 31 minutes  Roselie Awkward, MD [facility] Pager: [phone] Cell: [phone] After 3pm or if no response, call [phone]

## 2018-07-27 NOTE — Progress Notes (Signed)
[facility] NOTE   Pharmacy Consult for TPN Indication: prolonged ileus  Patient Measurements: Height: 6\' 2"  (188 cm) Weight: (!) 351 lb (159.2 kg) IBW/kg (Calculated) : 82.2 TPN AdjBW (KG): 86.1 Body mass index is 45.07 kg/m.   Assessment:  48 yom transferred from OSH with abdominal pain, N/V and found to have an acute perforated/obstructing large carcinoma in the colon with multiple metastases. S/p OR 10/9 for exlap and liver biopsy (positive) with colectomy. Attempted TF but with almost immediate vomiting. Started TPN for nutrition support with post-op ileus. Pt is at risk for refeeding. Goals of care discussion scheduled for 10 am today.  GI: Pre-albumin up to WNL 16.9. S/p colectomy with going ileus - 44mL drain OP, 167mL NG OP. LBM 10/24 - 10/18: Perisplenic abscess drain, Hepatic abscess drain. Famotidine in TPN - New diagnosis of metastatic colon cancer  Endo: Hx DM (Amaryl, metformin PTA, Not on insulin PTA) - CBGs have been uncontrolled 200-285. However, over the last day CBGs have steadily declined, last 2 are 78 and 57.  Lytes: K 3.2, Na stable 147, corr Ca~10.3, CO2 23, others WNL. *No lytes in TPN Renal: AKI - SCr now trend back up to 5.17 (baseline~1), BUN up to 99, UOP 0.5 cc/kg/hr. Bicarb drip at 125 cc/hr started 10/23 Free h20 200/8h Pulm: extubated 10/13 to RA, intubated 10/24 Cards: h/o HTN:  BP mostly ok, tachy to 130s Hepatobil: LFTs moderately elevated (AST 138 / ALT 105), tbili down to WNL, TG up to 316 Neuro: Transient confusion. APAP / morph prn ID: Tmax 103.6; Zosyn postop for gross contamination with stool perforated colon, now Dapto / Eraxis, BCx/liver abscess cx - negative   TPN Access: CVC TPN start date: 07/16/18 Nutritional Goals (per RD rec on 10/24): KCal: 1941 - 1805 KCal Protein: 160-200 g  Fluid: >/= 2L/day  Goal TPN rate is 100 ml/hr (provides 170g protein and 1700 KCal - 100% goal)  Current  Nutrition:  Clear liquid TPN  Plan:  - Stop TPN now given that it contains 150 units of insulin and patient has been hypoglycemic - D/c Lantus this am - Start D5W at 100 ml/hr - Restart TPN at 100 mL/hr this afternoon - TPN provides 170 g of protein, 141 g of dextrose, and 60 g of lipids which provides 1700 kCals per day, meeting 100% of goal kcal and goal AA - Electrolytes in TPN: all lytes removed for now; Cl:Ac at max acetate Add daily MVI, trace elements, Pepcid 20 mg to TPN - Dextrose in TPN (141g) + Continue rSSI + hold Lantus 40 units daily and remove insulin from the TPN bag - monitor CBGs and adjust as needed - Sodium bicarb mixed in dextrose at 125 ml/hr per MD - Monitor TPN labs, triglyceride trend   Alanda Slim, PharmD, [facility] Clinical Pharmacist Please see AMION for all Pharmacists' Contact Phone Numbers 07/27/2018, 11:18 AM

## 2018-07-27 NOTE — Progress Notes (Signed)
PULMONARY / CRITICAL CARE MEDICINE   NAME:  Clive Parcel, MRN:  [id number], DOB:  September 22, 1956, LOS: 53 ADMISSION DATE:  07/23/2018, CONSULTATION DATE:  07/25/2018 REFERRING MD:  Jeanmarie Hubert, CHIEF COMPLAINT:  Abdominal pain  BRIEF HISTORY:    62 year old diabetic status post exploratory laparotomy 10/9 with finding of perforated sigmoid colon secondary to an obstructing carcinoma.  Pathology positive for invasive metastatic adenocarcinoma.  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 CT head 10/19 > no acute process. MRI brain 10/22 > no acute process. CT chest 10/24 >  CT A / P 10/24 >  BILATERAL pulmonary nodules likely metastases. Bibasilar atelectasis and small to moderate LEFT pleural effusion. Persistent intrahepatic abscess collection measuring 4.6 x 4.3 x 4.0 cm immediately anterior to the pigtail of the drainage catheter. Additional fluid collections at the LEFT gutter and potentially in pelvis, could be sterile or infected postoperative collections. Single enlarged LEFT para-aortic lymph node. CULTURES:  BC at OSH  10/8 MRSA PCR >> neg 19 blood cultures x2>>negative 10/24 Blood Culture>> Pending 10/24  Urine Culture>>. Negative  10/24 Tracheal Aspirate>>   ANTIBIOTICS:  OHS zosyn and cipro Zosyn 10/8 >  Anidulafungin 10/20 >  Daptomycin 10/20 > 10/24 Linezolid 10/25>>   LINES/TUBES:  10/9 ETT >>10/13 10/9 R DL IJ CVC >> ? Removal date 10/9 Wound vac >> ? Removal date RUE PICC 10/14 >  Colostomy 10/9 >  Abdominal abscess drains 10/18 >  07/25/2018 endotracheal tube>>  CONSULTANTS:  10/8>> CCS 10/9 PCCM  SUBJECTIVE:  Called back PM 10/23 due to tachypnea and AMS. Required transfer to ICU for intubation.  CONSTITUTIONAL: BP 127/71   Pulse 92   Temp 98.9 F (37.2 C) (Rectal)   Resp (!) 28   Ht 6\' 2"  (1.88 m)   Wt (!) 159.2 kg   SpO2 99%   BMI 45.07 kg/m   I/O last 3 completed shifts: In: 10075.5 [I.V.:8693.3; Other:30; NG/GT:650; IV Piggyback:702.2] Out: 6295 [Urine:3695; Emesis/NG output:400; Drains:67; Stool:365]     Vent Mode: CPAP;PSV FiO2 (%):  [30 %] 30 % Set Rate:  [15 bmp] 15 bmp Vt Set:  [284 mL] 620 mL PEEP:  [5 cmH20] 5 cmH20 Pressure Support:  [10 cmH20-12 cmH20] 12 cmH20 Plateau Pressure:  [14 cmH20] 14 cmH20 PHYSICAL EXAM: General: Obese male is currently sedated on fentanyl 200 mcg an hour HEENT: Tracheal tube in place Neuro: Sedated does not follow commands CV: s1s2 rrr, no m/r/g PULM: Diminished breath sounds in the bases GI: Place drains x2 Extremities: warm/dry, +1 edema  Skin: no rashes or lesions    ASSESSMENT AND PLAN   62 year old diabetic status post exploratory laparotomy 10/9 with finding of perforated sigmoid colon secondary to an obstructing carcinoma.  Pathology positive for invasive metastatic adenocarcinoma. Transferred out of  ICU 10/15, then PCCM called back PM 10/23 for AMS and tachypnea.  Required transfer to ICU for intubation.   Respiratory insufficiency - Currently comfortable on 30% 12/5 Pulmonary Nodules >> suspected mets New ? LLL infiltrate Plan - Vent bundle - Intubated on 07/25/2018 - Wean as able  currently weaning on 12/5 - Sat goals are > 92% - ABG prn - CXR daily  Sepsis - due to abdominal abscesses +/- PNA. Source control with liver and abdominal drains Persistent intrahepatic abscess collection measuring 4.6 x 4.3 x 4.0 cm immediately anterior to the pigtail of the drainage catheter. Leukocytosis Plan - Continue current antimicrobial therapy - Switch daptomysyn to linezolid for better pulmonary coverage - Follow Culture data. - Procalcitonin 2.01>> climbing - Map goal of 65 - Lactate prn - re culture as is clinically indicated  Bowel obstruction with perforation - s/p ex lap 10/9. Plan -Per [place] surgery Post operative abdominal abscess - s/p IR drain placement 10/18. Persistent intrahepatic abscess collection measuring 4.6 x 4.3 x 4.0 cm immediately anterior to the pigtail of the drainage catheter.-  -Drains per surgery  Metastatic adenocarcinoma (confirmed after ex lap with biopsy of colon and liver 10/9). -Oncology input appreciated -No role for chemotherapy at this time - Palliative care consult - DNR status  - Daughter to discuss further goals of care with her mother ( her support)    AKI - worsening. Non-obstructive Non-oliguric Per renal, does not recommend long term  HD Would only consider short one time  trial of CVVHD to see if renal function would recover , but do not recommend. Hypernatermia Plan Trend BMET Replete electrolytes as needed Free water as ordered ( 6.7 L deficit)  NAGMA. Hyperchloremia.>> improving Plan Bicarb corrected>> Discontinued HCO3 gtt Trend chemistry    Lab Results  Component Value Date   CREATININE 5.17 (H) 07/27/2018   CREATININE 4.61 (H) 07/26/2018   CREATININE 4.73 (H) 07/25/2018   Recent Labs  Lab 07/25/18 2240 07/26/18 0335 07/27/18 0404  K 4.3 3.9 3.2*      Hypernatremia - ~6.7L FWD. Recent Labs  Lab 07/25/18 2240 07/26/18 0335 07/27/18 0404  NA 147* 875* 643*    Metabolic  encephalopathy - worse and  now exacerbated by uremia. -Exacerbated by heavy sedation Plan: -Wean sedation as tolerated   Malnutrition. -Continue TNA with pharmacy management -- Continue pepcid per TNA for SUP  DM. CBG (last 3)  Recent Labs    07/27/18 0357 07/27/18 0433 07/27/18 0804  GLUCAP 57* 93 121*   - Hypoglycemia event 10/25>> 53 - Sliding scale insulin - Decrease insulin in TNA to 50 Units - Decrease Lantus to 20 units daily  Anemia of chronic disease.  HGB drop to 6.3 Transfused with 1 unit PRBC 10/25 Plan: - Monitor for obvious bleeding - Trend CBC - Transfuse per protocol   SUMMARY OF TODAY'S PLAN:  Continue medical mechanical ventilatory support Wean as tolerated Bicarb corrected Assess for any type of extubation Palliation initiated , Code status to DNR 10/25 No de-escalation in care Further discussions of goals of care with daughter and her mother planned for this weekend  Best Practice / Goals of Care / Disposition.   DVT PROPHYLAXIS: Heparin. SUP: Pepcid. MOBILITY: Bedrest. CODE STATUS: DNR  Ongoing  goals of care discussions to align plan of care with best goals of care for patient.  Marland Kitchen  LABS  Glucose Recent Labs  Lab 07/26/18 1559 07/26/18 1946 07/26/18 2354 07/27/18 0357 07/27/18 0433 07/27/18 0804  GLUCAP  155* 119* 98 57* 93 121*    BMET Recent Labs  Lab 07/25/18 2240 07/26/18 0335 07/27/18 0404  NA 147* 148* 147*  K 4.3 3.9 3.2*  CL 125* 125* 113*  CO2 12* 15* 23  BUN 99* 98* 99*  CREATININE 4.73* 4.61* 5.17*  GLUCOSE 285* 248* 78    Liver Enzymes Recent Labs  Lab 07/21/18 0035 07/23/18 0346 07/25/18 0500  AST 148* 94* 138*  ALT 137* 114* 105*  ALKPHOS 128* 125 119  BILITOT 1.4* 1.0 1.0  ALBUMIN 2.4* 2.1* 2.0*    Electrolytes Recent Labs  Lab 07/25/18 0500 07/25/18 2240 07/26/18 0335 07/27/18 0404  CALCIUM 9.7  9.7 9.0 8.7* 8.2*  MG 2.0  --  1.7 1.7  PHOS 3.5  --  4.5 4.6    CBC Recent Labs   Lab 07/25/18 0500 07/26/18 0335 07/27/18 0404  WBC 18.6* 16.9* 15.4*  HGB 8.9* 7.7* 6.3*  HCT 30.3* 26.4* 20.6*  PLT 551* 462* 404*    ABG Recent Labs  Lab 07/25/18 1411 07/25/18 2026 07/26/18 0410  PHART 7.339* 7.182* 7.270*  PCO2ART BELOW REPORTABLE RANGE 38.2 34.9  PO2ART 97.0 292.0* 155*    Coag's Recent Labs  Lab 07/21/18 0352  INR 1.50    Sepsis Markers Recent Labs  Lab 07/25/18 2240 07/26/18 0335 07/27/18 0404  PROCALCITON 1.62 1.69 2.01    Cardiac Enzymes No results for input(s): TROPONINI, PROBNP in the last 168 hours.   App CC time: 44 min.  Magdalen Spatz, AGACNP-BC [place] Pager # [phone] If no answer page [phone] 07/27/2018, 11:38 AM

## 2018-07-27 NOTE — Progress Notes (Signed)
Subjective:  Now febrile- hypotensive- UOP 1700- kidney function labs only slightly worse  Objective Vital signs in last 24 hours: Vitals:   07/27/18 0615 07/27/18 0630 07/27/18 0645 07/27/18 0750  BP: 102/67 125/73 114/68 (!) 110/57  Pulse: 84 91 100 86  Resp: (!) 21 (!) 23 (!) 21 19  Temp: 99.1 F (37.3 C) 98.8 F (37.1 C) 98.6 F (37 C)   TempSrc:      SpO2: 100% 100% 100% 100%  Weight:      Height:       Weight change: 4.99 kg  Intake/Output Summary (Last 24 hours) at 07/27/2018 0851 Last data filed at 07/27/2018 0744 Gross per 24 hour  Intake 6029.66 ml  Output 2272 ml  Net 3757.66 ml    Assessment/Plan: 62 year old black male with recent diagnosis of metastatic colon cancer status post surgery with complications including intra-abdominal abscesses and wound dehiscence who also has developed AKI 1.Renal- patient with normal creatinine on 10/7.  It is worsened in the setting of intra-abdominal surgery/ abscesses.  There is been no out right hemodynamic instability that I have seen and I also do not identify any nephrotoxins.  There does not appear to be obstruction on imaging. Urinalysis showing microscoping hematuria and granular casts, possibly c/w ATN.  He is nonoliguric and there are no absolute indications for dialysis at this time.  In addition, given his recent devastating diagnosis with complications it may not be appropriate to initiate dialysis in this setting.  I think is a good idea to get palliative care involved.  If my hand is forced I likely would recommend maybe a short trial of HD/CRRT just to see if renal function would recover as this is acute but would not consider long term chronic HD for this unfortunate pt.   2. Hypertension/volume  - appears euvolemic actually-however, with sodium elevated and has a water deficit. Is now getting free water per tube- I will inc 3.  Metabolic acidosis-is on a bicarb drip.  improving  4. Anemia  -significant but situational.   Supportive care for now- getting blood today  5.  Hypernatremia-has free water deficit.  Is getting free water, will inc, improved somewhat- now also on d5w 6. ID- fever- on zosyn and dapto. 7. Dispo- pall care meeting today      Louis Meckel    Labs: Basic Metabolic Panel: Recent Labs  Lab 07/25/18 0500 07/25/18 2240 07/26/18 0335 07/27/18 0404  NA 151*  151* 147* 148* 147*  K 4.2  4.1 4.3 3.9 3.2*  CL 128*  127* 125* 125* 113*  CO2 13*  13* 12* 15* 23  GLUCOSE 235*  228* 285* 248* 78  BUN 94*  93* 99* 98* 99*  CREATININE 4.35*  4.27* 4.73* 4.61* 5.17*  CALCIUM 9.7  9.7 9.0 8.7* 8.2*  PHOS 3.5  --  4.5 4.6   Liver Function Tests: Recent Labs  Lab 07/21/18 0035 07/23/18 0346 07/25/18 0500  AST 148* 94* 138*  ALT 137* 114* 105*  ALKPHOS 128* 125 119  BILITOT 1.4* 1.0 1.0  PROT 8.1 8.8* 8.9*  ALBUMIN 2.4* 2.1* 2.0*   No results for input(s): LIPASE, AMYLASE in the last 168 hours. Recent Labs  Lab 07/25/18 0453  AMMONIA 23   CBC: Recent Labs  Lab 07/23/18 0346 07/24/18 0850 07/25/18 0500 07/26/18 0335 07/27/18 0404  WBC 20.3* 18.3* 18.6* 16.9* 15.4*  NEUTROABS 15.6*  --   --  12.9* 11.4*  HGB 8.9* 8.8* 8.9* 7.7*  6.3*  HCT 29.5* 28.7* 30.3* 26.4* 20.6*  MCV 93.4 93.5 94.7 95.7 92.0  PLT 599* 552* 551* 462* 404*   Cardiac Enzymes: Recent Labs  Lab 07/22/18 1644  CKTOTAL 61   CBG: Recent Labs  Lab 07/26/18 1946 07/26/18 2354 07/27/18 0357 07/27/18 0433 07/27/18 0804  GLUCAP 119* 98 57* 93 121*    Iron Studies:  Recent Labs    07/26/18 0335  IRON 11*  TIBC 195*  FERRITIN 370*   Studies/Results: Ct Abdomen Pelvis Wo Contrast  Result Date: 07/26/2018 CLINICAL DATA:  Sepsis, perforated cancer of the splenic flexure of the colon post hemicolectomy, postoperative abscesses EXAM: CT CHEST, ABDOMEN AND PELVIS WITHOUT CONTRAST TECHNIQUE: Multidetector CT imaging of the chest, abdomen and pelvis was performed following the  standard protocol without IV contrast. Neither oral nor intravenous contrast were administered. Sagittal and coronal MPR images reconstructed from axial data set. COMPARISON:  CT abdomen and pelvis 07/19/2018 FINDINGS: CT CHEST FINDINGS Cardiovascular: Minimal atherosclerotic calcifications aorta and coronary arteries. Aorta normal caliber. No pericardial effusion. Mediastinum/Nodes: Tip of endotracheal tube above carina. Tip of RIGHT arm PICC line at cavoatrial junction. Nasogastric tube traverses esophagus, esophagus otherwise unremarkable. No mediastinal adenopathy. Base of cervical region normal appearance. Lungs/Pleura: LEFT pleural effusion with compressive atelectasis of the posterior LEFT lung. Dependent atelectasis RIGHT lower lobe. RIGHT upper lobe pulmonary nodule 3 mm diameter image 49. RIGHT upper lobe pulmonary nodule 4 mm diameter image 29. Additional small nodule in RIGHT lower lobe adjacent to major fissure 5 mm diameter image 61. 6 mm LEFT upper lobe nodule image 48. No pneumothorax. Musculoskeletal: No acute osseous findings. CT ABDOMEN PELVIS FINDINGS Hepatobiliary: Beam hardening artifacts from arms traverse liver and spleen. Pigtail drainage catheter superiorly in liver. Persistent air and fluid containing abscess collection at anterior liver, anterior to the pigtail, 4.6 x 4.3 x 4.0 cm. No definite additional liver lesions Pancreas: Normal appearance Spleen: Predominately obscured by beam hardening artifacts. Adjacent pigtail drainage catheter posterior to spleen. Adrenals/Urinary Tract: Adrenal glands unremarkable. Kidneys grossly normal appearance. No definite hydronephrosis or hydroureter. Bladder decompressed by Foley catheter. Stomach/Bowel: Transverse colostomy RIGHT upper quadrant. Long Hartmann pouch to the level of the descending colon. Stomach decompressed. Small bowel loops unremarkable. Vascular/Lymphatic: Atherosclerotic calcifications aorta and iliac arteries. 12 mm LEFT  para-aortic node image 178. Reproductive: Minimal prostatic enlargement Other: Question focal fluid collection in pelvis versus distended segment of fluid containing bowel 5.2 x 4.0 cm image 269. Small fluid collection at the LEFT gutter 3.0 x 3.0 cm image 199. No additional abscess collections. Scattered infiltrative changes of mesentery and omentum, which could be related to surgery and or inflammatory process. Open ventral wound. No hernia. Musculoskeletal: No acute osseous findings. IMPRESSION: BILATERAL pulmonary nodules likely metastases. Bibasilar atelectasis and small to moderate LEFT pleural effusion. Persistent intrahepatic abscess collection measuring 4.6 x 4.3 x 4.0 cm immediately anterior to the pigtail of the drainage catheter. Additional fluid collections at the LEFT gutter and potentially in pelvis, could be sterile or infected postoperative collections. Single enlarged LEFT para-aortic lymph node. Electronically Signed   By: Lavonia Dana M.D.   On: 07/26/2018 11:37   Ct Chest Wo Contrast  Result Date: 07/26/2018 CLINICAL DATA:  Sepsis, perforated cancer of the splenic flexure of the colon post hemicolectomy, postoperative abscesses EXAM: CT CHEST, ABDOMEN AND PELVIS WITHOUT CONTRAST TECHNIQUE: Multidetector CT imaging of the chest, abdomen and pelvis was performed following the standard protocol without IV contrast. Neither oral nor intravenous contrast were administered.  Sagittal and coronal MPR images reconstructed from axial data set. COMPARISON:  CT abdomen and pelvis 07/19/2018 FINDINGS: CT CHEST FINDINGS Cardiovascular: Minimal atherosclerotic calcifications aorta and coronary arteries. Aorta normal caliber. No pericardial effusion. Mediastinum/Nodes: Tip of endotracheal tube above carina. Tip of RIGHT arm PICC line at cavoatrial junction. Nasogastric tube traverses esophagus, esophagus otherwise unremarkable. No mediastinal adenopathy. Base of cervical region normal appearance.  Lungs/Pleura: LEFT pleural effusion with compressive atelectasis of the posterior LEFT lung. Dependent atelectasis RIGHT lower lobe. RIGHT upper lobe pulmonary nodule 3 mm diameter image 49. RIGHT upper lobe pulmonary nodule 4 mm diameter image 29. Additional small nodule in RIGHT lower lobe adjacent to major fissure 5 mm diameter image 61. 6 mm LEFT upper lobe nodule image 48. No pneumothorax. Musculoskeletal: No acute osseous findings. CT ABDOMEN PELVIS FINDINGS Hepatobiliary: Beam hardening artifacts from arms traverse liver and spleen. Pigtail drainage catheter superiorly in liver. Persistent air and fluid containing abscess collection at anterior liver, anterior to the pigtail, 4.6 x 4.3 x 4.0 cm. No definite additional liver lesions Pancreas: Normal appearance Spleen: Predominately obscured by beam hardening artifacts. Adjacent pigtail drainage catheter posterior to spleen. Adrenals/Urinary Tract: Adrenal glands unremarkable. Kidneys grossly normal appearance. No definite hydronephrosis or hydroureter. Bladder decompressed by Foley catheter. Stomach/Bowel: Transverse colostomy RIGHT upper quadrant. Long Hartmann pouch to the level of the descending colon. Stomach decompressed. Small bowel loops unremarkable. Vascular/Lymphatic: Atherosclerotic calcifications aorta and iliac arteries. 12 mm LEFT para-aortic node image 178. Reproductive: Minimal prostatic enlargement Other: Question focal fluid collection in pelvis versus distended segment of fluid containing bowel 5.2 x 4.0 cm image 269. Small fluid collection at the LEFT gutter 3.0 x 3.0 cm image 199. No additional abscess collections. Scattered infiltrative changes of mesentery and omentum, which could be related to surgery and or inflammatory process. Open ventral wound. No hernia. Musculoskeletal: No acute osseous findings. IMPRESSION: BILATERAL pulmonary nodules likely metastases. Bibasilar atelectasis and small to moderate LEFT pleural effusion.  Persistent intrahepatic abscess collection measuring 4.6 x 4.3 x 4.0 cm immediately anterior to the pigtail of the drainage catheter. Additional fluid collections at the LEFT gutter and potentially in pelvis, could be sterile or infected postoperative collections. Single enlarged LEFT para-aortic lymph node. Electronically Signed   By: Lavonia Dana M.D.   On: 07/26/2018 11:37   Dg Chest Port 1 View  Result Date: 07/26/2018 CLINICAL DATA:  Respiratory failure. EXAM: PORTABLE CHEST 1 VIEW COMPARISON:  Radiograph of July 25, 2018. FINDINGS: Stable cardiomediastinal silhouette. Endotracheal and nasogastric tubes are unchanged in position. Right-sided PICC line is unchanged. No pneumothorax is noted. Right lung is clear. Increased left basilar atelectasis or infiltrate is noted with associated pleural effusion. Bony thorax is unremarkable. IMPRESSION: Stable support apparatus. Increased left basilar atelectasis or infiltrate is noted with associated pleural effusion. Electronically Signed   By: Marijo Conception, M.D.   On: 07/26/2018 10:14   Dg Chest Port 1 View  Result Date: 07/25/2018 CLINICAL DATA:  CT and OG placement EXAM: PORTABLE CHEST 1 VIEW COMPARISON:  07/25/2018, 07/16/2018, 07/14/2018 FINDINGS: Endotracheal tube tip is about 2.5 cm superior to the carina. Right upper extremity catheter tip over the cavoatrial region. Esophageal tube tip in the left upper quadrant. Bilateral drainage catheters within the upper quadrants. Tiny left pleural effusion. Atelectasis versus minimal infiltrate at the medial left base. Stable cardiomediastinal silhouette. IMPRESSION: 1. Endotracheal tube tip about 2.5 cm superior to carina. Esophageal tube tip is in the left upper quadrant 2. Trace left pleural effusion  with hazy atelectasis or infiltrate at the left base Electronically Signed   By: Donavan Foil M.D.   On: 07/25/2018 19:18   Dg Abd Portable 1v  Result Date: 07/26/2018 CLINICAL DATA:  OG tube placement  EXAM: PORTABLE ABDOMEN - 1 VIEW COMPARISON:  CT 07/26/2018 FINDINGS: NG tube tip is in the proximal stomach with the side port near the GE junction. Left abdominal drainage catheter and left pleural pigtail catheter noted. Nonobstructive bowel gas pattern. IMPRESSION: NG tube tip in the proximal stomach. Electronically Signed   By: Rolm Baptise M.D.   On: 07/26/2018 22:01   Medications: Infusions: . sodium chloride Stopped (07/27/18 0558)  . anidulafungin Stopped (07/26/18 1554)  . DAPTOmycin (CUBICIN)  IV Stopped (07/26/18 2042)  . dexmedetomidine (PRECEDEX) IV infusion 0.4 mcg/kg/hr (07/27/18 0719)  . dextrose 100 mL/hr at 07/27/18 0815  . fentaNYL infusion INTRAVENOUS 300 mcg/hr (07/27/18 0600)  . piperacillin-tazobactam (ZOSYN)  IV Stopped (07/27/18 0544)  .  sodium bicarbonate  infusion 1000 mL 125 mL/hr at 07/27/18 0600    Scheduled Medications: . sodium chloride   Intravenous Once  . chlorhexidine gluconate (MEDLINE KIT)  15 mL Mouth Rinse BID  . fentaNYL (SUBLIMAZE) injection  100 mcg Intravenous Once  . fentaNYL (SUBLIMAZE) injection  50 mcg Intravenous Once  . free water  300 mL Per Tube Q6H  . heparin injection (subcutaneous)  5,000 Units Subcutaneous Q8H  . insulin aspart  3-9 Units Subcutaneous Q4H  . insulin aspart  8 Units Intravenous Once  . mouth rinse  15 mL Mouth Rinse 10 times per day  . sodium bicarbonate  100 mEq Intravenous Once  . sodium chloride flush  5 mL Intracatheter Q8H    have reviewed scheduled and prn medications.  Physical Exam: General: sedated on vent Heart: tachy  Lungs: mostly clear Abdomen: distended- intra abdom drains Extremities: no edema    07/27/2018,8:51 AM  LOS: 17 days

## 2018-07-27 NOTE — Progress Notes (Signed)
Hypoglycemic Event  CBG: 57  Treatment: D50 IV 25 mL  Symptoms: Shaky (could be related to cooling blanket)  Follow-up CBG: [id number] CBG Result:93  Possible Reasons for Event: Unknown    Jerry Green

## 2018-07-27 NOTE — Progress Notes (Signed)
[facility] Progress Note Patient Name: Jerry Green DOB: 1956-06-07 MRN: [id number]   Date of Service  07/27/2018  HPI/Events of Note  Hgb 6.3, continues to be on pressors  eICU Interventions  Transfuse 1 unit PRBC     Intervention Category Intermediate Interventions: Bleeding - evaluation and treatment with blood products  [name] 07/27/2018, 6:05 AM

## 2018-07-27 NOTE — Progress Notes (Addendum)
[facility]    Date of Admission:  07/17/2018    ID: Jerry Green is a 62 y.o. 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 Had palliative care discussion and family agreed to no further escalation of care  Medications:  . sodium chloride   Intravenous Once  . chlorhexidine gluconate (MEDLINE KIT)  15 mL Mouth Rinse BID  . fentaNYL (SUBLIMAZE) injection  100 mcg Intravenous Once  . fentaNYL (SUBLIMAZE) injection  50 mcg Intravenous Once  . free water  300 mL Per Tube Q6H  . heparin injection (subcutaneous)  5,000 Units Subcutaneous Q8H  . insulin aspart  3-9 Units Subcutaneous Q4H  . [START ON 07/28/2018] insulin glargine  20 Units Subcutaneous Daily  . mouth rinse  15 mL Mouth Rinse 10 times per day  . sodium bicarbonate  100 mEq Intravenous Once  . sodium chloride flush  5 mL Intracatheter Q8H    Objective: Vital signs in last 24 hours: Temp:  [98.1 F (36.7 C)-103.6 F (39.8 C)] 98.6 F (37 C) (10/25 1600) Pulse Rate:  [68-119] 70 (10/25 1600) Resp:  [11-28] 11 (10/25 1600) BP: (73-136)/(42-101) 100/65 (10/25 1600) SpO2:  [95 %-100 %] 99 % (10/25 1600) FiO2 (%):  [30 %] 30 % (10/25 1533) Weight:  [159.2 kg] 159.2 kg (10/25 0410) Physical Exam  Constitutional:  He appears well-developed and  well-nourished. No distress.  Abdominal: open abdominal wound with pink granulation tissue, drains still having milky/grey fluid.  Lab Results Recent Labs    07/26/18 0335 07/27/18 0404  WBC 16.9* 15.4*  HGB 7.7* 6.3*  HCT 26.4* 20.6*  NA 148* 147*  K 3.9 3.2*  CL 125* 113*  CO2 15* 23  BUN 98* 99*  CREATININE 4.61* 5.17*   Liver Panel Recent Labs    07/25/18 0500  PROT 8.9*  ALBUMIN 2.0*  AST 138*  ALT 105*  ALKPHOS 119  BILITOT 1.0   Microbiology: reviewed Studies/Results: Ct Abdomen Pelvis Wo Contrast  Result Date: 07/26/2018 CLINICAL DATA:  Sepsis, perforated cancer of the splenic flexure of the colon post hemicolectomy, postoperative abscesses EXAM: CT CHEST, ABDOMEN AND PELVIS WITHOUT CONTRAST TECHNIQUE: Multidetector CT imaging of the chest, abdomen and pelvis was performed following the standard protocol without IV contrast. Neither oral nor intravenous contrast were administered. Sagittal and coronal MPR images reconstructed from axial data set. COMPARISON:  CT abdomen and pelvis 07/19/2018 FINDINGS: CT CHEST FINDINGS Cardiovascular: Minimal atherosclerotic calcifications aorta and coronary arteries. Aorta normal caliber. No pericardial effusion. Mediastinum/Nodes: Tip of endotracheal tube above carina. Tip of RIGHT arm PICC line at cavoatrial junction. Nasogastric tube traverses esophagus, esophagus otherwise unremarkable. No mediastinal adenopathy. Base of cervical region normal appearance. Lungs/Pleura: LEFT pleural effusion with compressive atelectasis of the posterior LEFT lung. Dependent atelectasis RIGHT lower lobe. RIGHT upper lobe pulmonary nodule 3  mm diameter image 49. RIGHT upper lobe pulmonary nodule 4 mm diameter image 29. Additional small nodule in RIGHT lower lobe adjacent to major fissure 5 mm diameter image 61. 6 mm LEFT upper lobe nodule image 48. No pneumothorax. Musculoskeletal: No acute osseous findings. CT ABDOMEN PELVIS FINDINGS Hepatobiliary: Beam  hardening artifacts from arms traverse liver and spleen. Pigtail drainage catheter superiorly in liver. Persistent air and fluid containing abscess collection at anterior liver, anterior to the pigtail, 4.6 x 4.3 x 4.0 cm. No definite additional liver lesions Pancreas: Normal appearance Spleen: Predominately obscured by beam hardening artifacts. Adjacent pigtail drainage catheter posterior to spleen. Adrenals/Urinary Tract: Adrenal glands unremarkable. Kidneys grossly normal appearance. No definite hydronephrosis or hydroureter. Bladder decompressed by Foley catheter. Stomach/Bowel: Transverse colostomy RIGHT upper quadrant. Long Hartmann pouch to the level of the descending colon. Stomach decompressed. Small bowel loops unremarkable. Vascular/Lymphatic: Atherosclerotic calcifications aorta and iliac arteries. 12 mm LEFT para-aortic node image 178. Reproductive: Minimal prostatic enlargement Other: Question focal fluid collection in pelvis versus distended segment of fluid containing bowel 5.2 x 4.0 cm image 269. Small fluid collection at the LEFT gutter 3.0 x 3.0 cm image 199. No additional abscess collections. Scattered infiltrative changes of mesentery and omentum, which could be related to surgery and or inflammatory process. Open ventral wound. No hernia. Musculoskeletal: No acute osseous findings. IMPRESSION: BILATERAL pulmonary nodules likely metastases. Bibasilar atelectasis and small to moderate LEFT pleural effusion. Persistent intrahepatic abscess collection measuring 4.6 x 4.3 x 4.0 cm immediately anterior to the pigtail of the drainage catheter. Additional fluid collections at the LEFT gutter and potentially in pelvis, could be sterile or infected postoperative collections. Single enlarged LEFT para-aortic lymph node. Electronically Signed   By: Lavonia Dana M.D.   On: 07/26/2018 11:37   Ct Chest Wo Contrast  Result Date: 07/26/2018 CLINICAL DATA:  Sepsis, perforated cancer of the splenic flexure of  the colon post hemicolectomy, postoperative abscesses EXAM: CT CHEST, ABDOMEN AND PELVIS WITHOUT CONTRAST TECHNIQUE: Multidetector CT imaging of the chest, abdomen and pelvis was performed following the standard protocol without IV contrast. Neither oral nor intravenous contrast were administered. Sagittal and coronal MPR images reconstructed from axial data set. COMPARISON:  CT abdomen and pelvis 07/19/2018 FINDINGS: CT CHEST FINDINGS Cardiovascular: Minimal atherosclerotic calcifications aorta and coronary arteries. Aorta normal caliber. No pericardial effusion. Mediastinum/Nodes: Tip of endotracheal tube above carina. Tip of RIGHT arm PICC line at cavoatrial junction. Nasogastric tube traverses esophagus, esophagus otherwise unremarkable. No mediastinal adenopathy. Base of cervical region normal appearance. Lungs/Pleura: LEFT pleural effusion with compressive atelectasis of the posterior LEFT lung. Dependent atelectasis RIGHT lower lobe. RIGHT upper lobe pulmonary nodule 3 mm diameter image 49. RIGHT upper lobe pulmonary nodule 4 mm diameter image 29. Additional small nodule in RIGHT lower lobe adjacent to major fissure 5 mm diameter image 61. 6 mm LEFT upper lobe nodule image 48. No pneumothorax. Musculoskeletal: No acute osseous findings. CT ABDOMEN PELVIS FINDINGS Hepatobiliary: Beam hardening artifacts from arms traverse liver and spleen. Pigtail drainage catheter superiorly in liver. Persistent air and fluid containing abscess collection at anterior liver, anterior to the pigtail, 4.6 x 4.3 x 4.0 cm. No definite additional liver lesions Pancreas: Normal appearance Spleen: Predominately obscured by beam hardening artifacts. Adjacent pigtail drainage catheter posterior to spleen. Adrenals/Urinary Tract: Adrenal glands unremarkable. Kidneys grossly normal appearance. No definite hydronephrosis or hydroureter. Bladder decompressed by Foley catheter. Stomach/Bowel: Transverse colostomy RIGHT upper quadrant. Long  Hartmann pouch to the level of the descending colon. Stomach  decompressed. Small bowel loops unremarkable. Vascular/Lymphatic: Atherosclerotic calcifications aorta and iliac arteries. 12 mm LEFT para-aortic node image 178. Reproductive: Minimal prostatic enlargement Other: Question focal fluid collection in pelvis versus distended segment of fluid containing bowel 5.2 x 4.0 cm image 269. Small fluid collection at the LEFT gutter 3.0 x 3.0 cm image 199. No additional abscess collections. Scattered infiltrative changes of mesentery and omentum, which could be related to surgery and or inflammatory process. Open ventral wound. No hernia. Musculoskeletal: No acute osseous findings. IMPRESSION: BILATERAL pulmonary nodules likely metastases. Bibasilar atelectasis and small to moderate LEFT pleural effusion. Persistent intrahepatic abscess collection measuring 4.6 x 4.3 x 4.0 cm immediately anterior to the pigtail of the drainage catheter. Additional fluid collections at the LEFT gutter and potentially in pelvis, could be sterile or infected postoperative collections. Single enlarged LEFT para-aortic lymph node. Electronically Signed   By: Lavonia Dana M.D.   On: 07/26/2018 11:37   Dg Chest Port 1 View  Result Date: 07/27/2018 CLINICAL DATA:  Respiratory failure. EXAM: PORTABLE CHEST 1 VIEW COMPARISON:  07/26/2018 and chest CT 07/26/2018 FINDINGS: Right-sided PICC line with tip at the cavoatrial junction. Nasogastric tube with tip and side-port over the stomach in the left upper quadrant. Endotracheal tube has tip approximately 4.9 cm above the carina. Left basilar pleural pigtail catheter unchanged. Lungs are hypoinflated with hazy opacification over the left lung and left base slightly worse likely layering effusion with associated basilar atelectasis. Infection in the left lung is possible. Cardiomediastinal silhouette and remainder of the exam is unchanged. IMPRESSION: Slight worsening hazy opacification over the  left lung and lung base likely layering effusion with atelectasis, although infection is possible. Tubes and lines as described. Electronically Signed   By: Marin Olp M.D.   On: 07/27/2018 10:38   Dg Chest Port 1 View  Result Date: 07/26/2018 CLINICAL DATA:  Respiratory failure. EXAM: PORTABLE CHEST 1 VIEW COMPARISON:  Radiograph of July 25, 2018. FINDINGS: Stable cardiomediastinal silhouette. Endotracheal and nasogastric tubes are unchanged in position. Right-sided PICC line is unchanged. No pneumothorax is noted. Right lung is clear. Increased left basilar atelectasis or infiltrate is noted with associated pleural effusion. Bony thorax is unremarkable. IMPRESSION: Stable support apparatus. Increased left basilar atelectasis or infiltrate is noted with associated pleural effusion. Electronically Signed   By: Marijo Conception, M.D.   On: 07/26/2018 10:14   Dg Chest Port 1 View  Result Date: 07/25/2018 CLINICAL DATA:  CT and OG placement EXAM: PORTABLE CHEST 1 VIEW COMPARISON:  07/25/2018, 07/16/2018, 07/14/2018 FINDINGS: Endotracheal tube tip is about 2.5 cm superior to the carina. Right upper extremity catheter tip over the cavoatrial region. Esophageal tube tip in the left upper quadrant. Bilateral drainage catheters within the upper quadrants. Tiny left pleural effusion. Atelectasis versus minimal infiltrate at the medial left base. Stable cardiomediastinal silhouette. IMPRESSION: 1. Endotracheal tube tip about 2.5 cm superior to carina. Esophageal tube tip is in the left upper quadrant 2. Trace left pleural effusion with hazy atelectasis or infiltrate at the left base Electronically Signed   By: Donavan Foil M.D.   On: 07/25/2018 19:18   Dg Abd Portable 1v  Result Date: 07/26/2018 CLINICAL DATA:  OG tube placement EXAM: PORTABLE ABDOMEN - 1 VIEW COMPARISON:  CT 07/26/2018 FINDINGS: NG tube tip is in the proximal stomach with the side port near the GE junction. Left abdominal drainage  catheter and left pleural pigtail catheter noted. Nonobstructive bowel gas pattern. IMPRESSION: NG tube tip in the proximal  stomach. Electronically Signed   By: Rolm Baptise M.D.   On: 07/26/2018 22:01     Assessment/Plan:  We have started him linezolid instead of daptomycin to provide MRSA pulmonary coverage. Continue on piptazo plus anidulafungin - very broad coverage for intra-abdominal and hcap pathogens. Unsure if this change in antimicrobials would do much given his underlying multi organ insult.  Will sign off, call if questions.   [facility] for Infectious Diseases Cell: [phone] Pager: [phone]  07/27/2018, 5:18 PM       Elzie Rings. [place] [facility] [phone]

## 2018-07-27 NOTE — Progress Notes (Signed)
Nutrition Follow-up  DOCUMENTATION CODES:   Obesity unspecified  INTERVENTION:   Continue TPN per Pharmacy  Recommend trial of trickle TF as medically able: Vital AF 1.2 @ 20   NUTRITION DIAGNOSIS:   Inadequate oral intake related to poor appetite, nausea, vomiting as evidenced by per patient/family report.  Being addressed via TPN  GOAL:   Patient will meet greater than or equal to 90% of their needs  Met  MONITOR:   Vent status, Labs, TF tolerance, Weight trends, I & O's, Skin(TPN tolerance)  REASON FOR ASSESSMENT:   Consult, Ventilator Enteral/tube feeding initiation and management  ASSESSMENT:   62 year old male who presented with abdominal pain. PMH significant for hypertension, type 2 diabetes mellitus, GERD. 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  10/9 - s/p ex lap, liver biopsy, colectomy with open abdomen and wound VAC 10/11 - start trickle feeds Vital HP @ 20 ml/hr, pt vomited, TF stopped 10/13 - extubated, TPNstarted 10/16 - TPN increased to 65 ml/hr 10/17 - TPNincreased to goal rate of 100 ml/hr, NGT pulled out and replaced 10/18- Perisplenic and hepatic abscess drains placed by IR; NGT pulled out 10/22- diet advanced to clears  10/23 Transfer to ICU, Intubated 10/25 Palliative care discussion, code status changed to DNR, no escalation of care, plan for further conversations regarding [place] in 48 hours  No plan for RRT at present time OG to LIS   Noted hypoglycemic event yesterday;  TPN stopped with hypoglycemic event as TPN contained large amount (150 units) of insulin.  Pt had been receiving insulin in TPN as well as ss novolog and lantus.  TPN to resume this evening @ 100 ml/hr;TPN adjusted to new nutrition recommendations to provide 170 g of protein, 1700 kcals. New TPN contains 141 g of dextrose  , previous TPN contained 312 g of dextrose  Recommend considering insulin drip if pt hyperglycemic in the future while on TPN   Labs: sodium 147, potassium 3.2, BUN 99, Creatinine 5.17 Meds: D5 at 100 ml/hr  Diet Order:   Diet Order            Diet NPO time specified  Diet effective now              EDUCATION NEEDS:   No education needs have been identified at this time  Skin:  Skin Assessment: Skin Integrity Issues: Skin Integrity Issues:: Other (Comment) Wound Vac: removed Other: abdominal wound dehiscence, colostomy, perisplenic and hepatic drains  Last BM:  10/24 small amount via colostomy  Height:   Ht Readings from Last 1 Encounters:  07/25/18 6' 2" (1.88 m)    Weight:   Wt Readings from Last 1 Encounters:  07/27/18 (!) 159.2 kg    Ideal Body Weight:  80.91 kg  BMI:  Body mass index is 45.07 kg/m.  Estimated Nutritional Needs:   Kcal:  1555-1805 kcals   Protein:  160-200 g  Fluid:  >/= 2 L/day   Kerman Passey MS, RD, LDN, CNSC [phone] Pager  [phone] Weekend/On-Call Pager

## 2018-07-27 NOTE — Progress Notes (Signed)
Referring Physician(s): Dr. Thereasa Solo  Supervising Physician: Sandi Mariscal  Patient Status:  [facility] - In-pt  Chief Complaint: Follow-up perisplenic and hepatic abscess drains placed 10/18 by Dr. Barbie Banner  Subjective:  Patient remains intubated in ICU, code status changed to DNR. Palliative care involved, awaiting further discussion on care moving forward - no escalation of care at this time.  Patient opens eyes and moves arms somewhat purposefully when examining abdomen.   Allergies: Aspirin  Medications: Prior to Admission medications   Medication Sig Start Date End Date Taking? Authorizing Provider  allopurinol (ZYLOPRIM) 100 MG tablet Take 100 mg by mouth daily. 06/14/18  Yes [provider]  enalapril (VASOTEC) 5 MG tablet Take 5 mg by mouth daily. 06/14/18  Yes [provider]  glimepiride (AMARYL) 2 MG tablet Take 2 mg by mouth 2 (two) times daily. 07/04/18  Yes [provider]  metFORMIN (GLUCOPHAGE) 1000 MG tablet Take 1,000 mg by mouth 2 (two) times daily. 06/14/18  Yes [provider]  pantoprazole (PROTONIX) 40 MG tablet Take 40 mg by mouth daily. 06/14/18  Yes [provider]  simvastatin (ZOCOR) 40 MG tablet Take 40 mg by mouth daily. 06/14/18  Yes [provider]     Vital Signs: BP 108/64   Pulse 69   Temp 98.1 F (36.7 C) (Rectal)   Resp 15   Ht 6\' 2"  (1.88 m)   Wt (!) 351 lb (159.2 kg)   SpO2 99%   BMI 45.07 kg/m   Physical Exam  Constitutional: No distress.  Cardiovascular: Normal rate, regular rhythm and normal heart sounds.  Pulmonary/Chest:  Intubated  Abdominal:  Right JP ~5 cc serosanguineous output; left gravity bag with scant serous drainage. RN reports flushing without issue.  Neurological:  sedated  Skin: Skin is warm and dry. He is not diaphoretic.  Nursing note and vitals reviewed.   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 Neither oral nor intravenous contrast were administered. Sagittal and coronal MPR images reconstructed from axial data set. COMPARISON:  CT abdomen and pelvis 07/19/2018 FINDINGS: CT CHEST FINDINGS Cardiovascular: Minimal atherosclerotic calcifications aorta and coronary arteries. Aorta normal caliber. No pericardial effusion. Mediastinum/Nodes: Tip of endotracheal tube above carina. Tip of RIGHT arm PICC line at cavoatrial junction. Nasogastric tube traverses esophagus, esophagus otherwise unremarkable. No mediastinal adenopathy. Base of cervical region normal appearance. Lungs/Pleura: LEFT pleural effusion with compressive atelectasis of the posterior LEFT lung. Dependent atelectasis RIGHT lower lobe. RIGHT upper lobe pulmonary nodule 3 mm diameter image 49. RIGHT upper lobe pulmonary nodule 4 mm diameter image 29. Additional small nodule in RIGHT lower lobe adjacent to major fissure 5 mm diameter image 61. 6 mm LEFT upper lobe nodule image 48. No pneumothorax. Musculoskeletal: No acute osseous findings. CT ABDOMEN PELVIS FINDINGS Hepatobiliary: Beam hardening artifacts from arms traverse liver and spleen. Pigtail drainage catheter superiorly in liver. Persistent air and fluid containing abscess collection at anterior liver, anterior to the pigtail, 4.6 x 4.3 x 4.0 cm. No definite additional liver lesions Pancreas: Normal appearance Spleen: Predominately obscured by beam hardening artifacts. Adjacent pigtail drainage catheter posterior to spleen. Adrenals/Urinary Tract: Adrenal glands unremarkable. Kidneys grossly normal appearance. No definite hydronephrosis or hydroureter. Bladder decompressed by  Foley catheter. Stomach/Bowel: Transverse colostomy  RIGHT upper quadrant. Long Hartmann pouch to the level of the descending colon. Stomach decompressed. Small bowel loops unremarkable. Vascular/Lymphatic: Atherosclerotic calcifications aorta and iliac arteries. 12 mm LEFT para-aortic node image 178. Reproductive: Minimal prostatic enlargement Other: Question focal fluid collection in pelvis versus distended segment of fluid containing bowel 5.2 x 4.0 cm image 269. Small fluid collection at the LEFT gutter 3.0 x 3.0 cm image 199. No additional abscess collections. Scattered infiltrative changes of mesentery and omentum, which could be related to surgery and or inflammatory process. Open ventral wound. No hernia. Musculoskeletal: No acute osseous findings. IMPRESSION: BILATERAL pulmonary nodules likely metastases. Bibasilar atelectasis and small to moderate LEFT pleural effusion. Persistent intrahepatic abscess collection measuring 4.6 x 4.3 x 4.0 cm immediately anterior to the pigtail of the drainage catheter. Additional fluid collections at the LEFT gutter and potentially in pelvis, could be sterile or infected postoperative collections. Single enlarged LEFT para-aortic lymph node. Electronically Signed   By: Lavonia Dana M.D.   On: 07/26/2018 11:37   Dg Chest 1 View  Result Date: 07/25/2018 CLINICAL DATA:  Tachypnea EXAM: CHEST  1 VIEW COMPARISON:  July 16, 2018 FINDINGS: Central catheter tip is in the superior vena cava. Previous right jugular catheter and nasogastric tube have been removed. No pneumothorax. There is hazy air base opacity in the left mid lower lung zones with small left pleural effusion. Right lung is clear. Heart is upper normal in size with pulmonary vascularity normal. No adenopathy. No bone lesions. There are drains in the upper abdominal regions. IMPRESSION: Hazy opacity in portions of the left mid lower lung zones, concerning for pneumonia, similar to prior study. Small left pleural effusion. Right lung clear. Stable cardiac  silhouette. Central catheter tip in superior vena cava. No pneumothorax. Electronically Signed   By: Lowella Grip III M.D.   On: 07/25/2018 08:11   Ct Chest Wo Contrast  Result Date: 07/26/2018 CLINICAL DATA:  Sepsis, perforated cancer of the splenic flexure of the colon post hemicolectomy, postoperative abscesses EXAM: CT CHEST, ABDOMEN AND PELVIS WITHOUT CONTRAST TECHNIQUE: Multidetector CT imaging of the chest, abdomen and pelvis was performed following the standard protocol without IV contrast. Neither oral nor intravenous contrast were administered. Sagittal and coronal MPR images reconstructed from axial data set. COMPARISON:  CT abdomen and pelvis 07/19/2018 FINDINGS: CT CHEST FINDINGS Cardiovascular: Minimal atherosclerotic calcifications aorta and coronary arteries. Aorta normal caliber. No pericardial effusion. Mediastinum/Nodes: Tip of endotracheal tube above carina. Tip of RIGHT arm PICC line at cavoatrial junction. Nasogastric tube traverses esophagus, esophagus otherwise unremarkable. No mediastinal adenopathy. Base of cervical region normal appearance. Lungs/Pleura: LEFT pleural effusion with compressive atelectasis of the posterior LEFT lung. Dependent atelectasis RIGHT lower lobe. RIGHT upper lobe pulmonary nodule 3 mm diameter image 49. RIGHT upper lobe pulmonary nodule 4 mm diameter image 29. Additional small nodule in RIGHT lower lobe adjacent to major fissure 5 mm diameter image 61. 6 mm LEFT upper lobe nodule image 48. No pneumothorax. Musculoskeletal: No acute osseous findings. CT ABDOMEN PELVIS FINDINGS Hepatobiliary: Beam hardening artifacts from arms traverse liver and spleen. Pigtail drainage catheter superiorly in liver. Persistent air and fluid containing abscess collection at anterior liver, anterior to the pigtail, 4.6 x 4.3 x 4.0 cm. No definite additional liver lesions Pancreas: Normal appearance Spleen: Predominately obscured by beam hardening artifacts. Adjacent pigtail  drainage catheter posterior to spleen. Adrenals/Urinary Tract: Adrenal glands unremarkable. Kidneys grossly normal appearance. No definite hydronephrosis or hydroureter. Bladder decompressed  by Foley catheter. Stomach/Bowel: Transverse colostomy RIGHT upper quadrant. Long Hartmann pouch to the level of the descending colon. Stomach decompressed. Small bowel loops unremarkable. Vascular/Lymphatic: Atherosclerotic calcifications aorta and iliac arteries. 12 mm LEFT para-aortic node image 178. Reproductive: Minimal prostatic enlargement Other: Question focal fluid collection in pelvis versus distended segment of fluid containing bowel 5.2 x 4.0 cm image 269. Small fluid collection at the LEFT gutter 3.0 x 3.0 cm image 199. No additional abscess collections. Scattered infiltrative changes of mesentery and omentum, which could be related to surgery and or inflammatory process. Open ventral wound. No hernia. Musculoskeletal: No acute osseous findings. IMPRESSION: BILATERAL pulmonary nodules likely metastases. Bibasilar atelectasis and small to moderate LEFT pleural effusion. Persistent intrahepatic abscess collection measuring 4.6 x 4.3 x 4.0 cm immediately anterior to the pigtail of the drainage catheter. Additional fluid collections at the LEFT gutter and potentially in pelvis, could be sterile or infected postoperative collections. Single enlarged LEFT para-aortic lymph node. Electronically Signed   By: Lavonia Dana M.D.   On: 07/26/2018 11:37   Mr Brain Wo Contrast  Result Date: 07/24/2018 CLINICAL DATA:  62 year old male with altered mental status. EXAM: MRI HEAD WITHOUT CONTRAST TECHNIQUE: Multiplanar, multiecho pulse sequences of the brain and surrounding structures were obtained without intravenous contrast. COMPARISON:  Head CT without contrast 07/21/2018 FINDINGS: Brain: No restricted diffusion to suggest acute infarction. No midline shift, mass effect, evidence of mass lesion, ventriculomegaly, extra-axial  collection or acute intracranial hemorrhage. Cervicomedullary junction and pituitary are within normal limits. Questionable brainstem and cerebellar volume loss out of proportion to cerebral hemisphere volume loss. Scattered and patchy cerebral white matter T2 and FLAIR hyperintensity. Marginal involvement of the right lentiform nuclei. No cortical encephalomalacia or chronic cerebral blood products identified. The other deep gray matter nuclei appear within normal limits. No brainstem or cerebellar signal abnormality identified. Vascular: Major intracranial vascular flow voids are preserved. Skull and upper cervical spine: Negative visible cervical spine. Normal bone marrow signal. Sinuses/Orbits: Normal orbits soft tissues. Trace paranasal sinus mucosal. Other: Mastoids are clear. Grossly normal visible internal auditory structures appear normal. Scalp and face soft tissues appear negative. IMPRESSION: 1.  No acute intracranial abnormality. 2. Questionable disproportionate volume loss of the brainstem and cerebellum, nonspecific. 3. Mild to moderate for age nonspecific signal changes in the cerebral white matter, most commonly due to chronic small vessel disease. Electronically Signed   By: Genevie Ann M.D.   On: 07/24/2018 12:57   Dg Chest Port 1 View  Result Date: 07/27/2018 CLINICAL DATA:  Respiratory failure. EXAM: PORTABLE CHEST 1 VIEW COMPARISON:  07/26/2018 and chest CT 07/26/2018 FINDINGS: Right-sided PICC line with tip at the cavoatrial junction. Nasogastric tube with tip and side-port over the stomach in the left upper quadrant. Endotracheal tube has tip approximately 4.9 cm above the carina. Left basilar pleural pigtail catheter unchanged. Lungs are hypoinflated with hazy opacification over the left lung and left base slightly worse likely layering effusion with associated basilar atelectasis. Infection in the left lung is possible. Cardiomediastinal silhouette and remainder of the exam is unchanged.  IMPRESSION: Slight worsening hazy opacification over the left lung and lung base likely layering effusion with atelectasis, although infection is possible. Tubes and lines as described. Electronically Signed   By: Marin Olp M.D.   On: 07/27/2018 10:38   Dg Chest Port 1 View  Result Date: 07/26/2018 CLINICAL DATA:  Respiratory failure. EXAM: PORTABLE CHEST 1 VIEW COMPARISON:  Radiograph of July 25, 2018. FINDINGS: Stable cardiomediastinal silhouette.  Endotracheal and nasogastric tubes are unchanged in position. Right-sided PICC line is unchanged. No pneumothorax is noted. Right lung is clear. Increased left basilar atelectasis or infiltrate is noted with associated pleural effusion. Bony thorax is unremarkable. IMPRESSION: Stable support apparatus. Increased left basilar atelectasis or infiltrate is noted with associated pleural effusion. Electronically Signed   By: Marijo Conception, M.D.   On: 07/26/2018 10:14   Dg Chest Port 1 View  Result Date: 07/25/2018 CLINICAL DATA:  CT and OG placement EXAM: PORTABLE CHEST 1 VIEW COMPARISON:  07/25/2018, 07/16/2018, 07/14/2018 FINDINGS: Endotracheal tube tip is about 2.5 cm superior to the carina. Right upper extremity catheter tip over the cavoatrial region. Esophageal tube tip in the left upper quadrant. Bilateral drainage catheters within the upper quadrants. Tiny left pleural effusion. Atelectasis versus minimal infiltrate at the medial left base. Stable cardiomediastinal silhouette. IMPRESSION: 1. Endotracheal tube tip about 2.5 cm superior to carina. Esophageal tube tip is in the left upper quadrant 2. Trace left pleural effusion with hazy atelectasis or infiltrate at the left base Electronically Signed   By: Donavan Foil M.D.   On: 07/25/2018 19:18   Dg Abd Portable 1v  Result Date: 07/26/2018 CLINICAL DATA:  OG tube placement EXAM: PORTABLE ABDOMEN - 1 VIEW COMPARISON:  CT 07/26/2018 FINDINGS: NG tube tip is in the proximal stomach with the side  port near the GE junction. Left abdominal drainage catheter and left pleural pigtail catheter noted. Nonobstructive bowel gas pattern. IMPRESSION: NG tube tip in the proximal stomach. Electronically Signed   By: Rolm Baptise M.D.   On: 07/26/2018 22:01    Labs:  CBC: Recent Labs    07/24/18 0850 07/25/18 0500 07/26/18 0335 07/27/18 0404  WBC 18.3* 18.6* 16.9* 15.4*  HGB 8.8* 8.9* 7.7* 6.3*  HCT 28.7* 30.3* 26.4* 20.6*  PLT 552* 551* 462* 404*    COAGS: Recent Labs    07/23/2018 0701 07/08/2018 0748 07/11/18 1402 07/21/18 0352  INR 1.13  --  1.23 1.50  APTT  --  33  --   --     BMP: Recent Labs    07/25/18 0500 07/25/18 2240 07/26/18 0335 07/27/18 0404  NA 151*  151* 147* 148* 147*  K 4.2  4.1 4.3 3.9 3.2*  CL 128*  127* 125* 125* 113*  CO2 13*  13* 12* 15* 23  GLUCOSE 235*  228* 285* 248* 78  BUN 94*  93* 99* 98* 99*  CALCIUM 9.7  9.7 9.0 8.7* 8.2*  CREATININE 4.35*  4.27* 4.73* 4.61* 5.17*  GFRNONAA 13*  14* 12* 12* 11*  GFRAA 15*  16* 14* 14* 12*    LIVER FUNCTION TESTS: Recent Labs    07/19/18 0009 07/21/18 0035 07/23/18 0346 07/25/18 0500  BILITOT 1.9* 1.4* 1.0 1.0  AST 137* 148* 94* 138*  ALT 94* 137* 114* 105*  ALKPHOS 119 128* 125 119  PROT 7.8 8.1 8.8* 8.9*  ALBUMIN 2.5* 2.4* 2.1* 2.0*    Assessment and Plan:  S/p perisplenic and hepatic abscess drains placed 10/18 by Dr. Barbie Banner - both drains with minimal output still, cultures show NGTD. Remains intubated and in ICU, code status changed to DNR and palliative care on board - awaiting further discussion with daughter. No escalation or de-escalation of care at this time. Abdominal wound dehiscence followed by surgery.   WBC continues to improve (15.4 today), Tmax 103.6, H/H 6.3/20.6 - PRBCs infusing now. Reviewed with Dr. Pascal Lux who discussed drains with surgery - could reposition  hepatic abscess drain in IR with fluoro and/or remove LUQ drain if no/minimal output at beside. Decided no need  for drain manipulation at this time given overall decline.  Continue TID flushes with 3-5 cc NS, record output of each drain daily.   IR will continue to follow.   Electronically Signed: Joaquim Nam, PA-C 07/27/2018, 2:31 PM   I spent a total of 15 Minutes at the the patient's bedside AND on the patient's hospital floor or unit, greater than 50% of which was counseling/coordinating care for perisplenic and hepatic abscess drains.

## 2018-07-28 ENCOUNTER — Inpatient Hospital Stay (HOSPITAL_COMMUNITY): Payer: Medicaid - Out of State

## 2018-07-28 LAB — CBC WITH DIFFERENTIAL/PLATELET
ABS IMMATURE GRANULOCYTES: 0.14 10*3/uL — AB (ref 0.00–0.07)
Basophils Absolute: 0.1 10*3/uL (ref 0.0–0.1)
Basophils Relative: 1 %
Eosinophils Absolute: 0.5 10*3/uL (ref 0.0–0.5)
Eosinophils Relative: 3 %
HCT: 25.4 % — ABNORMAL LOW (ref 39.0–52.0)
HEMOGLOBIN: 8.1 g/dL — AB (ref 13.0–17.0)
Immature Granulocytes: 1 %
LYMPHS ABS: 1.7 10*3/uL (ref 0.7–4.0)
LYMPHS PCT: 12 %
MCH: 28.7 pg (ref 26.0–34.0)
MCHC: 31.9 g/dL (ref 30.0–36.0)
MCV: 90.1 fL (ref 80.0–100.0)
MONO ABS: 0.8 10*3/uL (ref 0.1–1.0)
Monocytes Relative: 6 %
NEUTROS ABS: 11.4 10*3/uL — AB (ref 1.7–7.7)
Neutrophils Relative %: 77 %
Platelets: 391 10*3/uL (ref 150–400)
RBC: 2.82 MIL/uL — AB (ref 4.22–5.81)
RDW: 14.6 % (ref 11.5–15.5)
WBC: 14.6 10*3/uL — AB (ref 4.0–10.5)
nRBC: 0.1 % (ref 0.0–0.2)

## 2018-07-28 LAB — RENAL FUNCTION PANEL
Albumin: 1.6 g/dL — ABNORMAL LOW (ref 3.5–5.0)
Anion gap: 11 (ref 5–15)
BUN: 99 mg/dL — ABNORMAL HIGH (ref 8–23)
CALCIUM: 8.2 mg/dL — AB (ref 8.9–10.3)
CO2: 22 mmol/L (ref 22–32)
CREATININE: 4.7 mg/dL — AB (ref 0.61–1.24)
Chloride: 110 mmol/L (ref 98–111)
GFR calc non Af Amer: 12 mL/min — ABNORMAL LOW (ref >60)
GFR, EST AFRICAN AMERICAN: 14 mL/min — AB (ref >60)
Glucose, Bld: 124 mg/dL — ABNORMAL HIGH (ref 70–99)
Phosphorus: 5.7 mg/dL — ABNORMAL HIGH (ref 2.5–4.6)
Potassium: 3.3 mmol/L — ABNORMAL LOW (ref 3.5–5.1)
Sodium: 143 mmol/L (ref 135–145)

## 2018-07-28 LAB — TYPE AND SCREEN
ABO/RH(D): O POS
ANTIBODY SCREEN: NEGATIVE
UNIT DIVISION: 0

## 2018-07-28 LAB — MAGNESIUM: MAGNESIUM: 1.9 mg/dL (ref 1.7–2.4)

## 2018-07-28 LAB — BPAM RBC
Blood Product Expiration Date: 201911182359
ISSUE DATE / TIME: 201910251042
UNIT TYPE AND RH: 5100

## 2018-07-28 LAB — GLUCOSE, CAPILLARY
GLUCOSE-CAPILLARY: 150 mg/dL — AB (ref 70–99)
GLUCOSE-CAPILLARY: 151 mg/dL — AB (ref 70–99)
GLUCOSE-CAPILLARY: 143 mg/dL — AB (ref 70–99)
GLUCOSE-CAPILLARY: 187 mg/dL — AB (ref 70–99)
Glucose-Capillary: 112 mg/dL — ABNORMAL HIGH (ref 70–99)
Glucose-Capillary: 125 mg/dL — ABNORMAL HIGH (ref 70–99)
Glucose-Capillary: 235 mg/dL — ABNORMAL HIGH (ref 70–99)

## 2018-07-28 LAB — PROCALCITONIN: Procalcitonin: 1.82 ng/mL

## 2018-07-28 MED ORDER — TRAVASOL 10 % IV SOLN
INTRAVENOUS | Status: DC
  Administered 2018-07-28: 18:00:00 via INTRAVENOUS
  Filled 2018-07-28: qty 1699.2

## 2018-07-28 NOTE — Progress Notes (Signed)
[facility] NOTE   Pharmacy Consult for TPN Indication: prolonged ileus  Patient Measurements: Height: 6\' 2"  (188 cm) Weight: (!) 339 lb (153.8 kg) IBW/kg (Calculated) : 82.2 TPN AdjBW (KG): 86.1 Body mass index is 43.53 kg/m.   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 S/p OR 10/9 for exlap and liver biopsy (positive) with colectomy. Attempted TF but with almost immediate vomiting. Started TPN for nutrition support with post-op ileus. Pt is at risk for refeeding.  Poor prognosis for survival, family conference with Palliative Care ongoing.   GI: Pre-albumin up to WNL 16.9. S/p colectomy with going ileus - 55mL drain OP, 179mL NG OP. LBM 10/24 - 10/18: Perisplenic abscess drain, Hepatic abscess drain. Famotidine in TPN - New diagnosis of metastatic colon cancer  Endo: Hx DM (Amaryl, metformin PTA, Not on insulin PTA) - CBGs dropped - required holding Lantus and removing TPN insulin.  Within the last 24hr. CBGs have been 112-150 on sliding scale insulin only.   Lytes: K 3.3, Na stable 143, corr Ca~10.3, CO2 22, Mag. down slightly - 1.9, phos up slightly 5.7. *No lytes in TPN Renal: AKI - SCr now trending back down (4.7)(baseline~1), BUN up to 99, UOP 0.6 cc/kg/hr. Off Bicarb drip - Free h20 300/q6 Pulm: extubated 10/13 to RA, intubated 10/24 Cards: h/o HTN:  VSS Hepatobil: LFTs moderately elevated (AST 138 / ALT 105), tbili down to WNL, TG up to 316 Neuro: Transient confusion. APAP / morph prn ID: Tmax [id number]; Zosyn postop for gross contamination with stool perforated colon, now Dapto / Eraxis, BCx/liver abscess cx - negative   TPN Access: CVC TPN start date: 07/16/18 Nutritional Goals (per RD rec on 10/24): KCal: 8270 - 1805 KCal Protein: 160-200 g  Fluid: >/= 2L/day  Goal TPN rate is 100 ml/hr (provides 170g  protein and 1700 KCal - 100% goal)  Current Nutrition:  Clear liquid TPN  Plan:  - TPN at 100 mL/hr - TPN provides 170 g of protein, 141 g of dextrose, and 60 g of lipids which provides 1700 kCals per day, meeting 100% of goal kcal and goal AA - Electrolytes in TPN: Will add small amount of potassium and magnesium back to TPN - Continue just SSI for now and trend CBG's Add daily MVI, trace elements, Pepcid 20 mg to TPN - Monitor TPN labs, triglyceride trend   Rober Minion, PharmD., MS Clinical Pharmacist Pager:  [phone] Thank you for allowing pharmacy to be part of this patients care team. 07/28/2018, 8:54 AM

## 2018-07-28 NOTE — Progress Notes (Signed)
Subjective:  I see conversations with palliative care and CCM and completely agree with assessments- somewhat stable overnight- making urine- kidney numbers stable   Objective Vital signs in last 24 hours: Vitals:   07/28/18 0700 07/28/18 0740 07/28/18 0745 07/28/18 0829  BP: 117/65   114/66  Pulse: 67  66 77  Resp: 12  16 (!) 26  Temp: 99.5 F (37.5 C) 99.7 F (37.6 C) 99.3 F (37.4 C)   TempSrc:  Core    SpO2: 99%  99%   Weight:      Height:       Weight change: -5.443 kg  Intake/Output Summary (Last 24 hours) at 07/28/2018 0948 Last data filed at 07/28/2018 3220 Gross per 24 hour  Intake 5249 ml  Output 3690 ml  Net 1559 ml    Assessment/Plan: 62 year old black male with recent diagnosis of metastatic colon cancer status post surgery with complications including intra-abdominal abscesses and wound dehiscence who also has developed AKI 1.Renal- patient with normal creatinine on 10/7.  It is worsened in the setting of intra-abdominal surgery/ abscesses.  There is been no out right hemodynamic instability that I have seen and I also do not identify any nephrotoxins.  There does not appear to be obstruction on imaging. Urinalysis showing microscoping hematuria and granular casts, possibly c/w ATN.  He is nonoliguric and there are no absolute indications for dialysis at this time.  In addition, given his recent devastating diagnosis with complications it is not appropriate to initiate dialysis in this setting.  Through conversations it appears that dialysis is not being considered.  Renal will sign off, call with questions  2. Hypertension/volume  - appears euvolemic actually-however, with sodium elevated and has a water deficit. Is now getting free water per tube and d5- has improved  3.  Metabolic acidosis-is on a bicarb drip.  improving  4. Anemia  -significant but situational.  Supportive care for now- blood 10/25 5.  Hypernatremia-resolved  6. ID- fever- on zosyn and dapto. 7.  Dispo- pall care meeting with approp talking points, seems like moving toward comfort care   [place]: Basic Metabolic Panel: Recent Labs  Lab 07/26/18 0335 07/27/18 0404 07/28/18 0402  NA 148* 147* 143  K 3.9 3.2* 3.3*  CL 125* 113* 110  CO2 15* 23 22  GLUCOSE 248* 78 124*  BUN 98* 99* 99*  CREATININE 4.61* 5.17* 4.70*  CALCIUM 8.7* 8.2* 8.2*  PHOS 4.5 4.6 5.7*   Liver Function Tests: Recent Labs  Lab 07/23/18 0346 07/25/18 0500 07/28/18 0402  AST 94* 138*  --   ALT 114* 105*  --   ALKPHOS 125 119  --   BILITOT 1.0 1.0  --   PROT 8.8* 8.9*  --   ALBUMIN 2.1* 2.0* 1.6*   No results for input(s): LIPASE, AMYLASE in the last 168 hours. Recent Labs  Lab 07/25/18 0453  AMMONIA 23   CBC: Recent Labs  Lab 07/24/18 0850 07/25/18 0500 07/26/18 0335 07/27/18 0404 07/27/18 1905 07/28/18 0402  WBC 18.3* 18.6* 16.9* 15.4*  --  14.6*  NEUTROABS  --   --  12.9* 11.4*  --  11.4*  HGB 8.8* 8.9* 7.7* 6.3* 7.6* 8.1*  HCT 28.7* 30.3* 26.4* 20.6* 23.5* 25.4*  MCV 93.5 94.7 95.7 92.0  --  90.1  PLT 552* 551* 462* 404*  --  391   Cardiac Enzymes: Recent Labs  Lab 07/22/18 1644  CKTOTAL 61   CBG: Recent  Labs  Lab 07/27/18 1554 07/27/18 2011 07/28/18 0017 07/28/18 0349 07/28/18 0736  GLUCAP 173* 90 125* 112* 150*    Iron Studies:  Recent Labs    07/26/18 0335  IRON 11*  TIBC 195*  FERRITIN 370*   Studies/Results: Dg Chest Port 1 View  Result Date: 07/28/2018 CLINICAL DATA:  62 year old male in the ICU with perforated splenic flexure tumor, peritonitis, sepsis, respiratory failure. EXAM: PORTABLE CHEST 1 VIEW COMPARISON:  CT chest abdomen and pelvis 07/26/2018. Portable chest 07/27/2018 and earlier. FINDINGS: Portable AP semi upright view at 0535 hours. Stable endotracheal tube tip at the level of the clavicles. Stable enteric tube, side hole at the level of the proximal stomach. Stable right PICC line. Stable bilateral upper quadrant  percutaneous drainage catheters. Stable lung volumes and mediastinal contours. Persistent confluent left lung base opacity, increased since 07/25/2018. No pneumothorax or pulmonary edema. No confluent opacity in the right lung. Paucity bowel gas in the upper abdomen. IMPRESSION: 1.  Stable lines and tubes. 2. Stable ventilation with dense left lung base opacity compatible with combined pleural effusion and consolidation. Electronically Signed   By: Genevie Ann M.D.   On: 07/28/2018 08:02   Dg Chest Port 1 View  Result Date: 07/27/2018 CLINICAL DATA:  Respiratory failure. EXAM: PORTABLE CHEST 1 VIEW COMPARISON:  07/26/2018 and chest CT 07/26/2018 FINDINGS: Right-sided PICC line with tip at the cavoatrial junction. Nasogastric tube with tip and side-port over the stomach in the left upper quadrant. Endotracheal tube has tip approximately 4.9 cm above the carina. Left basilar pleural pigtail catheter unchanged. Lungs are hypoinflated with hazy opacification over the left lung and left base slightly worse likely layering effusion with associated basilar atelectasis. Infection in the left lung is possible. Cardiomediastinal silhouette and remainder of the exam is unchanged. IMPRESSION: Slight worsening hazy opacification over the left lung and lung base likely layering effusion with atelectasis, although infection is possible. Tubes and lines as described. Electronically Signed   By: Marin Olp M.D.   On: 07/27/2018 10:38   Dg Abd Portable 1v  Result Date: 07/26/2018 CLINICAL DATA:  OG tube placement EXAM: PORTABLE ABDOMEN - 1 VIEW COMPARISON:  CT 07/26/2018 FINDINGS: NG tube tip is in the proximal stomach with the side port near the GE junction. Left abdominal drainage catheter and left pleural pigtail catheter noted. Nonobstructive bowel gas pattern. IMPRESSION: NG tube tip in the proximal stomach. Electronically Signed   By: Rolm Baptise M.D.   On: 07/26/2018 22:01   Medications: Infusions: . sodium  chloride Stopped (07/28/18 0142)  . anidulafungin 100 mg (07/27/18 1600)  . dexmedetomidine (PRECEDEX) IV infusion 0.6 mcg/kg/hr (07/28/18 0821)  . fentaNYL infusion INTRAVENOUS 350 mcg/hr (07/28/18 0821)  . linezolid (ZYVOX) IV 600 mg (07/28/18 0829)  . piperacillin-tazobactam (ZOSYN)  IV Stopped (07/28/18 0553)  . TPN ADULT (ION)      Scheduled Medications: . sodium chloride   Intravenous Once  . chlorhexidine gluconate (MEDLINE KIT)  15 mL Mouth Rinse BID  . fentaNYL (SUBLIMAZE) injection  100 mcg Intravenous Once  . fentaNYL (SUBLIMAZE) injection  50 mcg Intravenous Once  . free water  300 mL Per Tube Q6H  . heparin injection (subcutaneous)  5,000 Units Subcutaneous Q8H  . insulin aspart  3-9 Units Subcutaneous Q4H  . insulin glargine  20 Units Subcutaneous Daily  . mouth rinse  15 mL Mouth Rinse 10 times per day  . sodium bicarbonate  100 mEq Intravenous Once  . sodium chloride flush  5 mL Intracatheter Q8H    have reviewed scheduled and prn medications.  Physical Exam: General: sedated on vent Heart: tachy  Lungs: mostly clear Abdomen: distended- intra abdom drains Extremities: no edema    07/28/2018,9:48 AM  LOS: 18 days

## 2018-07-28 NOTE — Progress Notes (Signed)
Pt placed back on full support due to RN being concerned for pts increased WOB and RR.  Pt placed back on full vent support.  Pt tolerated well.  RT will continue to monitor.

## 2018-07-28 NOTE — Progress Notes (Signed)
Patient continuously biting ETT making suctioning difficult and increasing Peak Inspiratory pressure. RT inserted bite block. Patient tolerating well at this time. RT able to pass suction catheter without difficulty. RN is aware.

## 2018-07-28 NOTE — Progress Notes (Signed)
Ostomy is fine.  Wound is stable.  No escalation of care.  On ventilator and anxious.  We will see patient again next week.  Kathryne Eriksson. Dahlia Bailiff, MD, [place] [phone] [facility]

## 2018-07-28 NOTE — Progress Notes (Signed)
PULMONARY / CRITICAL CARE MEDICINE   NAME:  Jerry Green, MRN:  [id number], DOB:  05/30/1956, LOS: 54 ADMISSION DATE:  07/16/2018, CONSULTATION DATE:  07/29/2018 REFERRING MD:  Jeanmarie Hubert, CHIEF COMPLAINT:  Abdominal pain  BRIEF HISTORY:    62 year old diabetic status post exploratory laparotomy 10/9 with finding of perforated sigmoid colon secondary to an obstructing carcinoma.  Pathology positive for invasive metastatic adenocarcinoma.  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 CT head 10/19 > no acute process. MRI brain 10/22 > no acute process. CT chest 10/24 >  CT A / P 10/24 >  BILATERAL pulmonary nodules likely metastases. Bibasilar atelectasis and small to moderate LEFT pleural effusion. Persistent intrahepatic abscess collection measuring 4.6 x 4.3 x 4.0 cm immediately anterior to the pigtail of the drainage catheter. Additional fluid collections at the LEFT gutter and potentially in pelvis, could be sterile or infected postoperative collections. Single enlarged LEFT para-aortic lymph node. CULTURES:  BC at OSH  10/8 MRSA PCR >> neg 19 blood cultures x2>>negative 10/24 Blood Culture>> Pending 10/24  Urine Culture>>. Negative  10/24 Tracheal Aspirate>>   ANTIBIOTICS:  OHS zosyn and cipro Zosyn 10/8 >  Anidulafungin 10/20 >  Daptomycin 10/20 > 10/24 Linezolid 10/25>>   LINES/TUBES:  10/9 ETT >>10/13 10/9 R DL IJ CVC >> ? Removal date 10/9 Wound vac >> ? Removal date RUE PICC 10/14 >  Colostomy 10/9 >  Abdominal abscess drains 10/18 >  07/25/2018 endotracheal tube>>  CONSULTANTS:  10/8>> CCS 10/9 PCCM 10/24 Palliative > no escalation/DNR  SUBJECTIVE:  Awake, follows commands, weans, renal function better, making urine   CONSTITUTIONAL: BP 114/66   Pulse 77   Temp 99.3 F (37.4 C)   Resp (!) 26   Ht 6' 2"  (1.88 m)   Wt (!) 153.8 kg   SpO2 99%   BMI 43.53 kg/m   I/O last 3 completed shifts: In: 9669.9 [I.V.:7218.1; Blood:485; Other:30; NG/GT:950; IV Piggyback:986.8] Out: 47867[Urine:2825; Emesis/NG output:1050; Drains:90; Stool:350]     Vent Mode: CPAP;PSV FiO2 (%):  [30 %] 30 % Set Rate:  [15 bmp] 15 bmp Vt Set:  [[672mL] 620 mL PEEP:  [5 cmH20] 5 cmH20 Pressure Support:  [12 cmH20] 12 cmH20 Plateau Pressure:  [15 cmH20-25 cmH20] 25 cmH20 PHYSICAL EXAM:  General:  In bed on vent HENT: NCAT ETT in place PULM: CTA B, vent supported breathing CV: RRR, no mgr GI: no bowel sounds, drains in place MSK: normal bulk and tone Neuro: awake on vent, follows some commands    ASSESSMENT AND PLAN   62year old diabetic status post exploratory laparotomy 10/9 with finding of perforated sigmoid colon secondary to an obstructing carcinoma.  Pathology positive for invasive metastatic adenocarcinoma. Transferred out of ICU 10/15, then PCCM called back PM 10/23  for AMS and tachypnea.  Required transfer to ICU for intubation.  Palliative met with family on 10/25, explained nature of his evolving multi-organ failure and code status was changed to DNR with no plans to escalate care.    Acute respiratory failure with hypoxemia Pulmonary Nodules >> suspected  mets  Plan Full mechanical vent support VAP prevention Daily WUA/SBT Don't escalate vent settings Fentanyl/precedex for comfort   Sepsis - due to abdominal abscesses +/- PNA Source control with liver and abdominal drains Persistent intrahepatic abscess collection measuring 4.6 x 4.3 x 4.0cm immediately anterior to the pigtail of the drainage catheter. Leukocytosis Plan - continue current antimicrobial therapy - continue linezolid for lung coverage - continue zosyn - continue anidulofungin - monitor drains  AKI> UOP better, labs about the sam Monitor BMET and UOP Replace electrolytes as needed Not a candidate for dialysis  Bowel obstruction with perforation - s/p ex lap 10/9. Plan - per surery  Metastatic adenocarcinoma (confirmed after ex lap with biopsy of colon and liver 10/9). Cannot receive cancer treatment right now  Code status: DNR, don't escalate  Acute metabolic encephalopathy - wean off fentanyl as able per PAD protocol - frequent orientation  Protein calorie malnutrition - continue TPN for now  DM2 - continue insulin as ordered sub cutaneous and in TPN  Anemia, not obviously bleeding - monitor for bleeding   SUMMARY OF TODAY'S PLAN:  Code status DNR, plan to meet again with family over weekend and withdraw care. As of this morning he is awake and alert with some interval improvement in urine output.  I think the best approach is to perform a one way extubation in the next 24-48 hours.   Best Practice / Goals of Care / Disposition.   DVT PROPHYLAXIS: Heparin. SUP: Pepcid. MOBILITY: Bedrest. CODE STATUS: DNR  Ongoing  goals of care discussions to align plan of care with best goals of care for patient.  Marland Kitchen  LABS  Glucose Recent Labs  Lab 07/27/18 1130 07/27/18 1554 07/27/18 2011 07/28/18 0017 07/28/18 0349 07/28/18 0736  GLUCAP 146* 173* 90 125* 112* 150*    BMET Recent Labs  Lab 07/26/18 0335 07/27/18 0404 07/28/18 0402  NA 148*  147* 143  K 3.9 3.2* 3.3*  CL 125* 113* 110  CO2 15* 23 22  BUN 98* 99* 99*  CREATININE 4.61* 5.17* 4.70*  GLUCOSE 248* 78 124*    Liver Enzymes Recent Labs  Lab 07/23/18 0346 07/25/18 0500 07/28/18 0402  AST 94* 138*  --   ALT 114* 105*  --   ALKPHOS 125 119  --   BILITOT 1.0 1.0  --   ALBUMIN 2.1* 2.0* 1.6*    Electrolytes Recent Labs  Lab 07/26/18 0335 07/27/18 0404 07/28/18 0402  CALCIUM 8.7* 8.2* 8.2*  MG 1.7 1.7 1.9  PHOS 4.5 4.6 5.7*    CBC Recent Labs  Lab 07/26/18 0335 07/27/18 0404 07/27/18 1905 07/28/18 0402  WBC 16.9* 15.4*  --  14.6*  HGB 7.7* 6.3* 7.6* 8.1*  HCT 26.4* 20.6* 23.5* 25.4*  PLT 462* 404*  --  391    ABG Recent Labs  Lab 07/25/18 1411 07/25/18 2026 07/26/18 0410  PHART 7.339* 7.182* 7.270*  PCO2ART BELOW REPORTABLE RANGE 38.2 34.9  PO2ART 97.0 292.0* 155*    Coag's No results for input(s): APTT, INR in the last 168 hours.  Sepsis Markers Recent Labs  Lab 07/26/18 0335 07/27/18 0404 07/28/18 0402  PROCALCITON 1.69 2.01 1.82    Cardiac Enzymes  No results for input(s): TROPONINI, PROBNP in the last 168 hours.  My cc time 31 minutes  Roselie Awkward, MD [place] [facility] Pager: [phone] Cell: [phone] After 3pm or if no response, call [phone]    07/28/2018, 8:42 AM

## 2018-07-29 ENCOUNTER — Inpatient Hospital Stay (HOSPITAL_COMMUNITY): Payer: Medicaid - Out of State

## 2018-07-29 DIAGNOSIS — Z66 Do not resuscitate: Secondary | ICD-10-CM

## 2018-07-29 LAB — RENAL FUNCTION PANEL
ANION GAP: 12 (ref 5–15)
Albumin: 1.4 g/dL — ABNORMAL LOW (ref 3.5–5.0)
BUN: 98 mg/dL — ABNORMAL HIGH (ref 8–23)
CALCIUM: 8.3 mg/dL — AB (ref 8.9–10.3)
CO2: 19 mmol/L — ABNORMAL LOW (ref 22–32)
Chloride: 111 mmol/L (ref 98–111)
Creatinine, Ser: 4.1 mg/dL — ABNORMAL HIGH (ref 0.61–1.24)
GFR calc non Af Amer: 14 mL/min — ABNORMAL LOW (ref >60)
GFR, EST AFRICAN AMERICAN: 16 mL/min — AB (ref >60)
Glucose, Bld: 208 mg/dL — ABNORMAL HIGH (ref 70–99)
PHOSPHORUS: 4.4 mg/dL (ref 2.5–4.6)
Potassium: 3.3 mmol/L — ABNORMAL LOW (ref 3.5–5.1)
Sodium: 142 mmol/L (ref 135–145)

## 2018-07-29 LAB — BASIC METABOLIC PANEL
ANION GAP: 12 (ref 5–15)
BUN: 101 mg/dL — ABNORMAL HIGH (ref 8–23)
CALCIUM: 8.6 mg/dL — AB (ref 8.9–10.3)
CO2: 20 mmol/L — ABNORMAL LOW (ref 22–32)
Chloride: 113 mmol/L — ABNORMAL HIGH (ref 98–111)
Creatinine, Ser: 4.16 mg/dL — ABNORMAL HIGH (ref 0.61–1.24)
GFR calc Af Amer: 16 mL/min — ABNORMAL LOW (ref >60)
GFR, EST NON AFRICAN AMERICAN: 14 mL/min — AB (ref >60)
GLUCOSE: 214 mg/dL — AB (ref 70–99)
POTASSIUM: 3.5 mmol/L (ref 3.5–5.1)
SODIUM: 145 mmol/L (ref 135–145)

## 2018-07-29 LAB — CBC WITH DIFFERENTIAL/PLATELET
Abs Immature Granulocytes: 0.07 10*3/uL (ref 0.00–0.07)
BASOS ABS: 0.1 10*3/uL (ref 0.0–0.1)
BASOS PCT: 1 %
EOS ABS: 0.3 10*3/uL (ref 0.0–0.5)
EOS PCT: 3 %
HEMATOCRIT: 24.3 % — AB (ref 39.0–52.0)
Hemoglobin: 7.7 g/dL — ABNORMAL LOW (ref 13.0–17.0)
IMMATURE GRANULOCYTES: 1 %
LYMPHS ABS: 1.1 10*3/uL (ref 0.7–4.0)
Lymphocytes Relative: 9 %
MCH: 28.7 pg (ref 26.0–34.0)
MCHC: 31.7 g/dL (ref 30.0–36.0)
MCV: 90.7 fL (ref 80.0–100.0)
Monocytes Absolute: 0.7 10*3/uL (ref 0.1–1.0)
Monocytes Relative: 6 %
NEUTROS PCT: 80 %
NRBC: 0 % (ref 0.0–0.2)
Neutro Abs: 9.4 10*3/uL — ABNORMAL HIGH (ref 1.7–7.7)
PLATELETS: 411 10*3/uL — AB (ref 150–400)
RBC: 2.68 MIL/uL — ABNORMAL LOW (ref 4.22–5.81)
RDW: 14.6 % (ref 11.5–15.5)
WBC: 11.6 10*3/uL — ABNORMAL HIGH (ref 4.0–10.5)

## 2018-07-29 LAB — GLUCOSE, CAPILLARY
GLUCOSE-CAPILLARY: 193 mg/dL — AB (ref 70–99)
GLUCOSE-CAPILLARY: 184 mg/dL — AB (ref 70–99)
Glucose-Capillary: 193 mg/dL — ABNORMAL HIGH (ref 70–99)
Glucose-Capillary: 196 mg/dL — ABNORMAL HIGH (ref 70–99)
Glucose-Capillary: 294 mg/dL — ABNORMAL HIGH (ref 70–99)
Glucose-Capillary: 295 mg/dL — ABNORMAL HIGH (ref 70–99)
Glucose-Capillary: 360 mg/dL — ABNORMAL HIGH (ref 70–99)

## 2018-07-29 LAB — CULTURE, RESPIRATORY: CULTURE: NORMAL

## 2018-07-29 LAB — CULTURE, RESPIRATORY W GRAM STAIN

## 2018-07-29 LAB — PHOSPHORUS: PHOSPHORUS: 4.4 mg/dL (ref 2.5–4.6)

## 2018-07-29 LAB — MAGNESIUM: Magnesium: 2 mg/dL (ref 1.7–2.4)

## 2018-07-29 MED ORDER — INSULIN ASPART 100 UNIT/ML ~~LOC~~ SOLN
0.0000 [IU] | SUBCUTANEOUS | Status: DC
  Administered 2018-07-29: 11 [IU] via SUBCUTANEOUS
  Administered 2018-07-29: 20 [IU] via SUBCUTANEOUS
  Administered 2018-07-30: 15 [IU] via SUBCUTANEOUS
  Administered 2018-07-30: 7 [IU] via SUBCUTANEOUS
  Administered 2018-07-30 (×2): 11 [IU] via SUBCUTANEOUS
  Administered 2018-07-30: 4 [IU] via SUBCUTANEOUS
  Administered 2018-07-31 (×4): 7 [IU] via SUBCUTANEOUS
  Administered 2018-07-31 – 2018-08-01 (×4): 4 [IU] via SUBCUTANEOUS
  Administered 2018-08-01: 11 [IU] via SUBCUTANEOUS
  Administered 2018-08-01 (×2): 4 [IU] via SUBCUTANEOUS
  Administered 2018-08-02: 3 [IU] via SUBCUTANEOUS
  Administered 2018-08-02: 4 [IU] via SUBCUTANEOUS
  Administered 2018-08-02: 7 [IU] via SUBCUTANEOUS
  Administered 2018-08-02: 4 [IU] via SUBCUTANEOUS
  Administered 2018-08-03: 3 [IU] via SUBCUTANEOUS
  Administered 2018-08-03 (×2): 4 [IU] via SUBCUTANEOUS
  Administered 2018-08-03 (×2): 3 [IU] via SUBCUTANEOUS
  Administered 2018-08-04 (×3): 4 [IU] via SUBCUTANEOUS
  Administered 2018-08-04 – 2018-08-05 (×3): 3 [IU] via SUBCUTANEOUS
  Administered 2018-08-05: 4 [IU] via SUBCUTANEOUS
  Administered 2018-08-05: 3 [IU] via SUBCUTANEOUS

## 2018-07-29 MED ORDER — MORPHINE SULFATE (PF) 2 MG/ML IV SOLN
2.0000 mg | Freq: Once | INTRAVENOUS | Status: AC
  Administered 2018-07-29: 2 mg via INTRAVENOUS
  Filled 2018-07-29: qty 1

## 2018-07-29 MED ORDER — FENTANYL 2500MCG IN NS 250ML (10MCG/ML) PREMIX INFUSION
0.0000 ug/h | INTRAVENOUS | Status: DC
  Administered 2018-07-29: 300 ug/h via INTRAVENOUS
  Administered 2018-07-29: 5 ug/h via INTRAVENOUS
  Administered 2018-07-30 (×3): 300 ug/h via INTRAVENOUS
  Administered 2018-07-31: 150 ug/h via INTRAVENOUS
  Filled 2018-07-29 (×6): qty 250

## 2018-07-29 MED ORDER — SODIUM CHLORIDE 0.9 % IV SOLN
INTRAVENOUS | Status: DC | PRN
  Administered 2018-07-31: 250 mL via INTRAVENOUS

## 2018-07-29 MED ORDER — MIDAZOLAM HCL 2 MG/2ML IJ SOLN
4.0000 mg | Freq: Once | INTRAMUSCULAR | Status: AC
  Administered 2018-07-29: 4 mg via INTRAVENOUS
  Filled 2018-07-29: qty 4

## 2018-07-29 MED ORDER — FENTANYL CITRATE (PF) 100 MCG/2ML IJ SOLN
12.5000 ug | INTRAMUSCULAR | Status: DC | PRN
  Administered 2018-07-29: 25 ug via INTRAVENOUS
  Filled 2018-07-29: qty 2

## 2018-07-29 MED ORDER — IPRATROPIUM-ALBUTEROL 0.5-2.5 (3) MG/3ML IN SOLN
3.0000 mL | Freq: Four times a day (QID) | RESPIRATORY_TRACT | Status: DC
  Administered 2018-07-29 – 2018-07-30 (×6): 3 mL via RESPIRATORY_TRACT
  Filled 2018-07-29 (×4): qty 3

## 2018-07-29 MED ORDER — RACEPINEPHRINE HCL 2.25 % IN NEBU
0.5000 mL | INHALATION_SOLUTION | Freq: Once | RESPIRATORY_TRACT | Status: AC
  Administered 2018-07-29: 0.5 mL via RESPIRATORY_TRACT
  Filled 2018-07-29: qty 0.5

## 2018-07-29 MED ORDER — DEXAMETHASONE SODIUM PHOSPHATE 10 MG/ML IJ SOLN: 4.0000 mg | Freq: Four times a day (QID) | INTRAMUSCULAR | Status: DC

## 2018-07-29 MED ORDER — DEXAMETHASONE SODIUM PHOSPHATE 10 MG/ML IJ SOLN
4.0000 mg | Freq: Four times a day (QID) | INTRAMUSCULAR | Status: DC
  Administered 2018-07-29 – 2018-07-31 (×7): 4 mg via INTRAVENOUS
  Filled 2018-07-29 (×7): qty 1

## 2018-07-29 MED ORDER — TRAVASOL 10 % IV SOLN
INTRAVENOUS | Status: AC
  Administered 2018-07-29: 17:00:00 via INTRAVENOUS
  Filled 2018-07-29: qty 1699.2

## 2018-07-29 MED ORDER — INSULIN GLARGINE 100 UNIT/ML ~~LOC~~ SOLN
25.0000 [IU] | Freq: Every day | SUBCUTANEOUS | Status: DC
  Administered 2018-07-29 – 2018-08-05 (×8): 25 [IU] via SUBCUTANEOUS
  Filled 2018-07-29 (×9): qty 0.25

## 2018-07-29 MED ORDER — ALBUTEROL (5 MG/ML) CONTINUOUS INHALATION SOLN
10.0000 mg/h | INHALATION_SOLUTION | RESPIRATORY_TRACT | Status: AC
  Administered 2018-07-29: 10 mg/h via RESPIRATORY_TRACT
  Filled 2018-07-29: qty 20

## 2018-07-29 MED ORDER — HYDRALAZINE HCL 20 MG/ML IJ SOLN
5.0000 mg | INTRAMUSCULAR | Status: DC | PRN
  Administered 2018-07-29: 5 mg via INTRAVENOUS
  Filled 2018-07-29: qty 1

## 2018-07-29 MED ORDER — FUROSEMIDE 10 MG/ML IJ SOLN
40.0000 mg | Freq: Four times a day (QID) | INTRAMUSCULAR | Status: AC
  Administered 2018-07-29 (×2): 40 mg via INTRAVENOUS
  Filled 2018-07-29 (×2): qty 4

## 2018-07-29 MED ORDER — ACETAMINOPHEN 10 MG/ML IV SOLN
1000.0000 mg | Freq: Once | INTRAVENOUS | Status: AC
  Administered 2018-07-29: 1000 mg via INTRAVENOUS
  Filled 2018-07-29: qty 100

## 2018-07-29 MED ORDER — FENTANYL BOLUS VIA INFUSION
50.0000 ug | INTRAVENOUS | Status: DC | PRN
  Administered 2018-07-29 (×3): 200 ug via INTRAVENOUS
  Administered 2018-07-29: 50 ug via INTRAVENOUS
  Administered 2018-07-29 – 2018-07-30 (×6): 200 ug via INTRAVENOUS
  Filled 2018-07-29: qty 200

## 2018-07-29 NOTE — Progress Notes (Signed)
Multiple failed attempts to reach family to make aware medications started for comfort.  Rien, Marland Daughter   [phone]  [place], Arp Brother   [phone]   Update 7pm: Both daughter and brother called back, update given.

## 2018-07-29 NOTE — Progress Notes (Signed)
patient extubated per MD's order, placed on 3LNC, SATS 99%, RR 25 BPM, no stridor heard, will continue to monitor patient.

## 2018-07-29 NOTE — Progress Notes (Signed)
   07/29/18 1200  Clinical Encounter Type  Visited With Patient  Visit Type Follow-up  Spiritual Encounters  Spiritual Needs Emotional  Was making rounds and per nurse I visited with patient to provide spiritual and emotional support. Patient was being extubated and was trying to speak. Provided the ministry of presence and social support.

## 2018-07-29 NOTE — Progress Notes (Signed)
   07/29/18 1833  Vitals  Pulse Rate (!) 140  ECG Heart Rate (!) 141  Resp (!) 77  Oxygen Therapy  SpO2 100 %  Paged provider to make aware. Verbal order given for fentanyl continuous drip, bolus and Versed push.

## 2018-07-29 NOTE — Progress Notes (Signed)
[facility]  I spoke to Jerry Green on the phone and explained the sitaution.  She said that while she had not had an extended family meeting that she was able to speak to one of her father's brothers about the situation.  Understanding the advanced nature of his cancer and his overall poor prognosis she wishes for use to focus on her comfort.  On repeat exam:   Still wheezing, still has some respiratory distress, confused.  O2 saturation remains normal, tachycardic.  Had some transient response to albuterol so will re-order continuous neb for 1 hour. If still dyspneic after that then start BIPAP If intolerant of BIPAP> morphine infusion, see discussion above  additional cc time 25 minutes  Jerry Awkward, MD [place] [facility] Pager: [phone] Cell: [phone] After 3pm or if no response, call [phone]

## 2018-07-29 NOTE — Progress Notes (Signed)
PULMONARY / CRITICAL CARE MEDICINE   NAME:  Jerry Green, MRN:  [id number], DOB:  03-03-1956, LOS: 79 ADMISSION DATE:  07/17/2018, CONSULTATION DATE:  07/19/2018 REFERRING MD:  Jeanmarie Hubert, CHIEF COMPLAINT:  Abdominal pain  BRIEF HISTORY:    62 year old diabetic status post exploratory laparotomy 10/9 with finding of perforated sigmoid colon secondary to an obstructing carcinoma.  Pathology positive for invasive metastatic adenocarcinoma.  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  S/p R abdominal colostomy with long segment hartmannas pouch, LLL airspace consolidation and small pleural effusion, pulm nodule in LUL measuring 70m, few scattered peritoneal nodules are identified. CT head 10/19 > no acute process. MRI brain 10/22 > no acute process. CT chest 10/24 >  CT A / P 10/24 >  BILATERAL pulmonary nodules likely metastases. Bibasilar atelectasis and small to moderate LEFT pleural effusion. Persistent intrahepatic abscess collection measuring 4.6 x 4.3 x 4.0 cm immediately anterior to the pigtail of the drainage catheter. Additional fluid collections at the LEFT gutter and potentially in pelvis, could be sterile or infected postoperative collections. Single enlarged LEFT para-aortic lymph node. CULTURES:  BC at OSH  10/8 MRSA PCR >> neg 19 blood cultures  x2>>negative 10/24 Blood Culture>> Pending 10/24 Urine Culture>>. Negative  10/24 Tracheal Aspirate>>   ANTIBIOTICS:  OHS zosyn and cipro Zosyn 10/8 >  Anidulafungin 10/20 >  Daptomycin 10/20 > 10/24 Linezolid 10/25>>   LINES/TUBES:  10/9 ETT >>10/13 10/9 R DL IJ CVC >> ? Removal date 10/9 Wound vac >> ? Removal date RUE PICC 10/14 >  Colostomy 10/9 >  Abdominal abscess drains 10/18 >  07/25/2018 endotracheal tube>>  CONSULTANTS:  10/8>> CCS 10/9 PCCM 10/24 Palliative > no escalation/DNR  SUBJECTIVE:  Awake, follow commands, making urine   CONSTITUTIONAL: BP 134/73   Pulse 78   Temp (!) 100.6 F (38.1 C) (Core)   Resp (!) 21   Ht 6' 2"  (1.88 m)   Wt (!) 153.8 kg   SpO2 99%   BMI 43.53 kg/m   I/O last 3 completed shifts: In: 7843.2 [I.V.:6131.4; Other:30; NG/GT:300; IV Piggyback:1381.7] Out: 89379[Urine:7195; Emesis/NG output:1050; Drains:72; Stool:55]     Vent Mode: PSV FiO2 (%):  [30 %] 30 % Set Rate:  [15 bmp-16 bmp] 16 bmp Vt Set:  [500 mL-620 mL] 620 mL PEEP:  [5 cmH20] 5 cmH20 Pressure Support:  [10 cmH20-12 cmH20] 10 cmH20 Plateau Pressure:  [22 cmH20-23 cmH20] 23 cmH20 PHYSICAL EXAM:  General:  In bed on vent HENT: NCAT ETT in place PULM: CTA B, vent supported breathing CV: RRR, no mgr GI: no bowel sounds, drains in place, abdominal binder in place MSK: normal bulk and tone Neuro: awake on vent, nods head, keeps eyes open    ASSESSMENT AND PLAN   62year old diabetic status post exploratory laparotomy 10/9 with finding of perforated sigmoid colon secondary to an obstructing carcinoma.  Pathology positive for  invasive metastatic adenocarcinoma. Transferred out of ICU 10/15, then PCCM called back PM 10/23 for AMS and tachypnea.  Required transfer to ICU for intubation.  Palliative met with family on 10/25, explained nature of his evolving multi-organ failure and code status was changed to DNR with no plans to escalate care.    Acute  respiratory failure with hypoxemia Pulmonary Nodules >>suspected mets  Plan WUA/SBT now VAP prevention Plan to extubate  Sepsis - due to abdominal abscesses +/- PNA Source control with liver and abdominal drains Persistent intrahepatic abscess collection measuring 4.6 x 4.3 x 4.0cm immediately anterior to the pigtail of the drainage catheter. Bowel obstruction, wound binder in place Plan - continue current antimicrobial therapy - continue linezolid, zosyn, anidulofungin - monitor drain output - wound care per surgery  AKI> UOP better, labs about the same Monitor BMET and UOP Replace electrolytes as needed Not a dialysis candidate   Metastatic adenocarcinoma (confirmed after ex lap with biopsy of colon and liver 10/9). Currently not a candidate for cancer treatment given severe infection and multi-organ failure  Code status:  DNR, don't escalate   Acute metabolic encephalopathy - wean off fentanyl  - stop precedex  Protein calorie malnutrition - continue TPN  DM2 - continue SSI  Anemia, not obviously bleeding - monitor bleeding   SUMMARY OF TODAY'S PLAN:  Plan one way extubation today when he is more awake Code status DNR   Best Practice / Goals of Care / Disposition.   DVT PROPHYLAXIS: Heparin. SUP: Pepcid. MOBILITY: Bedrest. CODE STATUS: DNR  Ongoing  goals of care discussions to align plan of care with best goals of care for patient.  Marland Kitchen  LABS  Glucose Recent Labs  Lab 07/28/18 1310 07/28/18 1551 07/28/18 2025 07/28/18 2331 07/29/18 0339 07/29/18 0837  GLUCAP 151* 143* 187* 235* 193* 196*    BMET Recent Labs  Lab 07/27/18 0404 07/28/18 0402 07/29/18 0347  NA 147* 143 145  142  K 3.2* 3.3* 3.5  3.3*  CL 113* 110 113*  111  CO2 23 22 20*  19*  BUN 99* 99* 101*  98*  CREATININE 5.17* 4.70* 4.16*  4.10*  GLUCOSE 78 124* 214*  208*    Liver Enzymes Recent Labs  Lab 07/23/18 0346 07/25/18 0500 07/28/18 0402 07/29/18 0347  AST  94* 138*  --   --   ALT 114* 105*  --   --   ALKPHOS 125 119  --   --   BILITOT 1.0 1.0  --   --   ALBUMIN 2.1* 2.0* 1.6* 1.4*    Electrolytes Recent Labs  Lab 07/27/18 0404 07/28/18 0402 07/29/18 0347  CALCIUM 8.2* 8.2* 8.6*  8.3*  MG 1.7 1.9 2.0  PHOS 4.6 5.7* 4.4  4.4    CBC Recent Labs  Lab 07/27/18 0404 07/27/18 1905 07/28/18 0402 07/29/18 0347  WBC 15.4*  --  14.6* 11.6*  HGB 6.3* 7.6* 8.1* 7.7*  HCT 20.6* 23.5* 25.4* 24.3*  PLT 404*  --  391 411*    ABG Recent Labs  Lab 07/25/18 1411 07/25/18 2026 07/26/18 0410  PHART 7.339* 7.182* 7.270*  PCO2ART BELOW REPORTABLE RANGE 38.2 34.9  PO2ART 97.0 292.0* 155*    Coag's No results for input(s): APTT, INR in the last 168 hours.  Sepsis Markers Recent Labs  Lab 07/26/18 0335 07/27/18 0404 07/28/18 0402  PROCALCITON 1.69 2.01 1.82    Cardiac Enzymes No results for input(s): TROPONINI, PROBNP in the last 168 hours.  My  cc time 33 minutes  Roselie Awkward, MD [place] [facility] Pager: [phone] Cell: [phone] After 3pm or if no response, call [phone]    07/29/2018, 9:38 AM

## 2018-07-29 NOTE — Progress Notes (Signed)
[place] NOTE   Pharmacy Consult for TPN Indication: prolonged ileus  Patient Measurements: Height: 6\' 2"  (188 cm) Weight: (!) 339 lb (153.8 kg) IBW/kg (Calculated) : 82.2 TPN AdjBW (KG): 86.1 Body mass index is 43.53 kg/m.   Assessment:  46 yom transferred from OSH with abdominal pain, N/V and found to have an acute perforated/obstructing large carcinoma in the colon with multiple metastases. S/p OR 10/9 for exlap and liver biopsy (positive) with colectomy. Attempted TF but with almost immediate vomiting. Started TPN for nutrition support with post-op ileus. Pt is at risk for refeeding.  Poor prognosis for survival, family conference with Palliative Care ongoing.  GI: Pre-albumin up to WNL 16.9. S/p colectomy with going ileus - 28mL drain OP, 970mL NG OP. Stool OP -173ml - 10/18: Perisplenic abscess drain, Hepatic abscess drain. Famotidine in TPN - New diagnosis of metastatic colon cancer  Endo: Hx DM (Amaryl, metformin PTA, Not on insulin PTA) - CBGs dropped - required holding Lantus and removing TPN insulin.  Within the last 24hr. CBGs have been > 200 on sliding scale insulin and Lantus 20 units.   Lytes: K 3.5, Na stable 145, corr Ca~10.3, CO2 22, Mag. 2.0, phos 4.4. Minimal lytes in TPN Renal: AKI - SCr trending back down (4.16)(baseline~1), BUN up to 101, UOP 0.6 cc/kg/hr. Off Bicarb drip - Free h20 300/q6 Pulm: extubated 10/13 to RA, intubated 10/24 Cards: h/o HTN:  VSS Hepatobil: LFTs moderately elevated (AST 138 / ALT 105), tbili down to WNL, TG up to 316 Neuro: Transient confusion. Fent/Midazolam prn ID: Tmax [id number]; Zosyn postop for gross contamination with stool perforated colon, now Dapto / Eraxis, BCx/liver abscess cx - negative   TPN Access: CVC TPN start date: 07/16/18 Nutritional Goals (per RD rec on 10/24): KCal: 1610 - 1805 KCal Protein: 160-200 g  Fluid: >/= 2L/day  Goal TPN rate is 100 ml/hr (provides 170g protein and  1700 KCal - 100% goal)  Current Nutrition:  Clear liquid TPN  Plan:  - TPN at 100 mL/hr - TPN provides 170 g of protein, 141 g of dextrose, and 60 g of lipids which provides 1700 kCals per day, meeting 100% of goal kcal and goal AA - Electrolytes in TPN: Continue small amount of potassium and magnesium in TPN - Continue SSI at resistant regimen, inc. Lantus to 25 units daily and add 20 units to TPN. Add daily MVI, trace elements, Pepcid 20 mg to TPN - Monitor TPN labs, triglyceride trend  Rober Minion, PharmD., MS Clinical Pharmacist Pager:  [phone] Thank you for allowing pharmacy to be part of this patients care team. 07/29/2018, 6:56 AM

## 2018-07-29 NOTE — Progress Notes (Signed)
[facility] Progress Note Patient Name: Jerry Green DOB: 1956/03/21 MRN: [id number]   Date of Service  07/29/2018  HPI/Events of Note  Blood glucose = 294.   eICU Interventions  Will change to Q 4 hour resistant Novolog SSI.     Intervention Category Major Interventions: Hyperglycemia - active titration of insulin therapy  Lysle Dingwall 07/29/2018, 8:41 PM

## 2018-07-29 NOTE — Progress Notes (Signed)
[facility] PCCM  Called emergently to the bedside to assess for respiratory distress and fever. Per nursing he was stable for several hours after extubation bu developed fever and the sudden onset of dyspnea.  On exam No stridor but he has a loud wheeze heard all throughout lung fields Increased work of breathing, accessory muscle use noted He is confused  Impression/plan Acute respiratory distress: sounds like sudden onset bronchospasm, doubt upper airway narrowing but will treat for both > duoneb now > CXR to eval for pulm edema > lasix IV now and q6h x2  > morphine 2mg  IV now for relief of work of breathing > continue Oxygen to maintain O2 saturation > 90%  I called [name] his daughter but had to leave a message. I then called his brother Al to let him know that we are treating him as outlined above but that if he does not improve we will need to consider full comfort measures.  He understood and told me he would update the family about the change in his status.  Additional CC time 30 minutes  Roselie Awkward, MD [place] P[facility]M Pager: [phone] Cell: [phone] After 3pm or if no response, call [phone]

## 2018-07-29 NOTE — Progress Notes (Signed)
Paged general surgery to make aware patient is to be extubated and currently has OG tube in place. Per Dr.Wilson okay for nurse to place NG tube post extubation.

## 2018-07-29 NOTE — Progress Notes (Signed)
Fentanyl 46mls wasted with Mickel Baas RN/CN

## 2018-07-29 NOTE — Progress Notes (Signed)
Placed patient on BiPAP per MD's order. 

## 2018-07-29 NOTE — Progress Notes (Signed)
[facility] Progress Note Patient Name: Jerry Green DOB: 08/15/1956 MRN: [id number]   Date of Service  07/29/2018  HPI/Events of Note  Pain and increased WOB - Currently on a Fentanyl IV infusion at 200 mcg/hour.   eICU Interventions  Will order: 1. Increase Fentanyl IV infusion ceiling to 300 mcg/hour.      Intervention Category Intermediate Interventions: Pain - evaluation and management;Respiratory distress - evaluation and management  Lysle Dingwall 07/29/2018, 10:49 PM

## 2018-07-30 ENCOUNTER — Encounter (HOSPITAL_COMMUNITY): Payer: Self-pay | Admitting: General Practice

## 2018-07-30 DIAGNOSIS — Z515 Encounter for palliative care: Secondary | ICD-10-CM

## 2018-07-30 DIAGNOSIS — R188 Other ascites: Secondary | ICD-10-CM

## 2018-07-30 DIAGNOSIS — C799 Secondary malignant neoplasm of unspecified site: Secondary | ICD-10-CM

## 2018-07-30 DIAGNOSIS — J9601 Acute respiratory failure with hypoxia: Secondary | ICD-10-CM

## 2018-07-30 DIAGNOSIS — C787 Secondary malignant neoplasm of liver and intrahepatic bile duct: Secondary | ICD-10-CM

## 2018-07-30 LAB — RENAL FUNCTION PANEL
Albumin: 1.7 g/dL — ABNORMAL LOW (ref 3.5–5.0)
Anion gap: 10 (ref 5–15)
BUN: 100 mg/dL — AB (ref 8–23)
CO2: 18 mmol/L — AB (ref 22–32)
Calcium: 8.9 mg/dL (ref 8.9–10.3)
Chloride: 115 mmol/L — ABNORMAL HIGH (ref 98–111)
Creatinine, Ser: 3.92 mg/dL — ABNORMAL HIGH (ref 0.61–1.24)
GFR calc Af Amer: 17 mL/min — ABNORMAL LOW (ref >60)
GFR calc non Af Amer: 15 mL/min — ABNORMAL LOW (ref >60)
GLUCOSE: 328 mg/dL — AB (ref 70–99)
POTASSIUM: 3.5 mmol/L (ref 3.5–5.1)
Phosphorus: 2.4 mg/dL — ABNORMAL LOW (ref 2.5–4.6)
SODIUM: 143 mmol/L (ref 135–145)

## 2018-07-30 LAB — GLUCOSE, CAPILLARY
GLUCOSE-CAPILLARY: 223 mg/dL — AB (ref 70–99)
Glucose-Capillary: 315 mg/dL — ABNORMAL HIGH (ref 70–99)
Glucose-Capillary: 289 mg/dL — ABNORMAL HIGH (ref 70–99)
Glucose-Capillary: 251 mg/dL — ABNORMAL HIGH (ref 70–99)
Glucose-Capillary: 173 mg/dL — ABNORMAL HIGH (ref 70–99)

## 2018-07-30 LAB — TRIGLYCERIDES: Triglycerides: 236 mg/dL — ABNORMAL HIGH (ref <150)

## 2018-07-30 LAB — PREALBUMIN: PREALBUMIN: 12.3 mg/dL — AB (ref 18–38)

## 2018-07-30 MED ORDER — IPRATROPIUM-ALBUTEROL 0.5-2.5 (3) MG/3ML IN SOLN
3.0000 mL | Freq: Three times a day (TID) | RESPIRATORY_TRACT | Status: DC
  Administered 2018-07-31 – 2018-08-01 (×4): 3 mL via RESPIRATORY_TRACT
  Filled 2018-07-30 (×4): qty 3

## 2018-07-30 MED ORDER — TRAVASOL 10 % IV SOLN
INTRAVENOUS | Status: AC
  Administered 2018-07-30: 17:00:00 via INTRAVENOUS
  Filled 2018-07-30: qty 1699.2

## 2018-07-30 NOTE — Progress Notes (Signed)
[place] NOTE   Pharmacy Consult for TPN Indication: prolonged ileus  Patient Measurements: Height: 6\' 2"  (188 cm) Weight: (!) 339 lb (153.8 kg) IBW/kg (Calculated) : 82.2 TPN AdjBW (KG): 86.1 Body mass index is 43.53 kg/m.   Assessment:  37 yom transferred from OSH with abdominal pain, N/V and found to have an acute perforated/obstructing large carcinoma in the colon with multiple metastases. S/p OR 10/9 for exlap and liver biopsy (positive) with colectomy. Attempted TF but with almost immediate vomiting. Started TPN for nutrition support with post-op ileus. Pt is at risk for refeeding.  Poor prognosis for survival, family conference with Palliative Care ongoing.  GI: Pre-albumin up to WNL 16.9. S/p colectomy with going ileus - 52mL drain OP, 440mL NG OP. Stool OP -517ml - 10/18: Perisplenic abscess drain, Hepatic abscess drain. Famotidine in TPN - New diagnosis of metastatic colon cancer  Endo: Hx DM (No insulin PTA) CBGs remain uncontrolled (180-360s). Had a low on 10/25 which required TPN to be stopped due to a large amount of insulin in the bag. Has 20 units in the bag, Lantus 25 units, and on resistant SSI Insulin requirements over past 24 hrs: 109 units Lytes: wnl today exc K borderline low, Cl 115, HCO3 18, CoCa 10.7, Phos down to 2.4.  Minimal lytes in TPN Renal: AKI - SCr trending back down to 3.92 (baseline~1), BUN up to 100, UOP ok. Off Bicarb drip and free water Pulm: extubated 10/13 to RA, intubated 10/24 Cards: h/o HTN:  VSS Hepatobil: LFTs moderately elevated (AST 138 / ALT 105), tbili down to WNL, TG elevated but down to 236 Neuro: Transient confusion. Fent/Midazolam prn ID: Tmax [id number]; Zosyn postop for gross contamination with stool perforated colon, now Dapto / Eraxis, BCx/liver abscess cx - negative   TPN Access: CVC TPN start date: 07/16/18 Nutritional Goals (per RD rec on 10/24): KCal: 3254 - 1805 KCal Protein: 160-200  g  Fluid: >/= 2L/day  Goal TPN rate is 100 ml/hr (provides 170g protein and 1700 KCal - 100% goal)  Current Nutrition:  TPN  Plan:  Continue TPN at 100 mL/hr TPN provides 170 g of protein, 141 g of dextrose, and 48 g of lipids which provides 1,640 kCals per day, meeting 100% of goal kcal and goal AA  Electrolytes in TPN: Continue low concentrations of K, Phos, and Mg. Cl:Ac Max acetate Add daily MVI, trace elements, Pepcid 20 mg and increase to 60 units of regular insulin to TPN Continue resistant SSI and adjust as needed Continue Lantus 25 units daily Monitor TPN labs, triglyceride trend, renal function panel and Mg today  No further escalation of care and leaning more towards comfort but still treating infection. Follow up indication for TPN at this point  Elenor Quinones, PharmD, [place] Pharmacist Phone number [phone] 07/30/2018 11:07 AM

## 2018-07-30 NOTE — Progress Notes (Signed)
Daily Progress Note   Patient Name: Jerry Green       Date: 07/30/2018 DOB: [birth year]  Age: 62 y.o. MRN#: [id number] Attending Physician: Juanito Doom, MD Primary Care Physician: Ollen Bowl, MD Admit Date: 07/23/2018  Reason for Consultation/Follow-up: Establishing goals of care  Subjective: Patient opens eyes to voice. Nods head no to pain. Appears comfortable on BiPAP and fentanyl gtt.   GOC:  Voicemail left for patient's daughter, Jerry Green. Awaiting return call.   Spoke with patient's brother, Jerry Green outside of the room. 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 Jerry Green recalls his conversation with Dr. [place] Bells yesterday and confirms understanding of diagnoses, interventions, and poor prognosis. He understands his brother is too sick for further oncology workup or chemo/radiation. 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 Jerry Green asks about next steps. Explained no further escalation of care due to metastatic, terminal cancer. Discussed shift to comfort when Jerry Green is ready, including transition off BiPAP to nasal cannula and continued medications for symptom management. Educated on this allowing a comfortable, dignified end-of-life. Also allowing him clear liquids if he can tolerate and will accept. Briefly mentioned transition to hospice facility if he remained stable for transfer, but that he may pass in the hospital. Again explained timing when Jerry Green and family are ready. Jerry Green understands and appreciative of information. Answered all questions and concerns.    Length of Stay: 20  Current Medications: Scheduled Meds:  . chlorhexidine gluconate (MEDLINE KIT)  15 mL Mouth Rinse BID  . dexamethasone  4 mg  Intravenous Q6H  . heparin injection (subcutaneous)  5,000 Units Subcutaneous Q8H  . insulin aspart  0-20 Units Subcutaneous Q4H  . insulin glargine  25 Units Subcutaneous Daily  . ipratropium-albuterol  3 mL Nebulization Q6H  . sodium bicarbonate  100 mEq Intravenous Once    Continuous Infusions: . sodium chloride Stopped (07/29/18 1541)  . sodium chloride    . anidulafungin Stopped (07/29/18 1627)  . fentaNYL infusion INTRAVENOUS 300 mcg/hr (07/30/18 0800)  . linezolid (ZYVOX) IV 600 mg (07/30/18 1007)  . piperacillin-tazobactam (ZOSYN)  IV 3.375 g (07/30/18 1120)  . TPN ADULT (ION) 100 mL/hr at 07/30/18 0800  . TPN ADULT (ION)  PRN Meds: sodium chloride, sodium chloride, acetaminophen (TYLENOL) oral liquid 160 mg/5 mL, fentaNYL, hydrALAZINE, sodium chloride flush  Physical Exam  Constitutional: He is easily aroused. He appears ill.  HENT:  Head: Normocephalic and atraumatic.  Cardiovascular: Regular rhythm.  Pulmonary/Chest: No accessory muscle usage. No tachypnea. No respiratory distress.  BiPAP  Abdominal: There is no tenderness.  Abdominal binder  Neurological: He is easily aroused.  Drowsy, will follow some commands  Skin: Skin is warm and dry.  Psychiatric: His speech is delayed. Cognition and memory are impaired. He is inattentive.  Nursing note and vitals reviewed.           Vital Signs: BP 132/80   Pulse (!) 101   Temp 99.3 F (37.4 C)   Resp 19   Ht 6' 2"  (1.88 m)   Wt (!) 153.8 kg   SpO2 100%   BMI 43.53 kg/m  SpO2: SpO2: 100 % O2 Device: O2 Device: Bi-PAP O2 Flow Rate: O2 Flow Rate (L/min): 3 L/min  Intake/output summary:   Intake/Output Summary (Last 24 hours) at 07/30/2018 1204 Last data filed at 07/30/2018 0800 Gross per 24 hour  Intake 3245.55 ml  Output 6244 ml  Net -2998.45 ml   LBM: Last BM Date: 07/29/18 Baseline Weight: Weight: 120.4 kg Most recent weight: Weight: (!) 153.8 kg       Palliative Assessment/Data: PPS  20%     Patient Active Problem List   Diagnosis Date Noted  . Liver metastases ([place])   . Metastatic cancer ([place])   . DNR (do not resuscitate)   . Palliative care encounter   . Abdominal fluid collection   . Fever   . Acute respiratory failure with hypoxia ([place])   . Perforated colon cancer of splenic flexure s/p colectomy/colostomy 07/05/2018 07/19/2018  . 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 Ileus, postoperative ([place]) 07/19/2018  . 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 Cancer of splenic flexure of colon pT4b, pN1b M1   . Bowel obstruction ([place]) 07/27/2018  . Peritoneal carcinomatosis ([place]) 07/13/2018  . Essential hypertension 07/25/2018  . GERD (gastroesophageal reflux disease) 07/08/2018  . Leukocytosis 07/26/2018    Palliative Care Assessment & Plan   Patient Profile: 62 y.o. male  with past medical history of DM, gerd, HTN who was admitted on 07/09/2018 as a transfer from [facility] in [place], [place].  He was found to have a perforated bowel secondary to a large obstructing mass of the splenic flexture with metastases to the liver and peritoneal wall. He subsequently underwent left hemi-colectomy with colostomy and liver biopsy - pathology revealed invasive adenocarcinoma. He was able to be extubated several days after surgery but required re-intubation on 10/24.  He has developed multiple abscesses and acute renal failure. Recent imaging shows LLL airspace consolidation and 9 mm LUL nodule.  There is no role for chemo or any other cancer treatment at this point as he is simply to ill.  While there is no indication for HD at this point, the patient is not a good dialysis candidate. Extubated 10.27 but developed respiratory distress requiring nebs, lasix, fentanyl infusion, and BiPAP.   Assessment: Metastatic adenocarcinoma Multi-organ failure Acute respiratory failure with hypoxemia Pulmonary nodules Sepsis Abdominal abscesses AKI Protein calorie  malnutrition Anemia  Recommendations/Plan:  DNR. Extubated now on BiPAP per attending.   I discussed with patient's brother (Jerry Green) this afternoon, who has a good understanding of diagnoses and poor prognosis. Discussed transition to full comfort measures in order to allow comfort  and dignity at EOL. The patient's daughter is Jerry Green. VM left for Jerry Green. She works during the day and only visits in the evening. Continue current plan of care. Patient comfortable on fentanyl infusion.   Code Status: DNR   Code Status Orders  (From admission, onward)         Start     Ordered   07/27/18 1038  Do not attempt resuscitation (DNR)  Continuous    Question Answer Comment  In the event of cardiac or respiratory ARREST Do not call a "code blue"   In the event of cardiac or respiratory ARREST Do not perform Intubation, CPR, defibrillation or ACLS   In the event of cardiac or respiratory ARREST Use medication by any route, position, wound care, and other measures to relive pain and suffering. May use oxygen, suction and manual treatment of airway obstruction as needed for comfort.      07/27/18 1038        Code Status History    Date Active Date Inactive Code Status Order ID Comments User Context   07/04/2018 1341 07/27/2018 1038 Full Code [id number]  Rolm Bookbinder, MD Inpatient   07/14/2018 0540 07/25/2018 1341 Full Code [id number]  Norval Morton, MD Inpatient       Prognosis:   Poor prognosis with metastatic adenocarcinoma, abdominal abscesses, acute respiratory failure, acute kidney injury, malnutrition.   Discharge Planning:  To Be Determined  Care plan was discussed with RN, brother, vm left for daughter  Thank you for allowing the Palliative Medicine Team to assist in the care of this patient.   Time In: 1115 Time Out: 1200 Total Time 64mn Prolonged Time Billed no      Greater than 50%  of this time was spent counseling and coordinating care related to the above  assessment and plan.  MIhor Dow FNP-C Palliative Medicine Team  Phone: [phone]: [phone] Please contact Palliative Medicine Team phone at [phone] questions and concerns.

## 2018-07-30 NOTE — Progress Notes (Signed)
PULMONARY / CRITICAL CARE MEDICINE   NAME:  Jerry Green, MRN:  [id number], DOB:  Sep 24, 1956, LOS: 43 ADMISSION DATE:  07/07/2018, CONSULTATION DATE:  08/01/2018 REFERRING MD:  Jeanmarie Hubert, CHIEF COMPLAINT:  Abdominal pain  BRIEF HISTORY:    62 year old diabetic status post exploratory laparotomy 10/9 with finding of perforated sigmoid colon secondary to an obstructing carcinoma.  Pathology positive for invasive metastatic adenocarcinoma.  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  S/p R abdominal colostomy with long segment hartmannas pouch, LLL airspace consolidation and small pleural effusion, pulm nodule in LUL measuring 15m, few scattered peritoneal nodules are identified. CT head 10/19 > no acute process. MRI brain 10/22 > no acute process. CT chest 10/24 >  CT A / P 10/24 >  BILATERAL pulmonary nodules likely metastases. Bibasilar atelectasis and small to moderate LEFT pleural effusion. Persistent intrahepatic abscess collection measuring 4.6 x 4.3 x 4.0 cm immediately anterior to the pigtail of the drainage catheter. Additional fluid collections at the LEFT gutter and potentially in pelvis, could be sterile or infected postoperative collections. Single enlarged LEFT para-aortic lymph node. CULTURES:  BC at OSH  10/8 MRSA PCR >> neg 19 blood cultures  x2>>negative 10/24 Blood Culture>> Pending 10/24 Urine Culture>>. Negative  10/24 Tracheal Aspirate>>   ANTIBIOTICS:  OHS zosyn and cipro Zosyn 10/8 >  Anidulafungin 10/20 >  Daptomycin 10/20 > 10/24 Linezolid 10/25>>   LINES/TUBES:  10/9 ETT >>10/13 10/9 R DL IJ CVC >> ? Removal date 10/9 Wound vac >> ? Removal date RUE PICC 10/14 >  Colostomy 10/9 >  Abdominal abscess drains 10/18 >  07/25/2018 endotracheal tube>>  CONSULTANTS:  10/8>> CCS 10/9 PCCM 10/24 Palliative > no escalation/DNR  SUBJECTIVE:  Awake, follow commands, making urine. Comfortable on fentanyl infusion.   CONSTITUTIONAL: BP 126/80   Pulse 94   Temp 98.4 F (36.9 C)   Resp (!) 26   Ht _0  (1.88 m)   Wt (!) 153.8 kg   SpO2 100%   BMI 43.53 kg/m   I/O last 3 completed shifts: In: 5921.3 [I.V.:4251.2; IV Piggyback:1670.1] Out: 11009 [Urine:9800; Emesis/NG output:650; Drains:44; Stool:515]     Vent Mode: BIPAP FiO2 (%):  [40 %] 40 % PEEP:  [5 cmH20] 5 cmH20 PHYSICAL EXAM:  General:  In bed on vent HENT: BiPAP mask in place with no leak and no pressure ulceration. PULM: Chest clear. CV: Warm extremities, HS normal. GI: no bowel sounds, drains in place, abdominal binder in place. Tender to palpation.  MSK: normal bulk and tone Neuro: awake and following commands.  ASSESSMENT AND PLAN   62year old diabetic status post exploratory laparotomy 10/9 with finding of perforated sigmoid colon secondary to an obstructing carcinoma.  Pathology positive for invasive metastatic adenocarcinoma. Transferred out of ICU 10/15, then PCCM called back PM 10/23 for AMS and tachypnea.  Required transfer to ICU for intubation.  Palliative met with  family on 10/25, explained nature of his evolving multi-organ failure and code status was changed to DNR with no plans to escalate care.    Acute respiratory failure with hypoxemia Pulmonary Nodules >>suspected mets Tolerating BiPAP. No reintubation. Trial off  BiPAP in am.  Sepsis - due to abdominal abscesses +/- PNA Source control with liver and abdominal drains Persistent intrahepatic abscess collection measuring 4.6 x 4.3 x 4.0cm immediately anterior to the pigtail of the drainage catheter. Bowel obstruction, wound binder in place Plan - continue current antimicrobial therapy - continue linezolid, zosyn, anidulofungin - monitor drain output - wound care per surgery  AKI> UOP better, labs about the same Monitor BMET and UOP Replace electrolytes as needed Not a dialysis candidate  Metastatic adenocarcinoma (confirmed after ex lap with biopsy of colon and liver 10/9). Currently not a candidate for cancer treatment given severe infection and multi-organ failure  Code status:  DNR, don't escalate   Acute metabolic encephalopathy - wean off fentanyl  - stop precedex  Protein calorie malnutrition - continue TPN  DM2 - continue SSI  Anemia, not obviously bleeding - monitor bleeding   SUMMARY OF TODAY'S PLAN:  Wean BiPAP tomorrow and transition to alternative pain medication regimen that might be used longer term.  Best Practice / Goals of Care / Disposition.   DVT PROPHYLAXIS: Heparin. SUP: Pepcid. MOBILITY: Bedrest. CODE STATUS: DNR  Ongoing  goals of care discussions to align plan of care with best goals of care for patient.  Marland Kitchen  LABS  Improving Creatinine.   Improving glycemic control.    [name], MD [facility] ICU Physician [place]  Pager: [phone] Mobile: [phone] After hours: [phone].     07/30/2018, 5:52 PM

## 2018-07-30 NOTE — Progress Notes (Signed)
Patient ID: Jerry Green, male   DOB: [birth year], 62 y.o.   MRN: [id number]    19 Days Post-Op  Subjective: Patient on bipap and on 352mcg/hr of fentanyl gtt to keep comfortable and to help ease his WOB.  Denies any pain.  Objective: Vital signs in last 24 hours: Temp:  [99.3 F (37.4 C)-101.7 F (38.7 C)] 99.3 F (37.4 C) (10/28 0830) Pulse Rate:  [70-143] 99 (10/28 0830) Resp:  [17-77] 18 (10/28 0830) BP: (110-183)/(48-148) 147/84 (10/28 0830) SpO2:  [96 %-100 %] 100 % (10/28 0830) FiO2 (%):  [32 %-40 %] 40 % (10/28 0800) Last BM Date: 07/29/18  Intake/Output from previous day: 10/27 0701 - 10/28 0700 In: 3349.7 [I.V.:2067; IV Piggyback:1282.7] Out: 0947 [Urine:6700; Emesis/NG output:400; Drains:29; Stool:515] Intake/Output this shift: Total I/O In: 130.2 [I.V.:130.2] Out: -   PE: Abd: soft, midline wound with more separation today than on Friday when I saw him.  LLQ surgical drain with minimal milky white output.  Other IR drains with minimal output.  RUQ ostomy with some feculent appearing output.  Stoma is pink   Lab Results:  Recent Labs    07/28/18 0402 07/29/18 0347  WBC 14.6* 11.6*  HGB 8.1* 7.7*  HCT 25.4* 24.3*  PLT 391 411*   BMET Recent Labs    07/29/18 0347 07/30/18 0418  NA 145  142 143  K 3.5  3.3* 3.5  CL 113*  111 115*  CO2 20*  19* 18*  GLUCOSE 214*  208* 328*  BUN 101*  98* 100*  CREATININE 4.16*  4.10* 3.92*  CALCIUM 8.6*  8.3* 8.9   PT/INR No results for input(s): LABPROT, INR in the last 72 hours. CMP     Component Value Date/Time   NA 143 07/30/2018 0418   K 3.5 07/30/2018 0418   CL 115 (H) 07/30/2018 0418   CO2 18 (L) 07/30/2018 0418   GLUCOSE 328 (H) 07/30/2018 0418   BUN 100 (H) 07/30/2018 0418   CREATININE 3.92 (H) 07/30/2018 0418   CALCIUM 8.9 07/30/2018 0418   PROT 8.9 (H) 07/25/2018 0500   ALBUMIN 1.7 (L) 07/30/2018 0418   AST 138 (H) 07/25/2018 0500   ALT 105 (H) 07/25/2018 0500   ALKPHOS 119 07/25/2018  0500   BILITOT 1.0 07/25/2018 0500   GFRNONAA 15 (L) 07/30/2018 0418   GFRAA 17 (L) 07/30/2018 0418   Lipase  No results found for: LIPASE     Studies/Results: Dg Chest Port 1 View  Result Date: 07/29/2018 CLINICAL DATA:  Tachypnea. EXAM: PORTABLE CHEST 1 VIEW COMPARISON:  07/28/2018 FINDINGS: Cardiac silhouette is normal in size. No mediastinal or hilar masses. Mild hazy left perihilar and lower lung zone opacity consistent with a combination of pleural fluid and dependent atelectasis. Minor linear atelectasis adjacent to minor fissure on the right. Lung findings stable from the previous day's exam. No new lung abnormalities.  No pneumothorax. Upper abdominal pigtail catheters are unchanged. Right PICC is stable. The endotracheal and nasal/orogastric tubes have been removed. IMPRESSION: 1. Status post removal of the endotracheal tube and nasal/orogastric tube. 2. No other significant change from the previous day's study. Electronically Signed   By: Lajean Manes M.D.   On: 07/29/2018 15:28    Anti-infectives: Anti-infectives (From admission, onward)   Start     Dose/Rate Route Frequency Ordered Stop   07/27/18 1100  linezolid (ZYVOX) IVPB 600 mg     600 mg 300 mL/hr over 60 Minutes Intravenous Every 12 hours 07/27/18 1031  07/23/18 1500  anidulafungin (ERAXIS) 100 mg in sodium chloride 0.9 % 100 mL IVPB     100 mg 78 mL/hr over 100 Minutes Intravenous Every 24 hours 07/22/18 1420     07/22/18 2000  DAPTOmycin (CUBICIN) 850 mg in sodium chloride 0.9 % IVPB  Status:  Discontinued     850 mg 234 mL/hr over 30 Minutes Intravenous Every 48 hours 07/22/18 1459 07/27/18 1031   07/22/18 1530  anidulafungin (ERAXIS) 200 mg in sodium chloride 0.9 % 200 mL IVPB     200 mg 78 mL/hr over 200 Minutes Intravenous  Once 07/22/18 1420 07/22/18 1941   07/22/2018 1800  piperacillin-tazobactam (ZOSYN) IVPB 3.375 g     3.375 g 12.5 mL/hr over 240 Minutes Intravenous Every 8 hours 07/09/2018 1444      07/20/2018 0915  cefoTEtan (CEFOTAN) 2 g in sodium chloride 0.9 % 100 mL IVPB     2 g 200 mL/hr over 30 Minutes Intravenous To Short Stay 07/17/2018 0857 07/20/2018 1900   07/20/2018 0906  cefoTEtan in Dextrose 5% (CEFOTAN) 2-2.08 GM-%(50ML) IVPB    Note to Pharmacy:  [name]   : cabinet override      07/17/2018 0906 08/01/2018 2114   07/03/2018 1200  piperacillin-tazobactam (ZOSYN) IVPB 3.375 g  Status:  Discontinued     3.375 g 12.5 mL/hr over 240 Minutes Intravenous Every 8 hours 07/17/2018 0615 07/22/2018 1341   07/25/2018 0730  cefoTEtan (CEFOTAN) 2 g in sodium chloride 0.9 % 100 mL IVPB  Status:  Discontinued     2 g 200 mL/hr over 30 Minutes Intravenous On call to O.R. 07/12/2018 0723 07/28/2018 0559   07/30/2018 0615  piperacillin-tazobactam (ZOSYN) IVPB 3.375 g     3.375 g 100 mL/hr over 30 Minutes Intravenous  Once 07/03/2018 0612 07/31/2018 1943       Assessment/Plan Acute respiratory failure - on Vent - extubated 10/13, 10/27  Acute renal failure  Hypertension  Diabetes  GERD  Remote tobacco use  BMI 36  Anemia - transfused  Hypokalemia - resolved  Hypernatremia  Hyperchloremia  Malnutrition - moderate - prealbumin 12.2  Hypernatremia -151  Post op ileus, improving  Confusion  Leukocytosis  PNA  Perforated splenic flexure colon cancer with large and small bowel obstruction  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 Rolm Bookbinder POD# 19  Abdominal wound dehiscence--no evisceration. Does not need any reoperative surgery.  -mepitel added over base of wound at superior portion where bowel is present to hopefully prevent fistulization  IR abdominal drain placement x 2 - 07/20/18 - minimal output, CX no growth  -patient now on Bipap and fentanyl gtt to keep comfortable.  He is a DNR with no escalation of care. -may have a diet from our standpoint for what he can tolerate while on Bipap. -still on  TNA, not sure this is indicated given his code status and situation.  FEN: on TNA/IVFs/can have diet if able to have Bipap removed for short periods of time  ID: Zosyn 10/8 -->; Cubacin 10/20 --> eraxis 10/21 -->  DVT: SCD/subcutaneous heparin  Foley: present  Follow up: Dr. Donne Hazel if he survives   LOS: 20 days    [name] , [facility] Surgery 07/30/2018, 8:57 AM Pager: [phone]

## 2018-07-31 DIAGNOSIS — C189 Malignant neoplasm of colon, unspecified: Secondary | ICD-10-CM

## 2018-07-31 DIAGNOSIS — Z933 Colostomy status: Secondary | ICD-10-CM

## 2018-07-31 LAB — RENAL FUNCTION PANEL
ALBUMIN: 1.6 g/dL — AB (ref 3.5–5.0)
Anion gap: 10 (ref 5–15)
BUN: 112 mg/dL — ABNORMAL HIGH (ref 8–23)
CALCIUM: 9.3 mg/dL (ref 8.9–10.3)
CO2: 19 mmol/L — AB (ref 22–32)
CREATININE: 3.41 mg/dL — AB (ref 0.61–1.24)
Chloride: 116 mmol/L — ABNORMAL HIGH (ref 98–111)
GFR, EST AFRICAN AMERICAN: 21 mL/min — AB (ref >60)
GFR, EST NON AFRICAN AMERICAN: 18 mL/min — AB (ref >60)
Glucose, Bld: 252 mg/dL — ABNORMAL HIGH (ref 70–99)
PHOSPHORUS: 3.9 mg/dL (ref 2.5–4.6)
Potassium: 4.3 mmol/L (ref 3.5–5.1)
SODIUM: 145 mmol/L (ref 135–145)

## 2018-07-31 LAB — GLUCOSE, CAPILLARY
GLUCOSE-CAPILLARY: 241 mg/dL — AB (ref 70–99)
GLUCOSE-CAPILLARY: 213 mg/dL — AB (ref 70–99)
GLUCOSE-CAPILLARY: 153 mg/dL — AB (ref 70–99)
GLUCOSE-CAPILLARY: 92 mg/dL (ref 70–99)
Glucose-Capillary: 217 mg/dL — ABNORMAL HIGH (ref 70–99)
Glucose-Capillary: 238 mg/dL — ABNORMAL HIGH (ref 70–99)

## 2018-07-31 LAB — CULTURE, BLOOD (ROUTINE X 2)
Culture: NO GROWTH
Culture: NO GROWTH
SPECIAL REQUESTS: ADEQUATE
Special Requests: ADEQUATE

## 2018-07-31 LAB — MAGNESIUM: Magnesium: 2.4 mg/dL (ref 1.7–2.4)

## 2018-07-31 MED ORDER — HYDROMORPHONE HCL 1 MG/ML IJ SOLN
0.5000 mg | INTRAMUSCULAR | Status: DC | PRN
  Administered 2018-08-01 – 2018-08-06 (×4): 0.5 mg via INTRAVENOUS
  Filled 2018-07-31 (×2): qty 1
  Filled 2018-07-31: qty 0.5
  Filled 2018-07-31: qty 1

## 2018-07-31 MED ORDER — TRAVASOL 10 % IV SOLN
INTRAVENOUS | Status: DC
  Administered 2018-07-31: 18:00:00 via INTRAVENOUS
  Filled 2018-07-31: qty 1699.2

## 2018-07-31 NOTE — Progress Notes (Signed)
Nutrition Follow-up  DOCUMENTATION CODES:   Obesity unspecified  INTERVENTION:   Continue TPN per Pharmacy -Nutrition needs re-estimated post-extubation -Current TPN meets 100% protein needs, 75% calorie needs  Diet advancement as tolerated  Add nutrition supplements as appropriate once diet advanced  NUTRITION DIAGNOSIS:   Inadequate oral intake related to poor appetite, nausea, vomiting as evidenced by per patient/family report.  GOAL:   Patient will meet greater than or equal to 90% of their needs  MONITOR:   Diet advancement, PO intake, Labs, Weight trends, I & O's, Skin  REASON FOR ASSESSMENT:   Consult, Ventilator Enteral/tube feeding initiation and management  ASSESSMENT:   62 year old male who presented with abdominal pain. PMH significant for hypertension, type 2 diabetes mellitus, GERD. 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  10/9 - s/p ex lap, liver biopsy, colectomy with open abdomen and wound VAC 10/11 - start trickle feeds Vital HP @ 20 ml/hr, pt vomited, TF stopped 10/13 - extubated, TPNstarted 10/16 - TPN increased to 65 ml/hr 10/17 - TPNincreased to goal rate of 100 ml/hr, NGT pulled out and replaced 10/18-Perisplenic and hepatic abscess drains placed by IR;NGT pulled out 10/22- diet advanced to clears 10/23 Transfer to ICU, Intubated 10/25 Palliative care discussion, code status changed to DNR, no escalation of care, plan for further conversations regarding [place] in 48 hours 10/27 Extubated  Pt currently sitting in chair, off Bipap, tolerating 2L [place] CL diet ordered but high risk for aspiration; plan for SLP eval per RN NG tube removed  TPN continues at 100 ml/hr; current formulation provide 170 g of protein, 1640 kcals Meets 100% estimated protein needs, 75% calorie needs Noted surgery  recommending considering stopping TPN  If pt not transitioning to comfort care, recommend considering continuing TPN until diet tolerance established and taking some po  Good UOP 3.5 L in past 24 hours, 1 ml/kg/hr  Labs: CBGs 173-315 Meds: BUN 112, Creatinine 3.41, TG 236   Diet Order:   Diet Order            Diet clear liquid Room service appropriate? Yes; Fluid consistency: Thin  Diet effective now              EDUCATION NEEDS:   No education needs have been identified at this time  Skin:  Skin Assessment: Skin Integrity Issues: Skin Integrity Issues:: Other (Comment) Wound Vac: removed Other: abdominal wound dehiscence, colostomy, perisplenic and hepatic drains  Last BM:  10/29 via colostomy  Height:   Ht Readings from Last 1 Encounters:  07/25/18 6\' 2"  (1.88 m)    Weight:   Wt Readings from Last 1 Encounters:  07/29/18 (!) 153.8 kg    Ideal Body Weight:  80.91 kg  BMI:  Body mass index is 43.53 kg/m.  Estimated Nutritional Needs:   Kcal:  2200-2500  Protein:  145-185 g  Fluid:  >/= 2.2 L  Kerman Passey MS, RD, LDN, CNSC [phone] Pager  [phone] Weekend/On-Call Pager

## 2018-07-31 NOTE — Progress Notes (Signed)
Patient ID: Jerry Green, male   DOB: 02-18-1956, 62 y.o.   MRN: [id number]    20 Days Post-Op  Subjective: Patient off bipap.  Still disoriented.  Nods yes to thirsty.  Objective: Vital signs in last 24 hours: Temp:  [96.6 F (35.9 C)-99.5 F (37.5 C)] 98.6 F (37 C) (10/29 0700) Pulse Rate:  [73-111] 93 (10/29 0700) Resp:  [13-30] 17 (10/29 0700) BP: (102-164)/(66-106) 138/82 (10/29 0700) SpO2:  [99 %-100 %] 100 % (10/29 0700) FiO2 (%):  [40 %] 40 % (10/29 0400) Last BM Date: 07/31/18  Intake/Output from previous day: 10/28 0701 - 10/29 0700 In: 3986.4 [I.V.:3106.7; IV Piggyback:879.8] Out: 3575 [Urine:3575] Intake/Output this shift: Total I/O In: 126.2 [I.V.:126.2] Out: -   PE: Abd: stable, all drains with minimal output.  Surgical drain has minimal milky white output, midline incision stable with dehiscence, mepitel in place.  Ostomy just changed.  Stoma is pink and viable.  Lab Results:  Recent Labs    07/29/18 0347  WBC 11.6*  HGB 7.7*  HCT 24.3*  PLT 411*   BMET Recent Labs    07/29/18 0347 07/30/18 0418  NA 145  142 143  K 3.5  3.3* 3.5  CL 113*  111 115*  CO2 20*  19* 18*  GLUCOSE 214*  208* 328*  BUN 101*  98* 100*  CREATININE 4.16*  4.10* 3.92*  CALCIUM 8.6*  8.3* 8.9   PT/INR No results for input(s): LABPROT, INR in the last 72 hours. CMP     Component Value Date/Time   NA 143 07/30/2018 0418   K 3.5 07/30/2018 0418   CL 115 (H) 07/30/2018 0418   CO2 18 (L) 07/30/2018 0418   GLUCOSE 328 (H) 07/30/2018 0418   BUN 100 (H) 07/30/2018 0418   CREATININE 3.92 (H) 07/30/2018 0418   CALCIUM 8.9 07/30/2018 0418   PROT 8.9 (H) 07/25/2018 0500   ALBUMIN 1.7 (L) 07/30/2018 0418   AST 138 (H) 07/25/2018 0500   ALT 105 (H) 07/25/2018 0500   ALKPHOS 119 07/25/2018 0500   BILITOT 1.0 07/25/2018 0500   GFRNONAA 15 (L) 07/30/2018 0418   GFRAA 17 (L) 07/30/2018 0418   Lipase  No results found for: LIPASE     Studies/Results: Dg  Chest Port 1 View  Result Date: 07/29/2018 CLINICAL DATA:  Tachypnea. EXAM: PORTABLE CHEST 1 VIEW COMPARISON:  07/28/2018 FINDINGS: Cardiac silhouette is normal in size. No mediastinal or hilar masses. Mild hazy left perihilar and lower lung zone opacity consistent with a combination of pleural fluid and dependent atelectasis. Minor linear atelectasis adjacent to minor fissure on the right. Lung findings stable from the previous day's exam. No new lung abnormalities.  No pneumothorax. Upper abdominal pigtail catheters are unchanged. Right PICC is stable. The endotracheal and nasal/orogastric tubes have been removed. IMPRESSION: 1. Status post removal of the endotracheal tube and nasal/orogastric tube. 2. No other significant change from the previous day's study. Electronically Signed   By: Lajean Manes M.D.   On: 07/29/2018 15:28    Anti-infectives: Anti-infectives (From admission, onward)   Start     Dose/Rate Route Frequency Ordered Stop   07/27/18 1100  linezolid (ZYVOX) IVPB 600 mg     600 mg 300 mL/hr over 60 Minutes Intravenous Every 12 hours 07/27/18 1031     07/23/18 1500  anidulafungin (ERAXIS) 100 mg in sodium chloride 0.9 % 100 mL IVPB     100 mg 78 mL/hr over 100 Minutes Intravenous Every 24 hours  07/22/18 1420     07/22/18 2000  DAPTOmycin (CUBICIN) 850 mg in sodium chloride 0.9 % IVPB  Status:  Discontinued     850 mg 234 mL/hr over 30 Minutes Intravenous Every 48 hours 07/22/18 1459 07/27/18 1031   07/22/18 1530  anidulafungin (ERAXIS) 200 mg in sodium chloride 0.9 % 200 mL IVPB     200 mg 78 mL/hr over 200 Minutes Intravenous  Once 07/22/18 1420 07/22/18 1941   07/27/2018 1800  piperacillin-tazobactam (ZOSYN) IVPB 3.375 g     3.375 g 12.5 mL/hr over 240 Minutes Intravenous Every 8 hours 07/15/2018 1444     07/14/2018 0915  cefoTEtan (CEFOTAN) 2 g in sodium chloride 0.9 % 100 mL IVPB     2 g 200 mL/hr over 30 Minutes Intravenous To Short Stay 07/14/2018 0857 07/06/2018 1900    07/20/2018 0906  cefoTEtan in Dextrose 5% (CEFOTAN) 2-2.08 GM-%(50ML) IVPB    Note to Pharmacy:  [name]   : cabinet override      07/12/2018 0906 07/19/2018 2114   07/21/2018 1200  piperacillin-tazobactam (ZOSYN) IVPB 3.375 g  Status:  Discontinued     3.375 g 12.5 mL/hr over 240 Minutes Intravenous Every 8 hours 07/15/2018 0615 07/08/2018 1341   07/21/2018 0730  cefoTEtan (CEFOTAN) 2 g in sodium chloride 0.9 % 100 mL IVPB  Status:  Discontinued     2 g 200 mL/hr over 30 Minutes Intravenous On call to O.R. 07/29/2018 0723 07/29/2018 0559   07/09/2018 0615  piperacillin-tazobactam (ZOSYN) IVPB 3.375 g     3.375 g 100 mL/hr over 30 Minutes Intravenous  Once 07/04/2018 0612 07/16/2018 1943       Assessment/Plan Acute respiratory failure - on Vent - extubated 10/13, 10/27  Acute renal failure  Hypertension  Diabetes  GERD  Remote tobacco use  BMI 36  Anemia - transfused  Hypokalemia - resolved  Hypernatremia  Hyperchloremia  Malnutrition - moderate - prealbumin 12.2  Hypernatremia -151  Post op ileus, improving  Confusion  Leukocytosis  PNA  Perforated splenic flexure colon cancer with large and small bowel obstruction  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 Rolm Bookbinder POD# 20  Abdominal wound dehiscence--no evisceration. Does not need any reoperative surgery.  -mepitel added over base of wound where bowel is present to hopefully prevent fistulization  IR abdominal drain placement x 2 - 07/20/18 - minimal output, CX no growth  -patient on comfort measures and pursuing hospice care.  -consider DC TNA, given comfort measures -consider comfort feeds/sips of liquids at least to keep him comfortable.    FEN: on TNA, but highly recommend discontinuation given he is comfort care/IVFs/can have comfort feeds ID: Zosyn 10/8 -->; Cubacin 10/20 --> eraxis 10/21 -->  DVT: SCD/subcutaneous heparin  Foley:  present  Follow up: Dr. Donne Hazel if he survives   LOS: 21 days    [name] , [facility] Surgery 07/31/2018, 8:22 AM Pager: [phone]

## 2018-07-31 NOTE — Progress Notes (Signed)
Daily Progress Note   Patient Name: Jerry Green       Date: 07/31/2018 DOB: [birth year]  Age: 63 y.o. MRN#: [id number] Attending Physician: Juanito Doom, MD Primary Care Physician: Ollen Bowl, MD Admit Date: 07/09/2018  Reason for Consultation/Follow-up: Establishing goals of care  Subjective: Visited with patient along with RN at bedside. Patient is oriented to name but otherwise disoriented. He denies pain. Attempted to explore his understanding of current condition and what brought him to the hospital. He tells me "drugs" brought him to the hospital. Patient irritable and mumbling. Asking for juice. Now on clear liquids. Appears comfortable and sitting in recliner.   **Chart reviewed and spoke with RN. Patient off BiPAP now on 1L [place]. Stable respiratory status. Fentanyl gtt discontinued and denies pain. He has been disoriented and hallucinating today.   Length of Stay: 21  Current Medications: Scheduled Meds:  . chlorhexidine gluconate (MEDLINE KIT)  15 mL Mouth Rinse BID  . heparin injection (subcutaneous)  5,000 Units Subcutaneous Q8H  . insulin aspart  0-20 Units Subcutaneous Q4H  . insulin glargine  25 Units Subcutaneous Daily  . ipratropium-albuterol  3 mL Nebulization TID  . sodium bicarbonate  100 mEq Intravenous Once    Continuous Infusions: . sodium chloride Stopped (07/29/18 1541)  . sodium chloride Stopped (07/31/18 1141)  . anidulafungin 78 mL/hr at 07/31/18 1500  . linezolid (ZYVOX) IV Stopped (07/31/18 1119)  . piperacillin-tazobactam (ZOSYN)  IV Stopped (07/31/18 1419)  . TPN ADULT (ION) 100 mL/hr at 07/31/18 1500  . TPN ADULT (ION)      PRN Meds: sodium chloride, sodium chloride, acetaminophen (TYLENOL) oral liquid 160 mg/5 mL, hydrALAZINE,  HYDROmorphone (DILAUDID) injection, sodium chloride flush  Physical Exam  Constitutional: He appears ill.  HENT:  Head: Normocephalic and atraumatic.  Cardiovascular: Regular rhythm.  Pulmonary/Chest: No accessory muscle usage. No tachypnea. No respiratory distress.  1L [place]  Abdominal: There is no tenderness.  Abdominal binder  Neurological: He is alert. He is disoriented.  Skin: Skin is warm and dry.  Psychiatric: Cognition and memory are impaired.  Irritable  Nursing note and vitals reviewed.           Vital Signs: BP 126/72   Pulse 96   Temp 98.8 F (37.1 C)   Resp 19   Ht 6'  2" (1.88 m)   Wt (!) 153.8 kg   SpO2 100%   BMI 43.53 kg/m  SpO2: SpO2: 100 % O2 Device: O2 Device: Nasal Cannula O2 Flow Rate: O2 Flow Rate (L/min): 1 L/min  Intake/output summary:   Intake/Output Summary (Last 24 hours) at 07/31/2018 1542 Last data filed at 07/31/2018 1500 Gross per 24 hour  Intake 4130.98 ml  Output 4020 ml  Net 110.98 ml   LBM: Last BM Date: 07/31/18 Baseline Weight: Weight: 120.4 kg Most recent weight: Weight: (!) 153.8 kg       Palliative Assessment/Data: PPS 20%     Patient Active Problem List   Diagnosis Date Noted  . Liver metastases ([place])   . Palliative care by specialist   . Metastatic adenocarcinoma ([place])   . Metastatic cancer ([place])   . DNR (do not resuscitate)   . Terminal care   . Abdominal fluid collection   . Fever   . Acute respiratory failure with hypoxia ([place])   . Perforated colon cancer of splenic flexure s/p colectomy/colostomy 07/21/2018 07/19/2018  . 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 Ileus, postoperative ([place]) 07/19/2018  . 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 Cancer of splenic flexure of colon pT4b, pN1b M1   . Bowel obstruction ([place]) 07/21/2018  . Peritoneal carcinomatosis ([place]) 07/12/2018  . Essential hypertension 07/27/2018  . GERD (gastroesophageal reflux disease) 07/26/2018  . Leukocytosis 07/20/2018    Palliative  Care Assessment & Plan   Patient Profile: 62 y.o. male  with past medical history of DM, gerd, HTN who was admitted on 08/01/2018 as a transfer from [facility] in [place], [place].  He was found to have a perforated bowel secondary to a large obstructing mass of the splenic flexture with metastases to the liver and peritoneal wall. He subsequently underwent left hemi-colectomy with colostomy and liver biopsy - pathology revealed invasive adenocarcinoma. He was able to be extubated several days after surgery but required re-intubation on 10/24.  He has developed multiple abscesses and acute renal failure. Recent imaging shows LLL airspace consolidation and 9 mm LUL nodule.  There is no role for chemo or any other cancer treatment at this point as he is simply to ill.  While there is no indication for HD at this point, the patient is not a good dialysis candidate. Extubated 10.27 but developed respiratory distress requiring nebs, lasix, fentanyl infusion, and BiPAP.   Assessment: Metastatic adenocarcinoma Multi-organ failure Acute respiratory failure with hypoxemia Pulmonary nodules Sepsis Abdominal abscesses AKI Protein calorie malnutrition Anemia  Recommendations/Plan:  DNR. Extubated, off BiPAP and maintaining respiratory status on 1L [place]. Fentanyl gtt discontinued. Denies pain.   Patient is alert but disoriented. Unable to make medical decisions. No family at bedside. VM left for daughter, [name]. Will need further discussions regarding [place] now that patient is showing slight improvement and doing well off BiPAP. May be able to progress with therapy. Overall poor long-term prognosis with metastatic colon cancer.   Code Status: DNR   Code Status Orders  (From admission, onward)         Start     Ordered   07/27/18 1038  Do not attempt resuscitation (DNR)  Continuous    Question Answer Comment  In the event of cardiac or respiratory ARREST Do not call a "code blue"   In the event of  cardiac or respiratory ARREST Do not perform Intubation, CPR, defibrillation or ACLS   In the event of cardiac or respiratory ARREST Use medication  by any route, position, wound care, and other measures to relive pain and suffering. May use oxygen, suction and manual treatment of airway obstruction as needed for comfort.      07/27/18 1038        Code Status History    Date Active Date Inactive Code Status Order ID Comments User Context   07/06/2018 1341 07/27/2018 1038 Full Code [id number]  Rolm Bookbinder, MD Inpatient   07/12/2018 0540 08/01/2018 1341 Full Code [id number]  Norval Morton, MD Inpatient       Prognosis:   Poor prognosis with metastatic adenocarcinoma, abdominal abscesses, acute respiratory failure, acute kidney injury, malnutrition.   Discharge Planning:  To Be Determined  Care plan was discussed with RN, patient, vm left for daughter  Thank you for allowing the Palliative Medicine Team to assist in the care of this patient.   Time In: 1520 Time Out: 1540 Total Time 80mn Prolonged Time Billed no      Greater than 50%  of this time was spent counseling and coordinating care related to the above assessment and plan.  MIhor Dow FNP-C Palliative Medicine Team  Phone: [phone]: [phone] Please contact Palliative Medicine Team phone at [phone] questions and concerns.

## 2018-07-31 NOTE — Consult Note (Addendum)
[place] Nurse ostomy follow up Surgical team following for assessment and plan of care to abd wound.  Stomal assessment/size: 1 1/2 inches, red and viable, flush with skin level. Peristomal assessment: Intact skin surrounding Treatment options for stomal/peristomal skin: barrier ring Output: small amt liquid brown stool Ostomy pouching: 2pc.2 3/4"  Education provided: Pt is confused and there is no family present Enrolled patient in [place] Start Discharge program: No Applied barrier ring to attempt to maintain a seal and 2 piece pouching system.  Supplies at the bedside for staff nurse use. WOC will continue to follow for weekly assessment.  Julien Girt MSN, RN, [place], [place], [place]

## 2018-07-31 NOTE — Progress Notes (Signed)
PULMONARY / CRITICAL CARE MEDICINE   NAME:  Jerry Green, MRN:  [id number], DOB:  22-Jun-1956, LOS: 21 ADMISSION DATE:  07/14/2018, CONSULTATION DATE:  08/01/2018 REFERRING MD:  Jeanmarie Hubert, CHIEF COMPLAINT:  Abdominal pain  BRIEF HISTORY:    62 year old diabetic status post exploratory laparotomy 10/9 with finding of perforated sigmoid colon secondary to an obstructing carcinoma.  Pathology positive for invasive metastatic adenocarcinoma.  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 CT head 10/19 > no acute process. MRI brain 10/22 > no acute process. CT chest 10/24 >  CT A / P 10/24 >  BILATERAL pulmonary nodules likely metastases. Bibasilar atelectasis and small to moderate LEFT pleural effusion. Persistent intrahepatic abscess collection measuring 4.6 x 4.3 x 4.0 cm immediately anterior to the pigtail of the drainage catheter. Additional fluid collections at the LEFT gutter and potentially in pelvis, could be sterile or infected postoperative collections. Single enlarged LEFT para-aortic lymph node. CULTURES:  BC at OSH  10/8 MRSA PCR >> neg 19 blood cultures x2>>negative 10/24 Blood  Culture>> Pending 10/24 Urine Culture>>. Negative  10/24 Tracheal Aspirate>>   ANTIBIOTICS:  OHS zosyn and cipro Zosyn 10/8 >  Anidulafungin 10/20 >  Daptomycin 10/20 > 10/24 Linezolid 10/25>>  LINES/TUBES:  10/9 ETT >>10/13 10/9 R DL IJ CVC >> ? Removal date 10/9 Wound vac >> ? Removal date RUE PICC 10/14 >  Colostomy 10/9 >  Abdominal abscess drains 10/18 >  07/25/2018 endotracheal tube>>  CONSULTANTS:  10/8>> CCS 10/9 PCCM 10/24 Palliative > no escalation/DNR  SUBJECTIVE:   BiPAP stopped this morning. Patient is increasingly awake. Denies pain off fentanyl infusion.   PHYSICAL EXAM:  Vital signs:  Normotensive, not tachycardic. HENT: soft voice. PULM: Chest clear. On 2Lpm with normal respiratory effort. CV: Warm extremities, HS normal. GI: no bowel sounds, drains in place, abdominal binder in place. Not tender to palpation.  MSK: normal bulk and tone Neuro: awake and following commands. Poor insight.  ASSESSMENT AND PLAN    Assessment  Respiratory failure requiring mechanical ventilation for ARDS following peritonitis from perforated sigmoid colon.  Now resolving. New diagnosis of stage IV adenocarcinoma of colon. Multi-system organ failure with encephalopathy and acute kidney injury Sepsis without shock due to intra abdominal abscesses. Moderate malnutrition. Type 2 diabetes with hyperglycemia due to TPN and steroids   Plan BiPAP off. Pulmonary toilette, but unlikely to comply with  I/S.  Minimize narcotics to clear delirium and promote recovery of bowel function Progressive ambulation. Continue current antibiotics: linezolid, Zosyn, Eraxis Continue TPN, add acetate to correct acidosis. Stopped decadron Continue current Lantus and SSI, and increase Lantus if hyperglycemia remains uncontrolled with discontinuation of steroids.   Best Practice / Goals of Care / Disposition.   Nutrition: on TPN.  Not able to tolerate enteral feeds DVT PROPHYLAXIS:  Heparin Ray SUP: no longer required MOBILITY: Bedrest. CODE STATUS: DNR  Ongoing  goals of care discussions to align plan of care with best goals of care for patient.  Currently DNR, however condition may still improve to allow discharge home. Disposition: ready for transfer to [facility]. May go to stepdown today.  Transfer orders complete and [place] Team 1 informed.  LABS   Creatinine continues to improve Continued hyperglycemia Mild hyperchloremic acidosis.  Total time spent 35min including >50% on counseling and coordination of care.  [name], MD [facility] ICU Physician [place]  Pager: [phone] Mobile: [phone] After hours: [phone].  07/31/2018, 9:50 AM

## 2018-07-31 NOTE — Progress Notes (Signed)
Pt taken off bipap and placed on 4L [place] per MD order. No distress noted, no increased WOB, pt denies SOB. RN aware. RT will continue to monitor

## 2018-07-31 NOTE — Progress Notes (Signed)
240 ml Fentanyl IV wasted in sink with Harriett Sine RN.

## 2018-07-31 NOTE — Progress Notes (Signed)
[place] NOTE   Pharmacy Consult for TPN Indication: prolonged ileus  Patient Measurements: Height: 6\' 2"  (188 cm) Weight: (!) 339 lb (153.8 kg) IBW/kg (Calculated) : 82.2 TPN AdjBW (KG): 86.1 Body mass index is 43.53 kg/m.   Assessment:  71 yom transferred from OSH with abdominal pain, N/V and found to have an acute perforated/obstructing large carcinoma in the colon with multiple metastases. S/p OR 10/9 for exlap and liver biopsy (positive) with colectomy. Attempted TF but with almost immediate vomiting. Started TPN for nutrition support with post-op ileus. Pt is at risk for refeeding.  Poor prognosis for survival, family conference with Palliative Care ongoing.  GI: Pre-albumin down to 12.3. S/p colectomy with ongoing ileus - -69mL drain OP, 67mL NG OP. Stool OP 60ml - 10/18: Perisplenic abscess drain, Hepatic abscess drain. Famotidine in TPN - New diagnosis of metastatic colon cancer  Endo: Hx DM (No insulin PTA) CBGs improved but remain  (173-241s). Had a low on 10/25 which required TPN to be stopped due to a large amount of insulin in the bag. Has 60 units in the bag, Lantus 25 units, and on resistant SSI. Of note, decadron has been stopped today  Insulin requirements over past 24 hrs: 47 units of SSI Lytes: wnl today except Cl 116, HCO3 18>19, CoCa 10.7, Phos down to 3.9.  Minimal lytes in TPN Renal: AKI - SCr trending back down to 3.92 (baseline~1), BUN up to 100, UOP ok. Off Bicarb drip and free water Pulm: extubated 10/13 to RA, intubated 10/24 Cards: h/o HTN:  VSS Hepatobil: LFTs moderately elevated (AST 138 / ALT 105), tbili down to WNL, TG elevated but down to 236 Neuro: Transient confusion. Fent/Midazolam prn ID: Hypotermic; Zosyn postop for gross contamination with stool perforated colon, now s/p dapto and on linezolid/ Eraxis, BCx/liver abscess cx - negative   TPN Access: CVC TPN start date: 07/16/18 Nutritional Goals (per RD rec  on 10/24): KCal: 6720 - 1805 KCal Protein: 160-200 g  Fluid: >/= 2L/day  Goal TPN rate is 100 ml/hr (provides 170g protein and 1700 KCal - 100% goal)  Current Nutrition:  TPN  Plan:  Continue TPN at 100 mL/hr TPN provides 170 g of protein, 141 g of dextrose, and 48 g of lipids which provides 1,640 kCals per day, meeting 100% of goal kcal and goal AA  Electrolytes in TPN: Decrease K, continue low concentrations of Phos, and Mg. Cl:Ac Max acetate Add daily MVI, trace elements, Pepcid 20 mg and decrease to 20 units of regular insulin to TPN  Continue resistant SSI and adjust as needed Continue Lantus 25 units daily Monitor TPN labs, triglyceride trend, renal function panel and Mg today  No further escalation of care and leaning more towards comfort but still treating infection. Follow up indication for TPN at this point  Albertina Parr, PharmD., BCPS Clinical Pharmacist Clinical phone for 07/31/18 until 3:30pm: [phone] If after 3:30pm, please refer to [facility] for unit-specific pharmacist

## 2018-07-31 NOTE — Plan of Care (Signed)
  Problem: Clinical Measurements: Goal: Respiratory complications will improve Outcome: Progressing   Problem: Nutrition: Goal: Adequate nutrition will be maintained Outcome: Progressing   Problem: Elimination: Goal: Will not experience complications related to urinary retention Outcome: Progressing   Problem: Pain Managment: Goal: General experience of comfort will improve Outcome: Progressing   Problem: Spiritual Needs Goal: Ability to function at adequate level Outcome: Not Progressing   Problem: Education: Goal: Knowledge of General Education information will improve Description Including pain rating scale, medication(s)/side effects and non-pharmacologic comfort measures  Outcome: Not Progressing   Problem: Health Behavior/Discharge Planning: Goal: Ability to manage health-related needs will improve Outcome: Not Progressing

## 2018-07-31 NOTE — Care Management Note (Addendum)
Case Management Note Initial CM note completed by Midge Minium RN, BSN, NCM-BC, ACM-RN [phone] 07/17/2018, 3:44 PM   Patient Details  Name: Jerry Green MRN: [id number] Date of Birth: [birth year]  Subjective/Objective:  62 y.o. male transferred from OSH with worsening RLQ abdominal pain. S/p exp. Lap and liver biopsy s/p colectomy and open abd with wound vac intact. PMH: HTN, DM type 2, and Gerd               Action/Plan: CM met with patient to discuss transitional needs and therapy recommendations for CIR. Patient stated living a home alone, independent with ADLs with no DME in use PTA. Patient indicated his daughter, Jerry Green, lives nearby and helps with his finances. PCP confirmed as: Dr. Brooke Bonito. CM discussed CIR, with patient agreeable and very motivated to work towards his independence; patient verbalized to CM he's very eager to eat again. CM will continue to follow.    *See below for further CM notes:  Expected Discharge Date:                  Expected Discharge Plan:  [place]  In-House Referral:  Clinical Social Work  Discharge planning Services  CM Consult  Post Acute Care Choice:  IP Rehab Choice offered to:  Patient  DME Arranged:  N/A DME Agency:  NA  HH Arranged:  NA HH Agency:  NA  Status of Service:  In process, will continue to follow  If discussed at Long Length of Stay Meetings, dates discussed:    Additional Comments:  07/31/18- 1430- Kaislee Chao RN, CM-  Hx Perforated splenic flexure colon cancer with large and small bowel obstruction s/p exp lap on this admit- Metastatic Colon adenocarcinoma  Pt tx back to ICU on 10/23 for intubation dx on 10/24 with stage 4 colon cancer with mets to the liver. 10/27- extubated to BIPAP- now on 4L -DNR (no escalation of care) PC following for [place].   Dahlia Client [place], RN 07/31/2018, 2:41 PM [phone] [id number] Transitions of Care RN Case Manager

## 2018-08-01 DIAGNOSIS — K56699 Other intestinal obstruction unspecified as to partial versus complete obstruction: Secondary | ICD-10-CM

## 2018-08-01 LAB — GLUCOSE, CAPILLARY
GLUCOSE-CAPILLARY: 192 mg/dL — AB (ref 70–99)
GLUCOSE-CAPILLARY: 177 mg/dL — AB (ref 70–99)
GLUCOSE-CAPILLARY: 125 mg/dL — AB (ref 70–99)
Glucose-Capillary: 167 mg/dL — ABNORMAL HIGH (ref 70–99)
Glucose-Capillary: 172 mg/dL — ABNORMAL HIGH (ref 70–99)
Glucose-Capillary: 275 mg/dL — ABNORMAL HIGH (ref 70–99)

## 2018-08-01 LAB — RENAL FUNCTION PANEL
ANION GAP: 17 — AB (ref 5–15)
Albumin: 1.7 g/dL — ABNORMAL LOW (ref 3.5–5.0)
BUN: 119 mg/dL — ABNORMAL HIGH (ref 8–23)
CHLORIDE: 115 mmol/L — AB (ref 98–111)
CO2: 15 mmol/L — ABNORMAL LOW (ref 22–32)
Calcium: 8.8 mg/dL — ABNORMAL LOW (ref 8.9–10.3)
Creatinine, Ser: 3.43 mg/dL — ABNORMAL HIGH (ref 0.61–1.24)
GFR, EST AFRICAN AMERICAN: 20 mL/min — AB (ref >60)
GFR, EST NON AFRICAN AMERICAN: 18 mL/min — AB (ref >60)
Glucose, Bld: 242 mg/dL — ABNORMAL HIGH (ref 70–99)
POTASSIUM: 3.2 mmol/L — AB (ref 3.5–5.1)
Phosphorus: 3 mg/dL (ref 2.5–4.6)
Sodium: 147 mmol/L — ABNORMAL HIGH (ref 135–145)

## 2018-08-01 LAB — CBC WITH DIFFERENTIAL/PLATELET
Abs Immature Granulocytes: 0.18 10*3/uL — ABNORMAL HIGH (ref 0.00–0.07)
BASOS ABS: 0 10*3/uL (ref 0.0–0.1)
Basophils Relative: 0 %
EOS PCT: 0 %
Eosinophils Absolute: 0 10*3/uL (ref 0.0–0.5)
HEMATOCRIT: 26.1 % — AB (ref 39.0–52.0)
Hemoglobin: 8 g/dL — ABNORMAL LOW (ref 13.0–17.0)
Immature Granulocytes: 1 %
Lymphocytes Relative: 13 %
Lymphs Abs: 1.7 10*3/uL (ref 0.7–4.0)
MCH: 28 pg (ref 26.0–34.0)
MCHC: 30.7 g/dL (ref 30.0–36.0)
MCV: 91.3 fL (ref 80.0–100.0)
Monocytes Absolute: 1 10*3/uL (ref 0.1–1.0)
Monocytes Relative: 8 %
NEUTROS ABS: 10.6 10*3/uL — AB (ref 1.7–7.7)
NEUTROS PCT: 78 %
NRBC: 0.4 % — AB (ref 0.0–0.2)
Platelets: 520 10*3/uL — ABNORMAL HIGH (ref 150–400)
RBC: 2.86 MIL/uL — AB (ref 4.22–5.81)
RDW: 14.9 % (ref 11.5–15.5)
WBC: 13.6 10*3/uL — AB (ref 4.0–10.5)

## 2018-08-01 MED ORDER — TRAVASOL 10 % IV SOLN
INTRAVENOUS | Status: AC
  Filled 2018-08-01: qty 679.68

## 2018-08-01 MED ORDER — IPRATROPIUM-ALBUTEROL 0.5-2.5 (3) MG/3ML IN SOLN: 3.0000 mL | Freq: Four times a day (QID) | RESPIRATORY_TRACT | Status: DC | PRN

## 2018-08-01 NOTE — Progress Notes (Signed)
Daily Progress Note   Patient Name: Jerry Green       Date: 08/01/2018 DOB: 1956-05-16  Age: 62 y.o. MRN#: [id number] Attending Physician: Bonnell Public, MD Primary Care Physician: Ollen Bowl, MD Admit Date: 07/04/2018  Reason for Consultation/Follow-up: Establishing goals of care  Subjective: Patient wakes to voice but remains confused. He can only tell me his name. He is disoriented to place and situation. He denies pain. No s/s of acute distress or discomfort.   No family at bedside. I have left another vm for daughter, [name] to further discuss GOC/hospice options.   Length of Stay: 22  Current Medications: Scheduled Meds:  . chlorhexidine gluconate (MEDLINE KIT)  15 mL Mouth Rinse BID  . heparin injection (subcutaneous)  5,000 Units Subcutaneous Q8H  . insulin aspart  0-20 Units Subcutaneous Q4H  . insulin glargine  25 Units Subcutaneous Daily  . sodium bicarbonate  100 mEq Intravenous Once    Continuous Infusions: . sodium chloride Stopped (07/29/18 1541)  . sodium chloride Stopped (07/31/18 1141)  . anidulafungin Stopped (07/31/18 1621)  . linezolid (ZYVOX) IV 600 mg (08/01/18 0807)  . piperacillin-tazobactam (ZOSYN)  IV 3.375 g (08/01/18 0803)  . TPN ADULT (ION)      PRN Meds: sodium chloride, sodium chloride, acetaminophen (TYLENOL) oral liquid 160 mg/5 mL, hydrALAZINE, HYDROmorphone (DILAUDID) injection, ipratropium-albuterol, sodium chloride flush  Physical Exam  Constitutional: He appears ill.  HENT:  Head: Normocephalic and atraumatic.  Cardiovascular: Regular rhythm.  Pulmonary/Chest: No accessory muscle usage. No tachypnea. No respiratory distress.  1L [place]  Abdominal: There is no tenderness.  Abdominal binder  Neurological: He is alert. He is  disoriented.  Skin: Skin is warm and dry.  Psychiatric: Cognition and memory are impaired.  Irritable  Nursing note and vitals reviewed.           Vital Signs: BP 135/88   Pulse 88   Temp 98.6 F (37 C)   Resp (!) 34   Ht 6' 2"  (1.88 m)   Wt (!) 153.8 kg   SpO2 100%   BMI 43.53 kg/m  SpO2: SpO2: 100 % O2 Device: O2 Device: Room Air O2 Flow Rate: O2 Flow Rate (L/min): 1 L/min  Intake/output summary:   Intake/Output Summary (Last 24 hours) at 08/01/2018 1223 Last data filed at 08/01/2018 0600  Gross per 24 hour  Intake 2638 ml  Output 3245 ml  Net -607 ml   LBM: Last BM Date: 07/31/18 Baseline Weight: Weight: 120.4 kg Most recent weight: Weight: (!) 153.8 kg       Palliative Assessment/Data: PPS 20%     Patient Active Problem List   Diagnosis Date Noted  . Malignant neoplasm of colon ([place])   . Liver metastases ([place])   . Palliative care by specialist   . Metastatic adenocarcinoma ([place])   . Metastatic cancer ([place])   . DNR (do not resuscitate)   . Terminal care   . Intraabdominal fluid collection   . Fever   . Acute respiratory failure with hypoxia ([place])   . Perforated colon cancer of splenic flexure s/p colectomy/colostomy 07/15/2018 07/19/2018  . 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 Ileus, postoperative ([place]) 07/19/2018  . 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 Cancer of splenic flexure of colon pT4b, pN1b M1   . Bowel obstruction ([place]) 07/25/2018  . Peritoneal carcinomatosis ([place]) 08/01/2018  . Essential hypertension 07/30/2018  . GERD (gastroesophageal reflux disease) 07/19/2018  . Leukocytosis 07/05/2018    Palliative Care Assessment & Plan   Patient Profile: 62 y.o. male  with past medical history of DM, gerd, HTN who was admitted on 07/26/2018 as a transfer from [facility] in [place], [place].  He was found to have a perforated bowel secondary to a large obstructing mass of the splenic flexture with metastases to the liver and peritoneal wall.  He subsequently underwent left hemi-colectomy with colostomy and liver biopsy - pathology revealed invasive adenocarcinoma. He was able to be extubated several days after surgery but required re-intubation on 10/24.  He has developed multiple abscesses and acute renal failure. Recent imaging shows LLL airspace consolidation and 9 mm LUL nodule.  There is no role for chemo or any other cancer treatment at this point as he is simply to ill.  While there is no indication for HD at this point, the patient is not a good dialysis candidate. Extubated 10.27 but developed respiratory distress requiring nebs, lasix, fentanyl infusion, and BiPAP.   Assessment: Metastatic adenocarcinoma Multi-organ failure Acute respiratory failure with hypoxemia Pulmonary nodules Sepsis Abdominal abscesses AKI Protein calorie malnutrition Anemia  Recommendations/Plan:  DNR. Extubated, off BiPAP and maintaining respiratory status on 1L [place]. Fentanyl gtt discontinued. Denies pain.   Patient is alert but disoriented. Unable to make medical decisions. No family at bedside. VM left again for daughter, [name]. She is not returning my calls. Will need further discussions regarding [place] now that patient is showing slight improvement and doing well off BiPAP. Poor nutritional status. Likely eligible for inpatient hospice facility with poor prognosis.     Code Status: DNR   Code Status Orders  (From admission, onward)         Start     Ordered   07/27/18 1038  Do not attempt resuscitation (DNR)  Continuous    Question Answer Comment  In the event of cardiac or respiratory ARREST Do not call a "code blue"   In the event of cardiac or respiratory ARREST Do not perform Intubation, CPR, defibrillation or ACLS   In the event of cardiac or respiratory ARREST Use medication by any route, position, wound care, and other measures to relive pain and suffering. May use oxygen, suction and manual treatment of airway obstruction as  needed for comfort.      07/27/18 1038        Code  Status History    Date Active Date Inactive Code Status Order ID Comments User Context   07/12/2018 1341 07/27/2018 1038 Full Code [id number]  Rolm Bookbinder, MD Inpatient   07/22/2018 0540 07/03/2018 1341 Full Code [id number]  Norval Morton, MD Inpatient       Prognosis:   Poor prognosis with metastatic adenocarcinoma, abdominal abscesses, acute respiratory failure, acute kidney injury, malnutrition.   Discharge Planning:  To Be Determined  Care plan was discussed with RN, patient, vm left for daughter  Thank you for allowing the Palliative Medicine Team to assist in the care of this patient.   Time In: 1200 Time Out: 1230 Total Time 69mn Prolonged Time Billed no      Greater than 50%  of this time was spent counseling and coordinating care related to the above assessment and plan.  MIhor Dow FNP-C Palliative Medicine Team  Phone: [phone]: [phone] Please contact Palliative Medicine Team phone at [phone] questions and concerns.

## 2018-08-01 NOTE — Progress Notes (Signed)
Referring Physician(s): Dr Marthenia Rolling  Supervising Physician: Marybelle Killings  Patient Status:  [facility] - In-pt  Chief Complaint:  Perisplenic and perihepatic abscess drains placed in IR 10/18  Subjective:  OP minimal for perihepatic - cloudy yellow Flushes easily OP almost none for perisplenic drain Flushes with resistance-- bloody OP in JP  Allergies: Aspirin  Medications: Prior to Admission medications   Medication Sig Start Date End Date Taking? Authorizing Provider  allopurinol (ZYLOPRIM) 100 MG tablet Take 100 mg by mouth daily. 06/14/18  Yes [provider]  enalapril (VASOTEC) 5 MG tablet Take 5 mg by mouth daily. 06/14/18  Yes [provider]  glimepiride (AMARYL) 2 MG tablet Take 2 mg by mouth 2 (two) times daily. 07/04/18  Yes [provider]  metFORMIN (GLUCOPHAGE) 1000 MG tablet Take 1,000 mg by mouth 2 (two) times daily. 06/14/18  Yes [provider]  pantoprazole (PROTONIX) 40 MG tablet Take 40 mg by mouth daily. 06/14/18  Yes [provider]  simvastatin (ZOCOR) 40 MG tablet Take 40 mg by mouth daily. 06/14/18  Yes [provider]     Vital Signs: BP 133/89   Pulse 90   Temp 98.6 F (37 C)   Resp (!) 25   Ht 6\' 2"  (1.88 m)   Wt (!) 339 lb (153.8 kg)   SpO2 100%   BMI 43.53 kg/m   Physical Exam  Skin: Skin is warm and dry.  Both drains intact Peri hep easily flushes -- OP cloudy yellow Minimal OP --   Peri splenic drain flushes with resistance Little to no OP-- bloody inJP  NG on Cx  Vitals reviewed.   Imaging: Dg Chest Port 1 View  Result Date: 07/29/2018 CLINICAL DATA:  Tachypnea. EXAM: PORTABLE CHEST 1 VIEW COMPARISON:  07/28/2018 FINDINGS: Cardiac silhouette is normal in size. No mediastinal or hilar masses. Mild hazy left perihilar and lower lung zone opacity consistent with a combination of pleural fluid and dependent atelectasis. Minor linear atelectasis adjacent to minor fissure on the  right. Lung findings stable from the previous day's exam. No new lung abnormalities.  No pneumothorax. Upper abdominal pigtail catheters are unchanged. Right PICC is stable. The endotracheal and nasal/orogastric tubes have been removed. IMPRESSION: 1. Status post removal of the endotracheal tube and nasal/orogastric tube. 2. No other significant change from the previous day's study. Electronically Signed   By: Lajean Manes M.D.   On: 07/29/2018 15:28    Labs:  CBC: Recent Labs    07/27/18 0404 07/27/18 1905 07/28/18 0402 07/29/18 0347 08/01/18 0433  WBC 15.4*  --  14.6* 11.6* 13.6*  HGB 6.3* 7.6* 8.1* 7.7* 8.0*  HCT 20.6* 23.5* 25.4* 24.3* 26.1*  PLT 404*  --  391 411* 520*    COAGS: Recent Labs    07/18/2018 0701 07/03/2018 0748 07/14/2018 1402 07/21/18 0352  INR 1.13  --  1.23 1.50  APTT  --  33  --   --     BMP: Recent Labs    07/29/18 0347 07/30/18 0418 07/31/18 0632 08/01/18 0433  NA 145  142 143 145 147*  K 3.5  3.3* 3.5 4.3 3.2*  CL 113*  111 115* 116* 115*  CO2 20*  19* 18* 19* 15*  GLUCOSE 214*  208* 328* 252* 242*  BUN 101*  98* 100* 112* 119*  CALCIUM 8.6*  8.3* 8.9 9.3 8.8*  CREATININE 4.16*  4.10* 3.92* 3.41* 3.43*  GFRNONAA 14*  14* 15* 18* 18*  GFRAA 16*  16* 17* 21* 20*    LIVER FUNCTION TESTS: Recent Labs    07/19/18 0009 07/21/18 0035 07/23/18 0346 07/25/18 0500  07/29/18 0347 07/30/18 0418 07/31/18 0632 08/01/18 0433  BILITOT 1.9* 1.4* 1.0 1.0  --   --   --   --   --   AST 137* 148* 94* 138*  --   --   --   --   --   ALT 94* 137* 114* 105*  --   --   --   --   --   ALKPHOS 119 128* 125 119  --   --   --   --   --   PROT 7.8 8.1 8.8* 8.9*  --   --   --   --   --   ALBUMIN 2.5* 2.4* 2.1* 2.0*   < > 1.4* 1.7* 1.6* 1.7*   < > = values in this interval not displayed.    Assessment and Plan:  Perihepatic abscess drain Perisplenic abscess drain Both intact Little to no OP for both Plan per attending  Electronically  Signed: Winton Offord A, PA-C 08/01/2018, 3:44 PM   I spent a total of 15 Minutes at the the patient's bedside AND on the patient's hospital floor or unit, greater than 50% of which was counseling/coordinating care for perihep and perisplenic drains

## 2018-08-01 NOTE — Progress Notes (Signed)
PROGRESS NOTE    Jerry Green  PXT:[id number] DOB: August 25, 1956 DOA: 07/29/2018 PCP: Ollen Bowl, MD  Outpatient Specialists:     Brief Narrative:  Jerry Green is a 62 y.o. male with medical history significant of HTN, DM type 2, and Gerd; who initially presented to [facility] with complaints of 3-4 day progressively worsening right lower quadrant abdominal pain.  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  He was taken to the OR on 10/9.  Patient underwent exploratory laparotomy, left colectomy and biopsy of the liver lesions.  Patient has large and small bowel obstruction secondary to metastatic adenopathy of the colon.  There is also mention of carcinomatosis.  Patient has also had intra-abdominal abscesses with drains placed.  Patient was intubated on 07/25/2018 due to respiratory insufficiency, and has been extubated.  Guarded prognosis.  Palliative care input is awaited.   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  Stage IV adenocarcinoma of colon Multi-system organ failure Encephalopathy Acute kidney injury Sepsis without shock due to intra  abdominal abscesses. Moderate malnutrition. Type 2 diabetes with hyperglycemia due to TPN and steroids   Plan BiPAP Continue to minimize narcotics  TPN discontinued as past surgical team. Guarded prognosis Palliative care consult Continue with Zyvox, Zosyn and Eraxis  DVT prophylaxis: Code Status: DO NOT RESUSCITATE Family Communication:  Disposition Plan: Will depend on the goal of care   Consultants:   Palliative care  Procedures:   See above.  Antimicrobials:   IV Zyvox  IV Zosyn  IV Eraxis   Subjective: No history from patient  Objective: Vitals:   08/01/18 0600 08/01/18 0700 08/01/18 0743 08/01/18 0800  BP: 133/85 130/87  135/88  Pulse: 88 83  88  Resp: (!) 24 (!) 27  (!) 34  Temp: 98.4 F (36.9 C) 98.4 F (36.9 C)  98.6 F (37 C)  TempSrc:      SpO2: 97% 100% 99% 100%  Weight:      Height:        Intake/Output Summary (Last 24 hours) at 08/01/2018 1134 Last data filed at 08/01/2018 0600 Gross per 24 hour  Intake 2849.48 ml  Output 3495 ml  Net -645.52 ml   Filed Weights   07/27/18 0410 07/28/18 0400 07/29/18 0357  Weight: (!) 159.2 kg (!) 153.8 kg (!) 153.8 kg    Examination:  General exam: Encephalopathic.    Respiratory system: Clear to auscultation. Respiratory effort normal. Cardiovascular system: S1 & S2 heard. Gastrointestinal system: Abdomen is tense  Central nervous system: Patient remains encephalopathic.   Extremities: Mild leg edema.     Data Reviewed: I have personally reviewed following labs and imaging studies  CBC:  Recent Labs  Lab 07/26/18 0335 07/27/18 0404 07/27/18 1905 07/28/18 0402 07/29/18 0347 08/01/18 0433  WBC 16.9* 15.4*  --  14.6* 11.6* 13.6*  NEUTROABS 12.9* 11.4*  --  11.4* 9.4* 10.6*  HGB 7.7* 6.3* 7.6* 8.1* 7.7* 8.0*  HCT 26.4* 20.6* 23.5* 25.4* 24.3* 26.1*  MCV 95.7 92.0  --  90.1 90.7 91.3  PLT 462* 404*  --  391 411* 425*   Basic Metabolic Panel: Recent Labs  Lab 07/26/18 0335  07/27/18 0404 07/28/18 0402 07/29/18 0347 07/30/18 0418 07/31/18 0632 08/01/18 0433  NA 148* 147* 143 145  142 143 145 147*  K 3.9 3.2* 3.3* 3.5  3.3* 3.5 4.3 3.2*  CL 125* 113* 110 113*  111 115* 116* 115*  CO2 15* 23 22 20*  19* 18* 19* 15*  GLUCOSE 248* 78 124* 214*  208* 328* 252* 242*  BUN 98* 99* 99* 101*  98* 100* 112* 119*  CREATININE 4.61* 5.17* 4.70* 4.16*  4.10* 3.92* 3.41* 3.43*  CALCIUM 8.7* 8.2* 8.2* 8.6*  8.3* 8.9 9.3 8.8*  MG 1.7 1.7 1.9 2.0  --  2.4  --   PHOS 4.5 4.6 5.7* 4.4  4.4 2.4* 3.9 3.0   GFR: Estimated Creatinine Clearance: 34.5 mL/min (A) (by C-G formula based on SCr of 3.43 mg/dL (H)). Liver Function Tests: Recent Labs  Lab 07/28/18 0402 07/29/18 0347 07/30/18 0418 07/31/18 9563 08/01/18 0433  ALBUMIN 1.6* 1.4* 1.7* 1.6* 1.7*   No results for input(s): LIPASE, AMYLASE in the last 168 hours. No results for input(s): AMMONIA in the last 168 hours. Coagulation Profile: No results for input(s): INR, PROTIME in the last 168 hours. Cardiac Enzymes: No results for input(s): CKTOTAL, CKMB, CKMBINDEX, TROPONINI in the last 168 hours. BNP (last 3 results) No results for input(s): PROBNP in the last 8760 hours. HbA1C: No results for input(s): HGBA1C in the last 72 hours. CBG: Recent Labs  Lab 08/01/18 0048 08/01/18 0438 08/01/18 0450 08/01/18 0736 08/01/18 1124  GLUCAP 275* >600* 192* 177* 172*   Lipid Profile: Recent Labs    07/30/18 0418  TRIG 236*   Thyroid Function Tests: No results for input(s): TSH, T4TOTAL, FREET4, T3FREE, THYROIDAB in the last 72 hours. Anemia Panel: No results for input(s): VITAMINB12, FOLATE, FERRITIN, TIBC, IRON, RETICCTPCT in the last 72 hours. Urine analysis:    Component Value Date/Time   COLORURINE YELLOW 07/26/2018 1547   APPEARANCEUR CLOUDY (A) 07/26/2018 1547   LABSPEC 1.017 07/26/2018 1547   PHURINE 5.0 07/26/2018 1547   GLUCOSEU NEGATIVE 07/26/2018 1547   HGBUR LARGE (A) 07/26/2018 1547     BILIRUBINUR NEGATIVE 07/26/2018 1547   KETONESUR NEGATIVE 07/26/2018 1547   PROTEINUR 30 (A) 07/26/2018 1547   NITRITE NEGATIVE 07/26/2018 1547   LEUKOCYTESUR TRACE (A) 07/26/2018 1547   Sepsis Labs: @LABRCNTIP (procalcitonin:4,lacticidven:4)  ) Recent Results (from the past 240 hour(s))  Culture, blood (Routine X 2) w Reflex to ID Panel     Status: None   Collection Time: 07/26/18  3:23 PM  Result Value Ref Range Status   Specimen Description BLOOD LEFT HAND  Final   Special Requests   Final    BOTTLES DRAWN AEROBIC ONLY Blood Culture adequate volume   Culture   Final    NO GROWTH 5 DAYS Performed at [facility], [place] [address] [place], [place] [address]    Report Status 07/31/2018 FINAL  Final  Culture, blood (Routine X 2) w Reflex to ID Panel  Status: None   Collection Time: 07/26/18  3:29 PM  Result Value Ref Range Status   Specimen Description BLOOD LEFT HAND  Final   Special Requests   Final    BOTTLES DRAWN AEROBIC ONLY Blood Culture adequate volume   Culture   Final    NO GROWTH 5 DAYS Performed at [facility], [address], [place], [place] [address]    Report Status 07/31/2018 FINAL  Final  Culture, Urine     Status: None   Collection Time: 07/26/18  3:47 PM  Result Value Ref Range Status   Specimen Description URINE, CATHETERIZED  Final   Special Requests NONE  Final   Culture   Final    NO GROWTH Performed at [facility], [place] [address] [place], [place] [address]    Report Status 07/27/2018 FINAL  Final  Culture, respiratory (non-expectorated)     Status: None   Collection Time: 07/26/18  6:46 PM  Result Value Ref Range Status   Specimen Description TRACHEAL ASPIRATE  Final   Special Requests NONE  Final   Gram Stain   Final    FEW WBC PRESENT, PREDOMINANTLY PMN NO ORGANISMS SEEN    Culture   Final    RARE Consistent with normal respiratory flora. Performed at [facility], [place] [address], [place], [place]  [address]    Report Status 07/29/2018 FINAL  Final         Radiology Studies: No results found.      Scheduled Meds: . chlorhexidine gluconate (MEDLINE KIT)  15 mL Mouth Rinse BID  . heparin injection (subcutaneous)  5,000 Units Subcutaneous Q8H  . insulin aspart  0-20 Units Subcutaneous Q4H  . insulin glargine  25 Units Subcutaneous Daily  . sodium bicarbonate  100 mEq Intravenous Once   Continuous Infusions: . sodium chloride Stopped (07/29/18 1541)  . sodium chloride Stopped (07/31/18 1141)  . anidulafungin Stopped (07/31/18 1621)  . linezolid (ZYVOX) IV 600 mg (08/01/18 0807)  . piperacillin-tazobactam (ZOSYN)  IV 3.375 g (08/01/18 0803)  . TPN ADULT (ION)       LOS: 22 days    Time spent: 67 Minutes    Dana Allan, MD  [facility] Pager #: [phone] 7PM-7AM contact night coverage as above

## 2018-08-01 NOTE — Progress Notes (Signed)
21 Days Post-Op   Subjective/Chief Complaint: More confused according to the nurse    Objective: Vital signs in last 24 hours: Temp:  [98.4 F (36.9 C)-99.5 F (37.5 C)] 98.6 F (37 C) (10/30 0800) Pulse Rate:  [81-102] 88 (10/30 0800) Resp:  [16-34] 34 (10/30 0800) BP: (124-148)/(72-95) 135/88 (10/30 0800) SpO2:  [97 %-100 %] 100 % (10/30 0800) Last BM Date: 07/31/18  Intake/Output from previous day: 10/29 0701 - 10/30 0700 In: 3509 [I.V.:2328.7; IV Piggyback:1180.3] Out: 4600 [Urine:4185; Stool:415] Intake/Output this shift: No intake/output data recorded.  Wound open and barrier in place  Dehiscence noted    Tissue healthy ostomy functioning and viable   Lab Results:  Recent Labs    08/01/18 0433  WBC 13.6*  HGB 8.0*  HCT 26.1*  PLT 520*   BMET Recent Labs    07/31/18 0632 08/01/18 0433  NA 145 147*  K 4.3 3.2*  CL 116* 115*  CO2 19* 15*  GLUCOSE 252* 242*  BUN 112* 119*  CREATININE 3.41* 3.43*  CALCIUM 9.3 8.8*   PT/INR No results for input(s): LABPROT, INR in the last 72 hours. ABG No results for input(s): PHART, HCO3 in the last 72 hours.  Invalid input(s): PCO2, PO2  Studies/Results: No results found.  Anti-infectives: Anti-infectives (From admission, onward)   Start     Dose/Rate Route Frequency Ordered Stop   07/27/18 1100  linezolid (ZYVOX) IVPB 600 mg     600 mg 300 mL/hr over 60 Minutes Intravenous Every 12 hours 07/27/18 1031     07/23/18 1500  anidulafungin (ERAXIS) 100 mg in sodium chloride 0.9 % 100 mL IVPB     100 mg 78 mL/hr over 100 Minutes Intravenous Every 24 hours 07/22/18 1420     07/22/18 2000  DAPTOmycin (CUBICIN) 850 mg in sodium chloride 0.9 % IVPB  Status:  Discontinued     850 mg 234 mL/hr over 30 Minutes Intravenous Every 48 hours 07/22/18 1459 07/27/18 1031   07/22/18 1530  anidulafungin (ERAXIS) 200 mg in sodium chloride 0.9 % 200 mL IVPB     200 mg 78 mL/hr over 200 Minutes Intravenous  Once 07/22/18 1420  07/22/18 1941   07/23/2018 1800  piperacillin-tazobactam (ZOSYN) IVPB 3.375 g     3.375 g 12.5 mL/hr over 240 Minutes Intravenous Every 8 hours 07/18/2018 1444     07/03/2018 0915  cefoTEtan (CEFOTAN) 2 g in sodium chloride 0.9 % 100 mL IVPB     2 g 200 mL/hr over 30 Minutes Intravenous To Short Stay 07/25/2018 0857 08/02/2018 1900   07/28/2018 0906  cefoTEtan in Dextrose 5% (CEFOTAN) 2-2.08 GM-%(50ML) IVPB    Note to Pharmacy:  [name]   : cabinet override      07/24/2018 0906 07/08/2018 2114   07/03/2018 1200  piperacillin-tazobactam (ZOSYN) IVPB 3.375 g  Status:  Discontinued     3.375 g 12.5 mL/hr over 240 Minutes Intravenous Every 8 hours 07/14/2018 0615 07/16/2018 1341   07/25/2018 0730  cefoTEtan (CEFOTAN) 2 g in sodium chloride 0.9 % 100 mL IVPB  Status:  Discontinued     2 g 200 mL/hr over 30 Minutes Intravenous On call to O.R. 08/02/2018 0723 08/02/2018 0559   07/03/2018 0615  piperacillin-tazobactam (ZOSYN) IVPB 3.375 g     3.375 g 100 mL/hr over 30 Minutes Intravenous  Once 07/05/2018 0612 07/26/2018 1943      Assessment/Plan: s/p Procedure(s): EXPLORATORY LAPAROTOMY (N/A) PARTIAL COLON RESECTION WITH COLOSTOMY (N/A) LIVER BIOPSY (N/A) APPLICATION OF  WOUND VAC (N/A) Acute respiratory failure - on Vent - extubated 10/13, 10/27 Acute renal failure  Hypertension  Diabetes  GERD  Remote tobacco use  BMI 36  Anemia - transfused  Hypokalemia - resolved  Hypernatremia  Hyperchloremia  Malnutrition - moderate - prealbumin 12.2  Hypernatremia -151  Post op ileus, improving  Confusion  Leukocytosis  PNA  Perforated splenic flexure colon cancer with large and small bowel obstruction  Metastatic Colon adenocarcinoma - liver Bx also positive S/P Exploratory laparotomy, left colectomy and liver biopsy, 07/22/2018, Dr. Rolm Bookbinder POD# 20 Abdominal wound dehiscence--no evisceration. Does not need any reoperative surgery.  -mepitel added over base of wound where bowel is present to hopefully  prevent fistulization  IR abdominal drain placement x 2 - 07/20/18 - minimal output, CX no growth -patient on comfort measures and pursuing hospice care.  -consider DC TNA, given comfort measures -consider comfort feeds/sips of liquids at least to keep him comfortable.    FEN: on TNA, but highly recommend discontinuation given he is comfort care/IVFs/can have comfort feeds ID: Zosyn 10/8 -->; Cubacin 10/20 -->eraxis 10/21 --> DVT: SCD/subcutaneous heparin  Foley: present  Follow up: Dr. Gertie Gowda he survives  LOS: 22 days    Jerry Green A Fransico Sciandra 08/01/2018

## 2018-08-01 NOTE — Progress Notes (Signed)
Pt transported to 6N01 with no adverse events.

## 2018-08-02 DIAGNOSIS — K9189 Other postprocedural complications and disorders of digestive system: Secondary | ICD-10-CM

## 2018-08-02 DIAGNOSIS — K567 Ileus, unspecified: Secondary | ICD-10-CM

## 2018-08-02 LAB — RENAL FUNCTION PANEL
ANION GAP: 10 (ref 5–15)
Albumin: 2 g/dL — ABNORMAL LOW (ref 3.5–5.0)
BUN: 123 mg/dL — ABNORMAL HIGH (ref 8–23)
CHLORIDE: 126 mmol/L — AB (ref 98–111)
CO2: 14 mmol/L — ABNORMAL LOW (ref 22–32)
Calcium: 9.5 mg/dL (ref 8.9–10.3)
Creatinine, Ser: 3.56 mg/dL — ABNORMAL HIGH (ref 0.61–1.24)
GFR calc Af Amer: 19 mL/min — ABNORMAL LOW (ref >60)
GFR, EST NON AFRICAN AMERICAN: 17 mL/min — AB (ref >60)
Glucose, Bld: 127 mg/dL — ABNORMAL HIGH (ref 70–99)
POTASSIUM: 3.7 mmol/L (ref 3.5–5.1)
Phosphorus: 5 mg/dL — ABNORMAL HIGH (ref 2.5–4.6)
Sodium: 150 mmol/L — ABNORMAL HIGH (ref 135–145)

## 2018-08-02 LAB — GLUCOSE, CAPILLARY
GLUCOSE-CAPILLARY: 115 mg/dL — AB (ref 70–99)
GLUCOSE-CAPILLARY: 143 mg/dL — AB (ref 70–99)
GLUCOSE-CAPILLARY: 156 mg/dL — AB (ref 70–99)
GLUCOSE-CAPILLARY: 228 mg/dL — AB (ref 70–99)
Glucose-Capillary: >600 mg/dL (ref 70–99)
Glucose-Capillary: 167 mg/dL — ABNORMAL HIGH (ref 70–99)
Glucose-Capillary: 114 mg/dL — ABNORMAL HIGH (ref 70–99)

## 2018-08-02 LAB — CBC WITH DIFFERENTIAL/PLATELET
ABS IMMATURE GRANULOCYTES: 0.22 10*3/uL — AB (ref 0.00–0.07)
BASOS ABS: 0 10*3/uL (ref 0.0–0.1)
BASOS PCT: 0 %
Eosinophils Absolute: 0.2 10*3/uL (ref 0.0–0.5)
Eosinophils Relative: 1 %
HCT: 29.5 % — ABNORMAL LOW (ref 39.0–52.0)
HEMOGLOBIN: 9.3 g/dL — AB (ref 13.0–17.0)
Immature Granulocytes: 2 %
LYMPHS PCT: 14 %
Lymphs Abs: 1.9 10*3/uL (ref 0.7–4.0)
MCH: 28.5 pg (ref 26.0–34.0)
MCHC: 31.5 g/dL (ref 30.0–36.0)
MCV: 90.5 fL (ref 80.0–100.0)
Monocytes Absolute: 1.1 10*3/uL — ABNORMAL HIGH (ref 0.1–1.0)
Monocytes Relative: 8 %
NEUTROS PCT: 75 %
Neutro Abs: 10.5 10*3/uL — ABNORMAL HIGH (ref 1.7–7.7)
PLATELETS: 540 10*3/uL — AB (ref 150–400)
RBC: 3.26 MIL/uL — AB (ref 4.22–5.81)
RDW: 15.2 % (ref 11.5–15.5)
WBC: 14 10*3/uL — AB (ref 4.0–10.5)
nRBC: 0.6 % — ABNORMAL HIGH (ref 0.0–0.2)

## 2018-08-02 LAB — MAGNESIUM: MAGNESIUM: 2.3 mg/dL (ref 1.7–2.4)

## 2018-08-02 MED ORDER — ADULT MULTIVITAMIN W/MINERALS CH
1.0000 | ORAL_TABLET | Freq: Every day | ORAL | Status: DC
  Administered 2018-08-03 – 2018-08-04 (×2): 1 via ORAL
  Filled 2018-08-02 (×3): qty 1

## 2018-08-02 MED ORDER — BOOST / RESOURCE BREEZE PO LIQD CUSTOM
1.0000 | Freq: Three times a day (TID) | ORAL | Status: DC
  Administered 2018-08-02 – 2018-08-04 (×5): 1 via ORAL

## 2018-08-02 NOTE — Progress Notes (Signed)
PROGRESS NOTE  Jerry Green  TOI:[id number] DOB: [birth year] DOA: 07/14/2018 PCP: Ollen Bowl, MD   Brief Narrative: Jerry Green is a 62 y.o. male with a history of T2DM, HTN, and GERD who presented initially to [facility] in [place] with progressive RLQ abdominal pain, weight loss found to have a perforated obstructing splenic flexure mass with evidence of lymphatic and liver metastases and peritoneal carcinomatosis. Due to no GI availability, he was transferred to [facility], arriving 10/8 and taken for exploratory laparotomy, hemicolectomy and liver biopsy 10/9, open abdominal wound with vac. He was kept in the ICU, intubated on pressors with evidence of AKI. TNA started 10/15. Pathology returned with adenocarcinoma of the colon and liver biopsy also positive. Oncology was consulted and recommended initiating chemotherapy in the outpatient setting pending clinical course. Unfortunately, the postoperative course was complicated by encephalopathy, respiratory failure, peritonitis with multifocal abscesses for which drains were placed 10/18, and acute renal failure. Nephrology was consulted but did not recommend initiation of dialysis due to stage IV colon cancer. Supportive measures were continued including prolonged antibiotics/antifungal per ID, ventilatory support/reintubation 10/24 due to decreased mental status, bicarbonate . CT of the chest also demonstrated pulmonary nodules suspected metastases. Palliative care was consulted on 10/24, discussed grim prognosis with his HCPOA and only child, [name], who requested the patient be DNR but continue aggressive interventions. He required transfusions for anemia and developed fevers but ultimately stabilized enough to be weaned from the vent 10/27 and extubated to BiPAP and ultimately to supplemental oxygen alone. Encephalopathy has not improved and work up for reversible causes has been negative to date. The daughter had requested  comfort measures per report, though both I and palliative care have been unable to confirm that at this time. His respiratory status is stabilizing and renal impairment is not worsening, though remains abnormal.   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  - continue supplemental oxygen prn - Monitor fluid status, avoid overload.   Acute metabolic encephalopathy: Likely also complicated by acute delirium, though is certainly multifactorial, namely due to sepsis, possibly uremia. MRI without acute findings 10/22 nonacute, volume loss in brainstem and cerebellum as well as mild-moderate white matter changes consistent with small vessel disease. B12 405 - Check TSH for thoroughness.  Sepsis due to peritonitis, intraabdominal abscesses due to perforated colon: s/p left hemicolectomy. Culture data unhelpful. Has not had resolution of leukocytosis. - Continue broad antimicrobials (linezolid, zosyn, eraxis directed by ID. If truly converting to comfort measures only, would stop these - Wound dehiscence noted, no evisceration, care per general surgery. - Drain output declining, care per IR. - Diet advanced based on goals of care discussions, comfort feeds ok, stopped  TPN.  Acute renal failure:  - Not a  HD candidate per nephrology - Monitor UOP, creatinine. - Avoid hypotension, nephrotoxins - With acidosis, would consider bicarb if GOC is to reescalate care. No hyperkalemia.   Stage IV adenocarcinoma: CEA 94.4. - Oncology has been following, currently not a candidate for therapy. Pending clinical course, could consider outpatient follow up more near his home.  - Overall goals of care discussed with patient's brother at bedside who is local. The patient's daughter is Media planner, lives in [place], near where the patient lived PTA, and did not answer the phone this morning.   T2DM: HbA1c 7.4%, hyperglycemic here due to stress, infection, steroids.  - Continue resistant scale SSI and lantus, mostly at inpatient goal. Aim to avoid hypoglycemia as po intake is unclear at this time.  Moderate protein-calorie malnutrition:  - Diet as tolerated including protein supplementation   Acute blood loss anemia on anemia of acute and chronic disease: Hgb stable, required transfusions 10/13 and 10/25 - Monitor. No active bleeding noted.  DVT prophylaxis: Heparin Code Status: DNR Family Communication: Brother at bedside, no answer from daughter.  Disposition Plan: Pending clinical course and goals of care.   Consultants:   PCCM  IR  General surgery  Palliative care  Procedures:  07/16/2018: Dr. Donne Hazel.  1.  Exploratory laparotomy 2.  Left colectomy 3.  Biopsy of liver lesion  10/9 ETT >> 10/9 NGT >> 10/9 Foley >> 10/9 R DL IJ CVC >> 10/14 PICC placed 10/15 TNA initiated  Antimicrobials: Zosyn 10/8 >> Linezolid 10/25 >>  Anidulafungin 10/20 >>   Subjective: Pt confused limiting history. Brother at bedside says this has been his new baseline, was hallucinating previously, is calm now. Denies any pain, taking some po.  Objective: Vitals:   08/01/18 1500 08/01/18 1600 08/01/18 2040 08/02/18 0507  BP: 131/84 (!) 146/93 (!) 132/91 (!) 135/91   Pulse: 86 90 91 92  Resp: (!) 34 (!) 33 20 18  Temp: 99 F (37.2 C) 99 F (37.2 C) 98.4 F (36.9 C) 98.3 F (36.8 C)  TempSrc:   Oral Oral  SpO2: 100% 99% 98% 98%  Weight:      Height:        Intake/Output Summary (Last 24 hours) at 08/02/2018 1539 Last data filed at 08/02/2018 1238 Gross per 24 hour  Intake 237 ml  Output 3350 ml  Net -3113 ml   Filed Weights   07/27/18 0410 07/28/18 0400 07/29/18 0357  Weight: (!) 159.2 kg (!) 153.8 kg (!) 153.8 kg   Gen: Confused morbidly obese male in no distress Pulm: Non-labored breathing. Clear to auscultation bilaterally.  CV: Regular rate and rhythm. No murmur, rub, or gallop. No JVD, trace bilateral pedal edema. GI: Abdomen is distended with open midline wound open with barrier in place. Ostomy without significant surrounding erythema and no purulence. Drain sites c/d/i. +BS. Ext: Warm, no deformities Skin: As above Neuro: Alert, not oriented. Not cooperative with exam but moving all extremities well Psych: Judgement and insight impaired.  Data Reviewed: I have personally reviewed following labs and imaging studies  CBC: Recent Labs  Lab 07/27/18 0404 07/27/18 1905 07/28/18 0402 07/29/18 0347 08/01/18 0433 08/02/18 0434  WBC 15.4*  --  14.6* 11.6* 13.6* 14.0*  NEUTROABS 11.4*  --  11.4* 9.4* 10.6* 10.5*  HGB 6.3* 7.6* 8.1* 7.7* 8.0* 9.3*  HCT 20.6* 23.5* 25.4* 24.3* 26.1* 29.5*  MCV 92.0  --  90.1 90.7 91.3 90.5  PLT 404*  --  391 411* 520* 540*   Basic  Metabolic Panel: Recent Labs  Lab 07/27/18 0404 07/28/18 0402 07/29/18 0347 07/30/18 0418 07/31/18 0632 08/01/18 0433 08/02/18 0434  NA 147* 143 145  142 143 145 147* 150*  K 3.2* 3.3* 3.5  3.3* 3.5 4.3 3.2* 3.7  CL 113* 110 113*  111 115* 116* 115* 126*  CO2 23 22 20*  19* 18* 19* 15* 14*  GLUCOSE 78 124* 214*  208* 328* 252* 242* 127*  BUN 99* 99* 101*  98* 100* 112* 119* 123*  CREATININE 5.17* 4.70* 4.16*  4.10* 3.92* 3.41* 3.43* 3.56*  CALCIUM  8.2* 8.2* 8.6*  8.3* 8.9 9.3 8.8* 9.5  MG 1.7 1.9 2.0  --  2.4  --  2.3  PHOS 4.6 5.7* 4.4  4.4 2.4* 3.9 3.0 5.0*   GFR: Estimated Creatinine Clearance: 33.3 mL/min (A) (by C-G formula based on SCr of 3.56 mg/dL (H)). Liver Function Tests: Recent Labs  Lab 07/29/18 0347 07/30/18 0418 07/31/18 7939 08/01/18 0433 08/02/18 0434  ALBUMIN 1.4* 1.7* 1.6* 1.7* 2.0*   No results for input(s): LIPASE, AMYLASE in the last 168 hours. No results for input(s): AMMONIA in the last 168 hours. Coagulation Profile: No results for input(s): INR, PROTIME in the last 168 hours. Cardiac Enzymes: No results for input(s): CKTOTAL, CKMB, CKMBINDEX, TROPONINI in the last 168 hours. BNP (last 3 results) No results for input(s): PROBNP in the last 8760 hours. HbA1C: No results for input(s): HGBA1C in the last 72 hours. CBG: Recent Labs  Lab 08/01/18 2017 08/02/18 0013 08/02/18 0500 08/02/18 0845 08/02/18 1222  GLUCAP 125* 156* 115* 167* 228*   Lipid Profile: No results for input(s): CHOL, HDL, LDLCALC, TRIG, CHOLHDL, LDLDIRECT in the last 72 hours. Thyroid Function Tests: No results for input(s): TSH, T4TOTAL, FREET4, T3FREE, THYROIDAB in the last 72 hours. Anemia Panel: No results for input(s): VITAMINB12, FOLATE, FERRITIN, TIBC, IRON, RETICCTPCT in the last 72 hours. Urine analysis:    Component Value Date/Time   COLORURINE YELLOW 07/26/2018 1547   APPEARANCEUR CLOUDY (A) 07/26/2018 1547   LABSPEC 1.017 07/26/2018 1547   PHURINE 5.0 07/26/2018 1547   GLUCOSEU NEGATIVE 07/26/2018 1547   HGBUR LARGE (A) 07/26/2018 1547   BILIRUBINUR NEGATIVE 07/26/2018 1547   KETONESUR NEGATIVE 07/26/2018 1547   PROTEINUR 30 (A) 07/26/2018 1547   NITRITE NEGATIVE 07/26/2018 1547   LEUKOCYTESUR TRACE (A) 07/26/2018 1547   Recent Results (from the past 240 hour(s))  Culture, blood (Routine X 2) w Reflex to ID Panel     Status: None   Collection Time: 07/26/18  3:23 PM  Result Value Ref Range Status     Specimen Description BLOOD LEFT HAND  Final   Special Requests   Final    BOTTLES DRAWN AEROBIC ONLY Blood Culture adequate volume   Culture   Final    NO GROWTH 5 DAYS Performed at [facility], [place] [address], [place], [place] [address]    Report Status 07/31/2018 FINAL  Final  Culture, blood (Routine X 2) w Reflex to ID Panel     Status: None   Collection Time: 07/26/18  3:29 PM  Result Value Ref Range Status   Specimen Description BLOOD LEFT HAND  Final   Special Requests   Final    BOTTLES DRAWN AEROBIC ONLY Blood Culture adequate volume   Culture   Final    NO GROWTH 5 DAYS Performed at [facility], [place] [address], [place], [place] [address]    Report Status 07/31/2018 FINAL  Final  Culture, Urine     Status: None   Collection Time: 07/26/18  3:47 PM  Result Value Ref Range Status   Specimen Description URINE, CATHETERIZED  Final   Special Requests NONE  Final   Culture   Final    NO GROWTH Performed at [facility], [address], [place], [place] [address]    Report Status 07/27/2018 FINAL  Final  Culture, respiratory (non-expectorated)     Status: None   Collection Time: 07/26/18  6:46 PM  Result Value Ref Range Status   Specimen Description TRACHEAL ASPIRATE  Final   Special Requests NONE  Final   Gram Stain   Final    FEW WBC PRESENT, PREDOMINANTLY PMN NO ORGANISMS SEEN    Culture   Final    RARE Consistent with normal respiratory flora. Performed at [facility], [place] [address] [place], [place] [address]    Report Status 07/29/2018 FINAL  Final      Radiology Studies: No results found.  Scheduled Meds: . chlorhexidine gluconate (MEDLINE KIT)  15 mL Mouth Rinse BID  . feeding supplement  1 Container Oral TID BM  . heparin injection (subcutaneous)  5,000 Units Subcutaneous Q8H  . insulin aspart  0-20 Units Subcutaneous Q4H  . insulin glargine  25 Units Subcutaneous Daily  . multivitamin with minerals  1 tablet Oral Daily    Continuous Infusions: . sodium chloride 250 mL (08/01/18 2242)  . anidulafungin 100 mg (08/02/18 1458)  . linezolid (ZYVOX) IV 600 mg (08/02/18 0956)  . piperacillin-tazobactam (ZOSYN)  IV 3.375 g (08/02/18 1104)     LOS: 23 days   Time spent: 35 minutes.  Patrecia Pour, MD [facility] [email or web address] Password [facility] 08/02/2018, 3:39 PM

## 2018-08-02 NOTE — Plan of Care (Signed)
  Problem: Nutrition: Goal: Adequate nutrition will be maintained Outcome: Progressing   Problem: Pain Managment: Goal: General experience of comfort will improve Outcome: Progressing   Problem: Safety: Goal: Ability to remain free from injury will improve Outcome: Progressing   Problem: Skin Integrity: Goal: Risk for impaired skin integrity will decrease Outcome: Progressing   Problem: Bowel/Gastric/Urinary: Goal: Gastrointestinal status for postoperative course will improve Outcome: Progressing   Problem: Clinical Measurements: Goal: Postoperative complications will be avoided or minimized Outcome: Progressing   Problem: Skin Integrity: Goal: Will show signs of wound healing Outcome: Progressing Goal: Risk for impaired skin integrity will decrease Outcome: Progressing

## 2018-08-02 NOTE — Progress Notes (Signed)
Nutrition Follow-up  DOCUMENTATION CODES:   Obesity unspecified  INTERVENTION:   -Boost Breeze po TID, each supplement provides 250 kcal and 9 grams of protein -MVI with minerals daily -RD will follow for diet advancement and GOC; will adjust supplement regimen as appropriate  NUTRITION DIAGNOSIS:   Inadequate oral intake related to poor appetite, nausea, vomiting as evidenced by per patient/family report.  Ongoing  GOAL:   Patient will meet greater than or equal to 90% of their needs  Progressing  MONITOR:   Diet advancement, PO intake, Labs, Weight trends, I & O's, Skin  REASON FOR ASSESSMENT:   Consult, Ventilator Enteral/tube feeding initiation and management  ASSESSMENT:   62 year old male who presented with abdominal pain. PMH significant for hypertension, type 2 diabetes mellitus, GERD. 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  10/9 - s/p ex lap, liver biopsy, colectomy with open abdomen and wound VAC 10/11 - start trickle feeds Vital HP @ 20 ml/hr, pt vomited, TF stopped 10/13 - extubated, TPNstarted 10/16 - TPN increased to 65 ml/hr 10/17 - TPNincreased to goal rate of 100 ml/hr, NGT pulled out and replaced 10/18-Perisplenic and hepatic abscess drains placed by IR;NGT pulled out 10/22- diet advanced to clears 10/23 Transfer to ICU, Intubated 10/25 Palliative care discussion, code status changed to DNR, no escalation of care, plan for further conversations regarding [place] in 48 hours 10/27 Extubated 10/29- advanced to clear liquid diet 10/30- TPN d/c  Pt resting quietly at time of visit. No family present. RD did not disturb. Noted that pt consumed 100% of chicken broth and two sodas on clear liquid tray.   Case discussed with RN, who reports pt is tolerating liquids very well. Palliative care continues to  follow pt; awaiting family discussion regarding further goals of care. Pt with poor prognosis.   Labs reviewed: Na: 150, Phos: 5.0, CBGS: 115-228 (inpatient orders for glycemic control are 0-20 units insulin aspart every 4 hours and 25 units insulin glargine daily).   Diet Order:   Diet Order            Diet clear liquid Room service appropriate? Yes; Fluid consistency: Thin  Diet effective now              EDUCATION NEEDS:   No education needs have been identified at this time  Skin:  Skin Assessment: Skin Integrity Issues: Skin Integrity Issues:: Other (Comment) Wound Vac: d/c Other: closed abdomen  Last BM:  10/29 via colostomy  Height:   Ht Readings from Last 1 Encounters:  07/25/18 6\' 2"  (1.88 m)    Weight:   Wt Readings from Last 1 Encounters:  07/29/18 (!) 153.8 kg    Ideal Body Weight:  80.91 kg  BMI:  Body mass index is 43.53 kg/m.  Estimated Nutritional Needs:   Kcal:  2200-2400  Protein:  120-135 grams  Fluid:  >2.2 L    Leah Skora A. Jimmye Norman, RD, LDN, CDE Pager: [phone] After hours Pager: [phone]

## 2018-08-02 NOTE — Plan of Care (Signed)
  Problem: Spiritual Needs Goal: Ability to function at adequate level Outcome: Not Progressing   Problem: Education: Goal: Knowledge of General Education information will improve Description Including pain rating scale, medication(s)/side effects and non-pharmacologic comfort measures  Outcome: Not Progressing   Problem: Health Behavior/Discharge Planning: Goal: Ability to manage health-related needs will improve Outcome: Not Progressing   Problem: Clinical Measurements: Goal: Ability to maintain clinical measurements within normal limits will improve Outcome: Not Progressing Goal: Will remain free from infection Outcome: Not Progressing

## 2018-08-02 NOTE — Progress Notes (Signed)
[facility] Progress Note  22 Days Post-Op  Subjective: CC:  No complaints. Drinking clears with assistance from tech.  Objective: Vital signs in last 24 hours: Temp:  [98.3 F (36.8 C)-99 F (37.2 C)] 98.3 F (36.8 C) (10/31 0507) Pulse Rate:  [80-92] 92 (10/31 0507) Resp:  [18-40] 18 (10/31 0507) BP: (131-146)/(73-93) 135/91 (10/31 0507) SpO2:  [98 %-100 %] 98 % (10/31 0507) Last BM Date: 08/02/18  Intake/Output from previous day: 10/30 0701 - 10/31 0700 In: -  Out: 3150 [Urine:2950; Stool:200] Intake/Output this shift: Total I/O In: -  Out: 900 [Urine:800; Stool:100]  PE: Gen:  Alert, NAD, pleasant Card:  Regular rate and rhythm, pedal pulses 2+ BL Pulm:  Normal effort, clear to auscultation bilaterally Abd: Soft, non-tender, dress clean and dry, gas in colostomy pouch stoma viable Skin: warm and dry, no rashes  Psych: A&Ox3    Lab Results:  Recent Labs    08/01/18 0433 08/02/18 0434  WBC 13.6* 14.0*  HGB 8.0* 9.3*  HCT 26.1* 29.5*  PLT 520* 540*   BMET Recent Labs    08/01/18 0433 08/02/18 0434  NA 147* 150*  K 3.2* 3.7  CL 115* 126*  CO2 15* 14*  GLUCOSE 242* 127*  BUN 119* 123*  CREATININE 3.43* 3.56*  CALCIUM 8.8* 9.5   PT/INR No results for input(s): LABPROT, INR in the last 72 hours. CMP     Component Value Date/Time   NA 150 (H) 08/02/2018 0434   K 3.7 08/02/2018 0434   CL 126 (H) 08/02/2018 0434   CO2 14 (L) 08/02/2018 0434   GLUCOSE 127 (H) 08/02/2018 0434   BUN 123 (H) 08/02/2018 0434   CREATININE 3.56 (H) 08/02/2018 0434   CALCIUM 9.5 08/02/2018 0434   PROT 8.9 (H) 07/25/2018 0500   ALBUMIN 2.0 (L) 08/02/2018 0434   AST 138 (H) 07/25/2018 0500   ALT 105 (H) 07/25/2018 0500   ALKPHOS 119 07/25/2018 0500   BILITOT 1.0 07/25/2018 0500   GFRNONAA 17 (L) 08/02/2018 0434   GFRAA 19 (L) 08/02/2018 0434   Lipase  No results found for: LIPASE     Studies/Results: No results  found.  Anti-infectives: Anti-infectives (From admission, onward)   Start     Dose/Rate Route Frequency Ordered Stop   07/27/18 1100  linezolid (ZYVOX) IVPB 600 mg     600 mg 300 mL/hr over 60 Minutes Intravenous Every 12 hours 07/27/18 1031     07/23/18 1500  anidulafungin (ERAXIS) 100 mg in sodium chloride 0.9 % 100 mL IVPB     100 mg 78 mL/hr over 100 Minutes Intravenous Every 24 hours 07/22/18 1420     07/22/18 2000  DAPTOmycin (CUBICIN) 850 mg in sodium chloride 0.9 % IVPB  Status:  Discontinued     850 mg 234 mL/hr over 30 Minutes Intravenous Every 48 hours 07/22/18 1459 07/27/18 1031   07/22/18 1530  anidulafungin (ERAXIS) 200 mg in sodium chloride 0.9 % 200 mL IVPB     200 mg 78 mL/hr over 200 Minutes Intravenous  Once 07/22/18 1420 07/22/18 1941   07/18/2018 1800  piperacillin-tazobactam (ZOSYN) IVPB 3.375 g     3.375 g 12.5 mL/hr over 240 Minutes Intravenous Every 8 hours 07/22/2018 1444     07/16/2018 0915  cefoTEtan (CEFOTAN) 2 g in sodium chloride 0.9 % 100 mL IVPB     2 g 200 mL/hr over 30 Minutes Intravenous To Short Stay 07/28/2018 0857 07/30/2018 1900   07/07/2018 0906  cefoTEtan in Dextrose 5% (CEFOTAN) 2-2.08 GM-%(50ML) IVPB    Note to Pharmacy:  [name]   : cabinet override      07/03/2018 0906 07/25/2018 2114   07/26/2018 1200  piperacillin-tazobactam (ZOSYN) IVPB 3.375 g  Status:  Discontinued     3.375 g 12.5 mL/hr over 240 Minutes Intravenous Every 8 hours 07/23/2018 0615 07/25/2018 1341   07/09/2018 0730  cefoTEtan (CEFOTAN) 2 g in sodium chloride 0.9 % 100 mL IVPB  Status:  Discontinued     2 g 200 mL/hr over 30 Minutes Intravenous On call to O.R. 07/27/2018 0723 07/28/2018 0559   07/19/2018 0615  piperacillin-tazobactam (ZOSYN) IVPB 3.375 g     3.375 g 100 mL/hr over 30 Minutes Intravenous  Once 07/31/2018 0612 07/25/2018 1943     Assessment/Plan s/p Procedure(s): EXPLORATORY LAPAROTOMY (N/A) PARTIAL COLON RESECTION WITH COLOSTOMY (N/A) LIVER BIOPSY (N/A) APPLICATION OF WOUND  VAC (N/A) Acute respiratory failure - on Vent - extubated 10/13, 10/27 Acute renal failure  Hypertension  Diabetes  GERD  Remote tobacco use  BMI 36  Anemia - transfused  Hypokalemia - resolved  Hypernatremia  Hyperchloremia  Malnutrition - moderate - prealbumin 12.2  Hypernatremia -151  Post op ileus, improving  Confusion  Leukocytosis  PNA  Perforated splenic flexure colon cancer with large and small bowel obstruction  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 Rolm Bookbinder POD#20 Abdominal wound dehiscence--no evisceration. Does not need any reoperative surgery.  -mepitel added over base of wound where bowel is present to hopefully prevent fistulization  - IR abdominal drain placement x 2 - 07/20/18 - minimal output, CX no growth - patient on comfort measures and pursuing hospice care.  - continue comfort feeds/sips of liquids  FEN: comfort feeds ID: Zosyn 10/8 -->; Cubacin 10/20 -->eraxis 10/21 --> DVT: SCD/subcutaneous heparin  Foley: present  Follow up: Dr. Gertie Gowda he survives   LOS: 23 days    Obie Dredge, [facility] Pager: [phone]

## 2018-08-03 LAB — CBC WITH DIFFERENTIAL/PLATELET
Abs Immature Granulocytes: 0.15 10*3/uL — ABNORMAL HIGH (ref 0.00–0.07)
BASOS ABS: 0 10*3/uL (ref 0.0–0.1)
BASOS PCT: 0 %
EOS PCT: 2 %
Eosinophils Absolute: 0.2 10*3/uL (ref 0.0–0.5)
HEMATOCRIT: 32.7 % — AB (ref 39.0–52.0)
Hemoglobin: 9.7 g/dL — ABNORMAL LOW (ref 13.0–17.0)
IMMATURE GRANULOCYTES: 1 %
LYMPHS ABS: 1.4 10*3/uL (ref 0.7–4.0)
Lymphocytes Relative: 11 %
MCH: 27.6 pg (ref 26.0–34.0)
MCHC: 29.7 g/dL — AB (ref 30.0–36.0)
MCV: 92.9 fL (ref 80.0–100.0)
Monocytes Absolute: 1.1 10*3/uL — ABNORMAL HIGH (ref 0.1–1.0)
Monocytes Relative: 8 %
NEUTROS PCT: 78 %
Neutro Abs: 10.1 10*3/uL — ABNORMAL HIGH (ref 1.7–7.7)
PLATELETS: 604 10*3/uL — AB (ref 150–400)
RBC: 3.52 MIL/uL — AB (ref 4.22–5.81)
RDW: 15.5 % (ref 11.5–15.5)
WBC: 13 10*3/uL — AB (ref 4.0–10.5)
nRBC: 0.3 % — ABNORMAL HIGH (ref 0.0–0.2)

## 2018-08-03 LAB — RENAL FUNCTION PANEL
ALBUMIN: 2 g/dL — AB (ref 3.5–5.0)
ANION GAP: 10 (ref 5–15)
BUN: 104 mg/dL — ABNORMAL HIGH (ref 8–23)
CO2: 16 mmol/L — AB (ref 22–32)
CREATININE: 3.58 mg/dL — AB (ref 0.61–1.24)
Calcium: 9.4 mg/dL (ref 8.9–10.3)
Chloride: 128 mmol/L — ABNORMAL HIGH (ref 98–111)
GFR calc Af Amer: 19 mL/min — ABNORMAL LOW (ref >60)
GFR, EST NON AFRICAN AMERICAN: 17 mL/min — AB (ref >60)
Glucose, Bld: 127 mg/dL — ABNORMAL HIGH (ref 70–99)
PHOSPHORUS: 5.7 mg/dL — AB (ref 2.5–4.6)
Potassium: 3.8 mmol/L (ref 3.5–5.1)
SODIUM: 154 mmol/L — AB (ref 135–145)

## 2018-08-03 LAB — GLUCOSE, CAPILLARY
GLUCOSE-CAPILLARY: 170 mg/dL — AB (ref 70–99)
GLUCOSE-CAPILLARY: 131 mg/dL — AB (ref 70–99)
Glucose-Capillary: 160 mg/dL — ABNORMAL HIGH (ref 70–99)
Glucose-Capillary: 110 mg/dL — ABNORMAL HIGH (ref 70–99)
Glucose-Capillary: 139 mg/dL — ABNORMAL HIGH (ref 70–99)
Glucose-Capillary: 125 mg/dL — ABNORMAL HIGH (ref 70–99)

## 2018-08-03 LAB — TSH: TSH: 2.395 u[IU]/mL (ref 0.350–4.500)

## 2018-08-03 NOTE — Progress Notes (Signed)
Referring Physician(s): Dr. Marthenia Rolling  Supervising Physician: Corrie Mckusick  Patient Status:  [facility] - In-pt  Chief Complaint: Perisplenic and periphepatic abscess s/p drain placement 10/18  Subjective: Patient grunts to touch, but not responsive otherwise.   Allergies: Aspirin  Medications: Prior to Admission medications   Medication Sig Start Date End Date Taking? Authorizing Provider  allopurinol (ZYLOPRIM) 100 MG tablet Take 100 mg by mouth daily. 06/14/18  Yes [provider]  enalapril (VASOTEC) 5 MG tablet Take 5 mg by mouth daily. 06/14/18  Yes [provider]  glimepiride (AMARYL) 2 MG tablet Take 2 mg by mouth 2 (two) times daily. 07/04/18  Yes [provider]  metFORMIN (GLUCOPHAGE) 1000 MG tablet Take 1,000 mg by mouth 2 (two) times daily. 06/14/18  Yes [provider]  pantoprazole (PROTONIX) 40 MG tablet Take 40 mg by mouth daily. 06/14/18  Yes [provider]  simvastatin (ZOCOR) 40 MG tablet Take 40 mg by mouth daily. 06/14/18  Yes [provider]     Vital Signs: BP 135/89 (BP Location: Left Arm)   Pulse 90   Temp 98.7 F (37.1 C) (Oral)   Resp 18   Ht 6\' 2"  (1.88 m)   Wt (!) 339 lb (153.8 kg)   SpO2 100%   BMI 43.53 kg/m   Physical Exam  NAD, somnolent Abdomen:  Both drains intact. Perihepatic drain with serosanguinous output.  Perisplenic drain with yellow output.   Imaging: No results found.  Labs:  CBC: Recent Labs    07/29/18 0347 08/01/18 0433 08/02/18 0434 08/03/18 0421  WBC 11.6* 13.6* 14.0* 13.0*  HGB 7.7* 8.0* 9.3* 9.7*  HCT 24.3* 26.1* 29.5* 32.7*  PLT 411* 520* 540* 604*    COAGS: Recent Labs    07/16/2018 0701 07/30/2018 0748 08/01/2018 1402 07/21/18 0352  INR 1.13  --  1.23 1.50  APTT  --  33  --   --     BMP: Recent Labs    07/31/18 0632 08/01/18 0433 08/02/18 0434 08/03/18 0421  NA 145 147* 150* 154*  K 4.3 3.2* 3.7 3.8  CL 116* 115* 126* 128*  CO2 19* 15* 14*  16*  GLUCOSE 252* 242* 127* 127*  BUN 112* 119* 123* 104*  CALCIUM 9.3 8.8* 9.5 9.4  CREATININE 3.41* 3.43* 3.56* 3.58*  GFRNONAA 18* 18* 17* 17*  GFRAA 21* 20* 19* 19*    LIVER FUNCTION TESTS: Recent Labs    07/19/18 0009 07/21/18 0035 07/23/18 0346 07/25/18 0500  07/31/18 0272 08/01/18 0433 08/02/18 0434 08/03/18 0421  BILITOT 1.9* 1.4* 1.0 1.0  --   --   --   --   --   AST 137* 148* 94* 138*  --   --   --   --   --   ALT 94* 137* 114* 105*  --   --   --   --   --   ALKPHOS 119 128* 125 119  --   --   --   --   --   PROT 7.8 8.1 8.8* 8.9*  --   --   --   --   --   ALBUMIN 2.5* 2.4* 2.1* 2.0*   < > 1.6* 1.7* 2.0* 2.0*   < > = values in this interval not displayed.    Assessment and Plan: Perihepatic and perisplenic abscesses s/p drain placements 10/18 Patient remains clinically unchanged; WBC stable at 13-14, SCr 3.5, afebrile. Stable but with apparent poor prognosis.  Continues current level of care-- antibiotics, drain care, wound care.  Primary team attempting to contact daughter for further GOC.  IR following.  Electronically Signed: Docia Barrier, PA 08/03/2018, 3:13 PM   I spent a total of 15 Minutes at the the patient's bedside AND on the patient's hospital floor or unit, greater than 50% of which was counseling/coordinating care for perihepatic abscess, perisplenic abscess.

## 2018-08-03 NOTE — Progress Notes (Signed)
PROGRESS NOTE  Jerry Green  KYH:[id number] DOB: Jun 16, 1956 DOA: 07/08/2018 PCP: Ollen Bowl, MD   Brief Narrative: Jerry Green is a 62 y.o. male with a history of T2DM, HTN, and GERD who presented initially to [facility] in [place] with progressive RLQ abdominal pain, weight loss found to have a perforated obstructing splenic flexure mass with evidence of lymphatic and liver metastases and peritoneal carcinomatosis. Due to no GI availability, he was transferred to [facility], arriving 10/8 and taken for exploratory laparotomy, hemicolectomy and liver biopsy 10/9, open abdominal wound with vac. He was kept in the ICU, intubated on pressors with evidence of AKI. TNA started 10/15. Pathology returned with adenocarcinoma of the colon and liver biopsy also positive. Oncology was consulted and recommended initiating chemotherapy in the outpatient setting pending clinical course. Unfortunately, the postoperative course was complicated by encephalopathy, respiratory failure, peritonitis with multifocal abscesses for which drains were placed 10/18, and acute renal failure. Nephrology was consulted but did not recommend initiation of dialysis due to stage IV colon cancer. Supportive measures were continued including prolonged antibiotics/antifungal per ID, ventilatory support/reintubation 10/24 due to decreased mental status, bicarbonate . CT of the chest also demonstrated pulmonary nodules suspected metastases. Palliative care was consulted on 10/24, discussed grim prognosis with his HCPOA and only child, Jerry Green, who requested the patient be DNR but continue aggressive interventions. He required transfusions for anemia and developed fevers but ultimately stabilized enough to be weaned from the vent 10/27 and extubated to BiPAP and ultimately to supplemental oxygen alone. Encephalopathy has not improved and work up for reversible causes has been negative to date. The daughter had requested  comfort measures per report, though both I and palliative care have been unable to confirm that at this time. His respiratory status is stabilizing and renal impairment is not worsening, though remains abnormal.   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  - Continue supplemental oxygen prn  Acute metabolic encephalopathy: Likely also complicated by acute delirium, though is certainly multifactorial, namely due to sepsis, possibly uremia. MRI without acute findings 10/22 nonacute, volume loss in brainstem and cerebellum as well as mild-moderate white matter changes consistent with small vessel disease. B12 405. TSH 2.395. - Delirium precautions.  Sepsis due to peritonitis, intraabdominal abscesses due to perforated colon: s/p left hemicolectomy. Culture data unhelpful. Has not had resolution of leukocytosis. - Continue broad antimicrobials (linezolid, zosyn, eraxis directed by ID. If truly converting to comfort measures only, would stop these. Unable to speak with daughter to confirm, so continuing for right now. - Wound dehiscence noted, no evisceration, care per general surgery. - Drain output declining, care per IR. - Diet advanced based on goals of care  discussions, comfort feeds ok, stopped TPN.  Acute renal  failure:  - Not a HD candidate per nephrology - Monitor UOP, creatinine. - Avoid hypotension, nephrotoxins - With acidosis, would consider bicarb if GOC is to reescalate care. No hyperkalemia.   Hypernatremia: Free water deficit.  - Continue comfort feeds. Based on prior goals of care discussions, will not start IV fluids at this time.  Stage IV adenocarcinoma: CEA 94.4. - Oncology has been following, currently not a candidate for therapy. Pending clinical course, could consider outpatient follow up more near his home.  - Overall goals of care discussed with patient's brother at bedside who is local. The patient's daughter is Media planner, lives in [place], near where the patient lived PTA, and did not answer the phone this morning.   T2DM: HbA1c 7.4%, hyperglycemic here due to stress, infection, steroids.  - Continue resistant scale SSI and lantus. At inpatient goal.  Moderate protein-calorie malnutrition:  - Diet as tolerated including protein supplementation   Acute blood loss anemia on anemia of acute and chronic disease: Hgb stable, required transfusions 10/13 and 10/25 - Monitor. No active bleeding noted.  DVT prophylaxis: Heparin Code Status: DNR Family Communication: Called daughter x2 and went to voicemail after 1 ring. LVM. Will continue efforts and ask RN to page if daughter calls or arrives at bedside. Disposition Plan: Pending clinical course and goals of care.   Consultants:   PCCM  IR  General surgery  Palliative care  Procedures:  07/06/2018: Dr. Donne Hazel.  1.  Exploratory laparotomy 2.  Left colectomy 3.  Biopsy of liver lesion  10/9 ETT >> 10/9 NGT >> 10/9 Foley >> 10/9 R DL IJ CVC >> 10/14 PICC placed 10/15 TNA initiated  Antimicrobials: Zosyn 10/8 >> Linezolid 10/25 >>  Anidulafungin 10/20 >>   Subjective: Poorly responsive, appropriately shakes/nods head but not very interactive.     Objective: Vitals:   08/01/18 2040 08/02/18 0507 08/03/18 0449 08/03/18 1437  BP: (!) 132/91 (!) 135/91 (!) 141/87 135/89  Pulse: 91 92 94 90  Resp: _0 Temp: 98.4 F (36.9 C) 98.3 F (36.8 C) 98.8 F (37.1 C) 98.7 F (37.1 C)  TempSrc: Oral Oral Oral Oral  SpO2: 98% 98% 96% 100%  Weight:      Height:        Intake/Output Summary (Last 24 hours) at 08/03/2018 1620 Last data filed at 08/03/2018 1400 Gross per 24 hour  Intake 2729.64 ml  Output 3865 ml  Net -1135.36 ml   Filed Weights   07/27/18 0410 07/28/18 0400 07/29/18 0357  Weight: (!) 159.2 kg (!) 153.8 kg (!) 153.8 kg   Gen: 62 y.o. male in no distress Pulm: Nonlabored. Slight bibasilar crackles. CV: Regular rate and rhythm. No murmur, rub, or gallop. No JVD, no significant dependent edema. GI: Abdomen tender, distended with open midline wound, RUQ and LUQ drains in place. +BS. Ostomy appears healthy in right abdomen. Ext: Warm, no deformities Skin: No other rashes, lesions or ulcers on visualized skin.  Neuro: Drowsy but rousable, not oriented, not cooperative with exam but did move all extremities.  Psych: Judgement and insight impaired/poor.  Data Reviewed: I have personally reviewed following labs and imaging studies  CBC: Recent Labs  Lab 07/28/18 0402 07/29/18 0347 08/01/18 0433 08/02/18 0434 08/03/18 0421  WBC 14.6* 11.6* 13.6* 14.0* 13.0*  NEUTROABS 11.4* 9.4* 10.6* 10.5* 10.1*  HGB 8.1* 7.7* 8.0* 9.3* 9.7*  HCT 25.4* 24.3* 26.1* 29.5* 32.7*  MCV 90.1 90.7 91.3 90.5 92.9  PLT 391 411* 520*  540* 142*   Basic Metabolic Panel: Recent Labs  Lab 07/28/18 0402 07/29/18 0347 07/30/18 0418 07/31/18 0632 08/01/18 0433 08/02/18 0434 08/03/18 0421  NA 143 145  142 143 145 147* 150* 154*  K 3.3* 3.5  3.3* 3.5 4.3 3.2* 3.7 3.8  CL 110 113*  111 115* 116* 115* 126* 128*  CO2 22 20*  19* 18* 19* 15* 14* 16*  GLUCOSE 124* 214*  208* 328* 252* 242* 127* 127*  BUN 99* 101*  98* 100*  112* 119* 123* 104*  CREATININE 4.70* 4.16*  4.10* 3.92* 3.41* 3.43* 3.56* 3.58*  CALCIUM 8.2* 8.6*  8.3* 8.9 9.3 8.8* 9.5 9.4  MG 1.9 2.0  --  2.4  --  2.3  --   PHOS 5.7* 4.4  4.4 2.4* 3.9 3.0 5.0* 5.7*   GFR: Estimated Creatinine Clearance: 33.1 mL/min (A) (by C-G formula based on SCr of 3.58 mg/dL (H)). Liver Function Tests: Recent Labs  Lab 07/30/18 0418 07/31/18 3953 08/01/18 0433 08/02/18 0434 08/03/18 0421  ALBUMIN 1.7* 1.6* 1.7* 2.0* 2.0*   No results for input(s): LIPASE, AMYLASE in the last 168 hours. No results for input(s): AMMONIA in the last 168 hours. Coagulation Profile: No results for input(s): INR, PROTIME in the last 168 hours. Cardiac Enzymes: No results for input(s): CKTOTAL, CKMB, CKMBINDEX, TROPONINI in the last 168 hours. BNP (last 3 results) No results for input(s): PROBNP in the last 8760 hours. HbA1C: No results for input(s): HGBA1C in the last 72 hours. CBG: Recent Labs  Lab 08/02/18 2115 08/03/18 0123 08/03/18 0430 08/03/18 0819 08/03/18 1249  GLUCAP 143* 160* 110* 139* 170*   Lipid Profile: No results for input(s): CHOL, HDL, LDLCALC, TRIG, CHOLHDL, LDLDIRECT in the last 72 hours. Thyroid Function Tests: Recent Labs    08/03/18 0421  TSH 2.395   Anemia Panel: No results for input(s): VITAMINB12, FOLATE, FERRITIN, TIBC, IRON, RETICCTPCT in the last 72 hours. Urine analysis:    Component Value Date/Time   COLORURINE YELLOW 07/26/2018 1547   APPEARANCEUR CLOUDY (A) 07/26/2018 1547   LABSPEC 1.017 07/26/2018 1547   PHURINE 5.0 07/26/2018 1547   GLUCOSEU NEGATIVE 07/26/2018 1547   HGBUR LARGE (A) 07/26/2018 1547   BILIRUBINUR NEGATIVE 07/26/2018 1547   KETONESUR NEGATIVE 07/26/2018 1547   PROTEINUR 30 (A) 07/26/2018 1547   NITRITE NEGATIVE 07/26/2018 1547   LEUKOCYTESUR TRACE (A) 07/26/2018 1547   Recent Results (from the past 240 hour(s))  Culture, blood (Routine X 2) w Reflex to ID Panel     Status: None   Collection  Time: 07/26/18  3:23 PM  Result Value Ref Range Status   Specimen Description BLOOD LEFT HAND  Final   Special Requests   Final    BOTTLES DRAWN AEROBIC ONLY Blood Culture adequate volume   Culture   Final    NO GROWTH 5 DAYS Performed at [facility], [place] [address] [place], [place] [address]    Report Status 07/31/2018 FINAL  Final  Culture, blood (Routine X 2) w Reflex to ID Panel     Status: None   Collection Time: 07/26/18  3:29 PM  Result Value Ref Range Status   Specimen Description BLOOD LEFT HAND  Final   Special Requests   Final    BOTTLES DRAWN AEROBIC ONLY Blood Culture adequate volume   Culture   Final    NO GROWTH 5 DAYS Performed at [facility], [place] [address], [place], [place] [address]  Report Status 07/31/2018 FINAL  Final  Culture, Urine     Status: None   Collection Time: 07/26/18  3:47 PM  Result Value Ref Range Status   Specimen Description URINE, CATHETERIZED  Final   Special Requests NONE  Final   Culture   Final    NO GROWTH Performed at [facility], [address] [place], [place] [address]    Report Status 07/27/2018 FINAL  Final  Culture, respiratory (non-expectorated)     Status: None   Collection Time: 07/26/18  6:46 PM  Result Value Ref Range Status   Specimen Description TRACHEAL ASPIRATE  Final   Special Requests NONE  Final   Gram Stain   Final    FEW WBC PRESENT, PREDOMINANTLY PMN NO ORGANISMS SEEN    Culture   Final    RARE Consistent with normal respiratory flora. Performed at [facility], [place] [address] [place], [place] [address]    Report Status 07/29/2018 FINAL  Final      Radiology Studies: No results found.  Scheduled Meds: . chlorhexidine gluconate (MEDLINE KIT)  15 mL Mouth Rinse BID  . feeding supplement  1 Container Oral TID BM  . heparin injection (subcutaneous)  5,000 Units Subcutaneous Q8H  . insulin aspart  0-20 Units Subcutaneous Q4H  . insulin glargine  25 Units Subcutaneous  Daily  . multivitamin with minerals  1 tablet Oral Daily   Continuous Infusions: . sodium chloride Stopped (08/02/18 1753)  . anidulafungin Stopped (08/02/18 1646)  . linezolid (ZYVOX) IV 600 mg (08/03/18 0944)  . piperacillin-tazobactam (ZOSYN)  IV 3.375 g (08/03/18 0942)     LOS: 24 days   Time spent: 35 minutes.  Patrecia Pour, MD [facility] [email or web address] Password [email or web address] 08/03/2018, 4:20 PM

## 2018-08-03 NOTE — Progress Notes (Signed)
Physical Therapy Treatment Patient Details Name: Jerry Green MRN: [id number] DOB: 1955/12/04 Today's Date: 08/03/2018    History of Present Illness  Jerry Green is a 62 y.o. male with medical history significant of HTN, DM type 2, and Gerd; who initially presented to [facility] with complaints of progressively worsening right lower quadrant abdominal pain over the last 3-4 days. 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  Taken to OR 10/9 for ex lap and liver biopsy s/p colectomy and open abd with wound vac.    PT Comments    Patient lethargic and sleepy today. Able to open eyes and respond to some questions appropriately during session. Pt with impaired attention. Performed bed mobility and rolling today to assist with bath and repositioning. Did not attempt OOB due to decreased level of arousal and safety concerns. Recommend maxi move for transfer to chair with nursing. Nurse got a hold of pt's daughter to determine POC and PLOF. Not sure pt is appropriate for intensity of CIR at this time. Awaiting decision on direction of care after discussion with daughter. Will follow and progress as tolerated.    Follow Up Recommendations  CIR     Equipment Recommendations  None recommended by PT    Recommendations for Other Services       Precautions / Restrictions Precautions Precautions: Fall Precaution Comments: wound vac and jp at abdomen Restrictions Weight Bearing Restrictions: No    Mobility  Bed Mobility Overal bed mobility: Needs Assistance Bed Mobility: Rolling Rolling: Max assist;+2 for physical assistance         General bed mobility comments: maximal/repetitive cues for direction and sequencing for rolling; minimal  initiation of movement. Rolling to right/left x2 to assist with bath and for positioning.  Transfers                    Ambulation/Gait                 Stairs             Wheelchair Mobility    Modified Rankin (Stroke Patients Only)       Balance                                            Cognition Arousal/Alertness: Lethargic Behavior During Therapy: Flat affect Overall Cognitive Status: Difficult to assess Area of Impairment: Attention;Following commands;Problem solving                   Current Attention Level: Sustained;Focused   Following Commands: Follows one step commands with increased time     Problem Solving: Slow processing;Decreased initiation;Requires verbal cues;Requires tactile cues;Difficulty sequencing General Comments: Coherent at times answering some questions appropriately but other times, falling asleep with poor attention. Able to state name and [place] but does not know date.       Exercises General Exercises - Lower Extremity Ankle Circles/Pumps: AAROM;Both;10 reps;Supine    General Comments        Pertinent Vitals/Pain Pain Assessment: Faces Faces Pain Scale: Hurts a little bit Pain Location: surgical site Pain Descriptors / Indicators: Grimacing Pain Intervention(s): Monitored during session  Home Living                      Prior Function            PT Goals (current goals can now be found in the care plan section) Progress towards PT goals: Not progressing toward goals - comment(secondary to decreased arousal)    Frequency    Min 3X/week      PT Plan Frequency needs to be updated    Co-evaluation              AM-PAC PT "6 Clicks" Daily Activity  Outcome Measure  Difficulty turning over in bed (including adjusting bedclothes, sheets and blankets)?: Unable Difficulty moving from lying on back to sitting on the side of the bed? : Unable Difficulty  sitting down on and standing up from a chair with arms (e.g., wheelchair, bedside commode, etc,.)?: Unable Help needed moving to and from a bed to chair (including a wheelchair)?: A Lot Help needed walking in hospital room?: Total Help needed climbing 3-5 steps with a railing? : Total 6 Click Score: 7    End of Session   Activity Tolerance: Patient limited by lethargy Patient left: in bed;with call bell/phone within reach;with nursing/sitter in room Nurse Communication: Mobility status;Need for lift equipment PT Visit Diagnosis: Unsteadiness on feet (R26.81);Other abnormalities of gait and mobility (R26.89);Difficulty in walking, not elsewhere classified (R26.2)     Time: 8138-8719 PT Time Calculation (min) (ACUTE ONLY): 24 min  Charges:  $Therapeutic Activity: 23-37 mins                     Jerry Green, [place], DPT Acute Rehabilitation Services Pager [phone] Office [phone]       Jerry Green 08/03/2018, 12:38 PM

## 2018-08-03 NOTE — Progress Notes (Addendum)
[facility] Progress Note  23 Days Post-Op  Subjective: CC:  Pt resting comfortably. Arouses to loud voice and has clear speech but is not answering questions appropriately.  Denies pain.   Objective: Vital signs in last 24 hours: Temp:  [98.8 F (37.1 C)] 98.8 F (37.1 C) (11/01 0449) Pulse Rate:  [94] 94 (11/01 0449) Resp:  [19] 19 (11/01 0449) BP: (141)/(87) 141/87 (11/01 0449) SpO2:  [96 %] 96 % (11/01 0449) Last BM Date: 08/02/18  Intake/Output from previous day: 10/31 0701 - 11/01 0700 In: 2546.6 [P.O.:697; I.V.:91.8; IV Piggyback:1757.9] Out: 3515 [Urine:3250; Drains:15; Stool:250] Intake/Output this shift: No intake/output data recorded.  PE: Gen:  Alert, NAD Card:  Regular rate and rhythm Pulm:  Normal effort Abd: Soft, appropriately tender, midline wound dehiscence with granulation tissue around wound perimeter, visibile fascial sutures, mepitel in wound base, RUQ drain with small amt sanguinous drainage, LUQ drain with small amount of purulent drainage. GU - foley in place  Skin: warm and dry, no rashes  Psych: alert, not oriented to place/time  Lab Results:  Recent Labs    08/02/18 0434 08/03/18 0421  WBC 14.0* 13.0*  HGB 9.3* 9.7*  HCT 29.5* 32.7*  PLT 540* 604*   BMET Recent Labs    08/02/18 0434 08/03/18 0421  NA 150* 154*  K 3.7 3.8  CL 126* 128*  CO2 14* 16*  GLUCOSE 127* 127*  BUN 123* 104*  CREATININE 3.56* 3.58*  CALCIUM 9.5 9.4   PT/INR No results for input(s): LABPROT, INR in the last 72 hours. CMP     Component Value Date/Time   NA 154 (H) 08/03/2018 0421   K 3.8 08/03/2018 0421   CL 128 (H) 08/03/2018 0421   CO2 16 (L) 08/03/2018 0421   GLUCOSE 127 (H) 08/03/2018 0421   BUN 104 (H) 08/03/2018 0421   CREATININE 3.58 (H) 08/03/2018 0421   CALCIUM 9.4 08/03/2018 0421   PROT 8.9 (H) 07/25/2018 0500   ALBUMIN 2.0 (L) 08/03/2018 0421   AST 138 (H) 07/25/2018 0500   ALT 105 (H) 07/25/2018 0500   ALKPHOS 119  07/25/2018 0500   BILITOT 1.0 07/25/2018 0500   GFRNONAA 17 (L) 08/03/2018 0421   GFRAA 19 (L) 08/03/2018 0421   Lipase  No results found for: LIPASE     Studies/Results: No results found.  Anti-infectives: Anti-infectives (From admission, onward)   Start     Dose/Rate Route Frequency Ordered Stop   07/27/18 1100  linezolid (ZYVOX) IVPB 600 mg     600 mg 300 mL/hr over 60 Minutes Intravenous Every 12 hours 07/27/18 1031     07/23/18 1500  anidulafungin (ERAXIS) 100 mg in sodium chloride 0.9 % 100 mL IVPB     100 mg 78 mL/hr over 100 Minutes Intravenous Every 24 hours 07/22/18 1420     07/22/18 2000  DAPTOmycin (CUBICIN) 850 mg in sodium chloride 0.9 % IVPB  Status:  Discontinued     850 mg 234 mL/hr over 30 Minutes Intravenous Every 48 hours 07/22/18 1459 07/27/18 1031   07/22/18 1530  anidulafungin (ERAXIS) 200 mg in sodium chloride 0.9 % 200 mL IVPB     200 mg 78 mL/hr over 200 Minutes Intravenous  Once 07/22/18 1420 07/22/18 1941   07/15/2018 1800  piperacillin-tazobactam (ZOSYN) IVPB 3.375 g     3.375 g 12.5 mL/hr over 240 Minutes Intravenous Every 8 hours 07/03/2018 1444     07/26/2018 0915  cefoTEtan (CEFOTAN) 2 g in sodium chloride  0.9 % 100 mL IVPB     2 g 200 mL/hr over 30 Minutes Intravenous To Short Stay 07/04/2018 0857 07/10/2018 1900   07/20/2018 0906  cefoTEtan in Dextrose 5% (CEFOTAN) 2-2.08 GM-%(50ML) IVPB    Note to Pharmacy:  [name]   : cabinet override      07/26/2018 0906 07/07/2018 2114   07/29/2018 1200  piperacillin-tazobactam (ZOSYN) IVPB 3.375 g  Status:  Discontinued     3.375 g 12.5 mL/hr over 240 Minutes Intravenous Every 8 hours 07/18/2018 0615 07/17/2018 1341   07/21/2018 0730  cefoTEtan (CEFOTAN) 2 g in sodium chloride 0.9 % 100 mL IVPB  Status:  Discontinued     2 g 200 mL/hr over 30 Minutes Intravenous On call to O.R. 07/04/2018 0723 07/20/2018 0559   07/14/2018 0615  piperacillin-tazobactam (ZOSYN) IVPB 3.375 g     3.375 g 100 mL/hr over 30 Minutes Intravenous   Once 07/12/2018 0612 07/12/2018 1943       Assessment/Plan s/pProcedure(s): EXPLORATORY LAPAROTOMY (N/A) PARTIAL COLON RESECTION WITH COLOSTOMY (N/A) LIVER BIOPSY (N/A) APPLICATION OF WOUND VAC (N/A) Acute respiratory failure - on Vent - extubated 10/13, 10/27 Acute renal failure  Hypertension  Diabetes  GERD  Remote tobacco use  BMI 36  Anemia - transfused  Hypokalemia - resolved  Hypernatremia  Hyperchloremia  Malnutrition - moderate - prealbumin 12.2  Hypernatremia -151  Post op ileus, improving  Confusion  Leukocytosis  PNA  Perforated splenic flexure colon cancer with large and small bowel obstruction  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 Rolm Bookbinder POD#21 Abdominal wound dehiscence--no evisceration. Does not need any reoperative surgery.  - continue dressing changes with mepitel in wound base. - IR abdominal drain placement x 2 - 07/20/18 - minimal output, CX no growth - patient on comfort measures and awaiting further discussion with POA regarding pursuing hospice care.  - continue comfort feeds/sips of liquids  FEN: comfort feeds ID: Zosyn 10/8 -->; Cubacin 10/20 -->eraxis 10/21 --> DVT: SCD/subcutaneous heparin  Foley: present  Follow up: Dr. Gertie Gowda improves   LOS: 24 days    Obie Dredge, [facility] Surgery Pager: [phone]

## 2018-08-03 DEATH — deceased

## 2018-08-04 LAB — GLUCOSE, CAPILLARY
GLUCOSE-CAPILLARY: 153 mg/dL — AB (ref 70–99)
Glucose-Capillary: 113 mg/dL — ABNORMAL HIGH (ref 70–99)
Glucose-Capillary: 132 mg/dL — ABNORMAL HIGH (ref 70–99)
Glucose-Capillary: 138 mg/dL — ABNORMAL HIGH (ref 70–99)
Glucose-Capillary: 186 mg/dL — ABNORMAL HIGH (ref 70–99)
Glucose-Capillary: 196 mg/dL — ABNORMAL HIGH (ref 70–99)

## 2018-08-04 LAB — RENAL FUNCTION PANEL
Albumin: 2.3 g/dL — ABNORMAL LOW (ref 3.5–5.0)
BUN: 95 mg/dL — ABNORMAL HIGH (ref 8–23)
CO2: 17 mmol/L — ABNORMAL LOW (ref 22–32)
CREATININE: 3.64 mg/dL — AB (ref 0.61–1.24)
Calcium: 9.8 mg/dL (ref 8.9–10.3)
GFR, EST AFRICAN AMERICAN: 19 mL/min — AB (ref >60)
GFR, EST NON AFRICAN AMERICAN: 16 mL/min — AB (ref >60)
Glucose, Bld: 172 mg/dL — ABNORMAL HIGH (ref 70–99)
Phosphorus: 5.7 mg/dL — ABNORMAL HIGH (ref 2.5–4.6)
Potassium: 3.8 mmol/L (ref 3.5–5.1)
Sodium: 157 mmol/L — ABNORMAL HIGH (ref 135–145)

## 2018-08-04 LAB — CBC WITH DIFFERENTIAL/PLATELET
Abs Immature Granulocytes: 0.17 10*3/uL — ABNORMAL HIGH (ref 0.00–0.07)
BASOS ABS: 0.1 10*3/uL (ref 0.0–0.1)
Basophils Relative: 0 %
EOS PCT: 2 %
Eosinophils Absolute: 0.3 10*3/uL (ref 0.0–0.5)
HCT: 36.2 % — ABNORMAL LOW (ref 39.0–52.0)
Hemoglobin: 11.1 g/dL — ABNORMAL LOW (ref 13.0–17.0)
Immature Granulocytes: 1 %
LYMPHS PCT: 11 %
Lymphs Abs: 1.7 10*3/uL (ref 0.7–4.0)
MCH: 28.5 pg (ref 26.0–34.0)
MCHC: 30.7 g/dL (ref 30.0–36.0)
MCV: 92.8 fL (ref 80.0–100.0)
Monocytes Absolute: 1 10*3/uL (ref 0.1–1.0)
Monocytes Relative: 6 %
NRBC: 0.1 % (ref 0.0–0.2)
Neutro Abs: 13.2 10*3/uL — ABNORMAL HIGH (ref 1.7–7.7)
Neutrophils Relative %: 80 %
PLATELETS: 598 10*3/uL — AB (ref 150–400)
RBC: 3.9 MIL/uL — AB (ref 4.22–5.81)
RDW: 16.2 % — ABNORMAL HIGH (ref 11.5–15.5)
WBC: 16.4 10*3/uL — AB (ref 4.0–10.5)

## 2018-08-04 NOTE — Progress Notes (Signed)
I have received call from [place] in the Lab.  He informs me that patient has a Chloride level of 130 or above.  I have Text messaged provider on call for [place] and made provider aware of the results.

## 2018-08-04 NOTE — Progress Notes (Signed)
Patient ID: Jerry Green, male   DOB: 05-Aug-1956, 62 y.o.   MRN: [id number]  [facility] Progress Note:   24 Days Post-Op  Subjective: Mental status is sluggish;   Objective: Vital signs in last 24 hours: Temp:  [97.6 F (36.4 C)-98.7 F (37.1 C)] 97.6 F (36.4 C) (11/02 0430) Pulse Rate:  [90-104] 97 (11/02 0430) Resp:  [18-19] 19 (11/02 0430) BP: (135-148)/(89-104) 145/97 (11/02 0430) SpO2:  [100 %] 100 % (11/02 0430)  Intake/Output from previous day: 11/01 0701 - 11/02 0700 In: 850.1 [P.O.:480; IV Piggyback:350.1] Out: 3465 [Urine:2805; Drains:65; Stool:595] Intake/Output this shift: No intake/output data recorded.  Physical Exam: Work of breathing is not labored.Ostomy in right upper quadrant functioning.  Drain in left upper quadrant with yellowish drainage.  Wound with fascial dehiscence but clean  Lab Results:  Results for orders placed or performed during the hospital encounter of 07/29/2018 (from the past 48 hour(s))  Glucose, capillary     Status: Abnormal   Collection Time: 08/02/18  8:45 AM  Result Value Ref Range   Glucose-Capillary 167 (H) 70 - 99 mg/dL  Glucose, capillary     Status: Abnormal   Collection Time: 08/02/18 12:22 PM  Result Value Ref Range   Glucose-Capillary 228 (H) 70 - 99 mg/dL  Glucose, capillary     Status: Abnormal   Collection Time: 08/02/18  5:55 PM  Result Value Ref Range   Glucose-Capillary 114 (H) 70 - 99 mg/dL  Glucose, capillary     Status: Abnormal   Collection Time: 08/02/18  9:15 PM  Result Value Ref Range   Glucose-Capillary 143 (H) 70 - 99 mg/dL  Glucose, capillary     Status: Abnormal   Collection Time: 08/03/18  1:23 AM  Result Value Ref Range   Glucose-Capillary 160 (H) 70 - 99 mg/dL  Renal function panel     Status: Abnormal   Collection Time: 08/03/18  4:21 AM  Result Value Ref Range   Sodium 154 (H) 135 - 145 mmol/L   Potassium 3.8 3.5 - 5.1 mmol/L   Chloride 128 (H) 98 - 111 mmol/L   CO2 16 (L) 22 - 32  mmol/L   Glucose, Bld 127 (H) 70 - 99 mg/dL   BUN 104 (H) 8 - 23 mg/dL   Creatinine, Ser 3.58 (H) 0.61 - 1.24 mg/dL   Calcium 9.4 8.9 - 10.3 mg/dL   Phosphorus 5.7 (H) 2.5 - 4.6 mg/dL   Albumin 2.0 (L) 3.5 - 5.0 g/dL   GFR calc non Af Amer 17 (L) >60 mL/min   GFR calc Af Amer 19 (L) >60 mL/min    Comment: (NOTE) The eGFR has been calculated using the CKD EPI equation. This calculation has not been validated in all clinical situations. eGFR's persistently <60 mL/min signify possible Chronic Kidney Disease.    Anion gap 10 5 - 15    Comment: Performed at [facility] [address], [place], [place] [address]  CBC with Differential/Platelet     Status: Abnormal   Collection Time: 08/03/18  4:21 AM  Result Value Ref Range   WBC 13.0 (H) 4.0 - 10.5 K/uL   RBC 3.52 (L) 4.22 - 5.81 MIL/uL   Hemoglobin 9.7 (L) 13.0 - 17.0 g/dL   HCT 32.7 (L) 39.0 - 52.0 %   MCV 92.9 80.0 - 100.0 fL   MCH 27.6 26.0 - 34.0 pg   MCHC 29.7 (L) 30.0 - 36.0 g/dL   RDW 15.5 11.5 - 15.5 %  Platelets 604 (H) 150 - 400 K/uL   nRBC 0.3 (H) 0.0 - 0.2 %   Neutrophils Relative % 78 %   Neutro Abs 10.1 (H) 1.7 - 7.7 K/uL   Lymphocytes Relative 11 %   Lymphs Abs 1.4 0.7 - 4.0 K/uL   Monocytes Relative 8 %   Monocytes Absolute 1.1 (H) 0.1 - 1.0 K/uL   Eosinophils Relative 2 %   Eosinophils Absolute 0.2 0.0 - 0.5 K/uL   Basophils Relative 0 %   Basophils Absolute 0.0 0.0 - 0.1 K/uL   Immature Granulocytes 1 %   Abs Immature Granulocytes 0.15 (H) 0.00 - 0.07 K/uL    Comment: Performed at [facility] [address], [place], [place] [address]  TSH     Status: None   Collection Time: 08/03/18  4:21 AM  Result Value Ref Range   TSH 2.395 0.350 - 4.500 uIU/mL    Comment: Performed by a 3rd Generation assay with a functional sensitivity of <=0.01 uIU/mL. Performed at [facility], [place] [address], [place], [place] [address]   Glucose, capillary     Status: Abnormal   Collection Time: 08/03/18   4:30 AM  Result Value Ref Range   Glucose-Capillary 110 (H) 70 - 99 mg/dL  Glucose, capillary     Status: Abnormal   Collection Time: 08/03/18  8:19 AM  Result Value Ref Range   Glucose-Capillary 139 (H) 70 - 99 mg/dL   Comment 1 Notify RN   Glucose, capillary     Status: Abnormal   Collection Time: 08/03/18 12:49 PM  Result Value Ref Range   Glucose-Capillary 170 (H) 70 - 99 mg/dL   Comment 1 Notify RN   Glucose, capillary     Status: Abnormal   Collection Time: 08/03/18  5:02 PM  Result Value Ref Range   Glucose-Capillary 125 (H) 70 - 99 mg/dL  Glucose, capillary     Status: Abnormal   Collection Time: 08/03/18  8:22 PM  Result Value Ref Range   Glucose-Capillary 131 (H) 70 - 99 mg/dL  Glucose, capillary     Status: Abnormal   Collection Time: 08/04/18 12:21 AM  Result Value Ref Range   Glucose-Capillary 196 (H) 70 - 99 mg/dL  Glucose, capillary     Status: Abnormal   Collection Time: 08/04/18  4:27 AM  Result Value Ref Range   Glucose-Capillary 153 (H) 70 - 99 mg/dL  Renal function panel     Status: Abnormal   Collection Time: 08/04/18  5:04 AM  Result Value Ref Range   Sodium 157 (H) 135 - 145 mmol/L   Potassium 3.8 3.5 - 5.1 mmol/L   Chloride >130 (HH) 98 - 111 mmol/L    Comment: CRITICAL RESULT CALLED TO, READ BACK BY AND VERIFIED WITH: MCLAURIN,F RN 08/04/2018 0644 JORDANS    CO2 17 (L) 22 - 32 mmol/L   Glucose, Bld 172 (H) 70 - 99 mg/dL   BUN 95 (H) 8 - 23 mg/dL   Creatinine, Ser 3.64 (H) 0.61 - 1.24 mg/dL   Calcium 9.8 8.9 - 10.3 mg/dL   Phosphorus 5.7 (H) 2.5 - 4.6 mg/dL   Albumin 2.3 (L) 3.5 - 5.0 g/dL   GFR calc non Af Amer 16 (L) >60 mL/min   GFR calc Af Amer 19 (L) >60 mL/min    Comment: (NOTE) The eGFR has been calculated using the CKD EPI equation. This calculation has not been validated in all clinical situations. eGFR's persistently <60  mL/min signify possible Chronic Kidney Disease. CORRECTED ON 11/02 AT 0648: PREVIOUSLY REPORTED AS 19     Anion gap NOT CALCULATED 5 - 15    Comment: Performed at [facility] [address] [place], [place] [address]  CBC with Differential/Platelet     Status: Abnormal   Collection Time: 08/04/18  5:04 AM  Result Value Ref Range   WBC 16.4 (H) 4.0 - 10.5 K/uL   RBC 3.90 (L) 4.22 - 5.81 MIL/uL   Hemoglobin 11.1 (L) 13.0 - 17.0 g/dL   HCT 36.2 (L) 39.0 - 52.0 %   MCV 92.8 80.0 - 100.0 fL   MCH 28.5 26.0 - 34.0 pg   MCHC 30.7 30.0 - 36.0 g/dL   RDW 16.2 (H) 11.5 - 15.5 %   Platelets 598 (H) 150 - 400 K/uL   nRBC 0.1 0.0 - 0.2 %   Neutrophils Relative % 80 %   Neutro Abs 13.2 (H) 1.7 - 7.7 K/uL   Lymphocytes Relative 11 %   Lymphs Abs 1.7 0.7 - 4.0 K/uL   Monocytes Relative 6 %   Monocytes Absolute 1.0 0.1 - 1.0 K/uL   Eosinophils Relative 2 %   Eosinophils Absolute 0.3 0.0 - 0.5 K/uL   Basophils Relative 0 %   Basophils Absolute 0.1 0.0 - 0.1 K/uL   Immature Granulocytes 1 %   Abs Immature Granulocytes 0.17 (H) 0.00 - 0.07 K/uL    Comment: Performed at [facility] [address] [place], [place] [address]    Radiology/Results: No results found.  Anti-infectives: Anti-infectives (From admission, onward)   Start     Dose/Rate Route Frequency Ordered Stop   07/27/18 1100  linezolid (ZYVOX) IVPB 600 mg     600 mg 300 mL/hr over 60 Minutes Intravenous Every 12 hours 07/27/18 1031     07/23/18 1500  anidulafungin (ERAXIS) 100 mg in sodium chloride 0.9 % 100 mL IVPB     100 mg 78 mL/hr over 100 Minutes Intravenous Every 24 hours 07/22/18 1420     07/22/18 2000  DAPTOmycin (CUBICIN) 850 mg in sodium chloride 0.9 % IVPB  Status:  Discontinued     850 mg 234 mL/hr over 30 Minutes Intravenous Every 48 hours 07/22/18 1459 07/27/18 1031   07/22/18 1530  anidulafungin (ERAXIS) 200 mg in sodium chloride 0.9 % 200 mL IVPB     200 mg 78 mL/hr over 200 Minutes Intravenous  Once 07/22/18 1420 07/22/18 1941   07/28/2018 1800  piperacillin-tazobactam (ZOSYN) IVPB 3.375 g      3.375 g 12.5 mL/hr over 240 Minutes Intravenous Every 8 hours 07/16/2018 1444     07/03/2018 0915  cefoTEtan (CEFOTAN) 2 g in sodium chloride 0.9 % 100 mL IVPB     2 g 200 mL/hr over 30 Minutes Intravenous To Short Stay 07/12/2018 0857 07/29/2018 1900   07/28/2018 0906  cefoTEtan in Dextrose 5% (CEFOTAN) 2-2.08 GM-%(50ML) IVPB    Note to Pharmacy:  [name]   : cabinet override      07/21/2018 0906 07/14/2018 2114   08/02/2018 1200  piperacillin-tazobactam (ZOSYN) IVPB 3.375 g  Status:  Discontinued     3.375 g 12.5 mL/hr over 240 Minutes Intravenous Every 8 hours 07/30/2018 0615 07/17/2018 1341   07/23/2018 0730  cefoTEtan (CEFOTAN) 2 g in sodium chloride 0.9 % 100 mL IVPB  Status:  Discontinued     2 g 200 mL/hr over 30 Minutes Intravenous On call to O.R. 07/22/2018 5498  07/06/2018 0559   07/22/2018 0615  piperacillin-tazobactam (ZOSYN) IVPB 3.375 g     3.375 g 100 mL/hr over 30 Minutes Intravenous  Once 07/08/2018 0612 08/02/2018 1943      Assessment/Plan: Problem List: Patient Active Problem List   Diagnosis Date Noted  . Malignant neoplasm of colon ([place])   . Liver metastases ([place])   . Palliative care by specialist   . Metastatic adenocarcinoma ([place])   . Metastatic cancer ([place])   . DNR (do not resuscitate)   . Terminal care   . Intraabdominal fluid collection   . Fever   . Acute respiratory failure with hypoxia ([place])   . Perforated colon cancer of splenic flexure s/p colectomy/colostomy 08/01/2018 07/19/2018  . 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 Ileus, postoperative ([place]) 07/19/2018  . 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 Cancer of splenic flexure of colon pT4b, pN1b M1   . Bowel obstruction ([place]) 07/27/2018  . Peritoneal carcinomatosis ([place]) 07/18/2018  . Essential hypertension 07/29/2018  . GERD (gastroesophageal reflux disease) 07/13/2018  . Leukocytosis 07/04/2018    Significantly decondiitioned man with advanced cancer.  CCS following prn.   24 Days Post-Op    LOS: 25 days    Matt B. Hassell Done, MD, [facility] [phone] beeper [phone]  08/04/2018 8:17 AM

## 2018-08-04 NOTE — Progress Notes (Signed)
PROGRESS NOTE  Jerry Green  ZTI:[id number] DOB: [birth year] DOA: 07/30/2018 PCP: Ollen Bowl, MD   Brief Narrative: Jerry Green is a 62 y.o. male with a history of T2DM, HTN, and GERD who presented initially to [facility] in [place] with progressive RLQ abdominal pain, weight loss found to have a perforated obstructing splenic flexure mass with evidence of lymphatic and liver metastases and peritoneal carcinomatosis. Due to no GI availability, he was transferred to [facility], arriving 10/8 and taken for exploratory laparotomy, hemicolectomy and liver biopsy 10/9, open abdominal wound with vac. He was kept in the ICU, intubated on pressors with evidence of AKI. TNA started 10/15. Pathology returned with adenocarcinoma of the colon and liver biopsy also positive. Oncology was consulted and recommended initiating chemotherapy in the outpatient setting pending clinical course. Unfortunately, the postoperative course was complicated by encephalopathy, respiratory failure, peritonitis with multifocal abscesses for which drains were placed 10/18, and acute renal failure. Nephrology was consulted but did not recommend initiation of dialysis due to stage IV colon cancer. Supportive measures were continued including prolonged antibiotics/antifungal per ID, ventilatory support/reintubation 10/24 due to decreased mental status, bicarbonate . CT of the chest also demonstrated pulmonary nodules suspected metastases. Palliative care was consulted on 10/24, discussed grim prognosis with his HCPOA and only child, Jerry Green, who requested the patient be DNR but continue aggressive interventions. He required transfusions for anemia and developed fevers but ultimately stabilized enough to be weaned from the vent 10/27 and extubated to BiPAP and ultimately to supplemental oxygen alone. Encephalopathy has not improved and work up for reversible causes has been negative to date. The daughter had requested  comfort measures. He is stable for transport to a hospice facility.  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 07/17/2018 Active Problems:   Bowel obstruction (HCC)   Peritoneal carcinomatosis (HCC)   Essential hypertension   GERD (gastroesophageal reflux disease)   Leukocytosis   Cancer of splenic flexure of colon pT4b, pN1b M1   Liver metastasis from colon   Ileus, postoperative (HCC)   Colostomy in place [name])   Acute respiratory failure with hypoxia ([place])   Metastatic cancer ([name])   DNR (do not resuscitate)   Terminal care   Intraabdominal fluid collection   Fever   Liver metastases ([name])   Palliative care by specialist   Metastatic adenocarcinoma ([name])   Malignant neoplasm of colon ([name])  Acute hypoxic respiratory failure: s/p extubation and weaned from BiPAP.  - Continue supplemental oxygen prn dyspnea.   Acute metabolic encephalopathy: Likely also complicated by acute delirium, though is certainly multifactorial, namely due to sepsis, possibly uremia. MRI without acute findings 10/22 nonacute, volume loss in brainstem and cerebellum as well as mild-moderate white matter changes consistent with small vessel disease. B12 405. TSH 2.395. - Delirium precautions.  Sepsis due to peritonitis, intraabdominal abscesses due to perforated colon: s/p left hemicolectomy. Culture data unhelpful. Has not had resolution of leukocytosis. - Will stop antibiotics per [name] discussion 11/2.  - Wound dehiscence noted, no evisceration, care per general surgery. Aim to minimize discomfort. - Drain output declining, care per IR. - Comfort feeds ok (? for surgery > can he take solids if he were to desire that?), stopped TPN. - Dilaudid prn pain, appears comfortable.   Acute renal failure: No further lab draws, use judicious medications as able and avoid nephrotoxins.    Hypernatremia: Free water deficit.  - Continue comfort feeds.  No IVF  desired by [name].  Stage IV adenocarcinoma: CEA 94.4. - Oncology has been following, currently not a candidate for therapy. Very advanced disease makes prognosis poor.  - Goal of therapy is comfort as this is an incurable diagnosis.   T2DM: HbA1c 7.4%, hyperglycemic here due to stress, infection, steroids.  - Continue resistant scale SSI and lantus.  Moderate protein-calorie malnutrition:  - Diet as tolerated including protein supplementation   Acute blood loss anemia on anemia of acute and chronic disease: Hgb stable, required transfusions 10/13 and 10/25. No bleeding. No more lab draws.  DVT prophylaxis: Comfort care Code Status: DNR Family Communication: Daughter and ex-wife Disposition Plan: Residential hospice near family in [place] when bed available. CSW consulted.  Consultants:   PCCM  IR  General surgery  Palliative care  Procedures:  07/24/2018: Dr. Donne Hazel.  1.  Exploratory laparotomy 2.  Left colectomy 3.  Biopsy of liver lesion  10/9 ETT >> 10/9 NGT >> 10/9 Foley >> 10/9 R DL IJ CVC >> 10/14 PICC placed 10/15 TNA initiated  Antimicrobials: Zosyn 10/8 - 11/2 Linezolid 10/25 - 11/2 Anidulafungin 10/20 - 11/2  Subjective: Responds to voice and touch, denies complaints. Daughter and many family members at bedside this PM. Appears comfortable.  Objective: BP (!) 138/103 (BP Location: Left Arm) Comment: nurse notified  Pulse (!) 107   Temp 98.6 F (37 C) (Oral)   Resp 18   Ht 6\' 2"  (1.88 m)   Wt (!) 153.8 kg   SpO2 100%   BMI 43.53 kg/m   Gen: 62 y.o. male in no distress Pulm: Nonlabored breathing room air. Clear. CV: Regular rate and rhythm. No murmur, rub, or gallop. No JVD, no dependent edema. GI: Abdomen soft, mildly tender, + BS. Drains, abd wound stable.   Ext: Warm, no deformities Skin: No new rashes, lesions or ulcers on visualized skin.  Neuro: Rousable, one word answers seem to be appropriate, no focal neurological  deficits. Psych: Judgement and insight appear impaired. Mood euthymic & affect congruent.   Time spent: 35 minutes.  Patrecia Pour, MD [facility] [email or web address] Password [facility] 08/04/2018, 5:34 PM

## 2018-08-05 LAB — GLUCOSE, CAPILLARY
GLUCOSE-CAPILLARY: 142 mg/dL — AB (ref 70–99)
GLUCOSE-CAPILLARY: 142 mg/dL — AB (ref 70–99)
GLUCOSE-CAPILLARY: 114 mg/dL — AB (ref 70–99)
Glucose-Capillary: 156 mg/dL — ABNORMAL HIGH (ref 70–99)
Glucose-Capillary: 193 mg/dL — ABNORMAL HIGH (ref 70–99)

## 2018-08-05 MED ORDER — INSULIN ASPART 100 UNIT/ML ~~LOC~~ SOLN: 0.0000 [IU] | Freq: Four times a day (QID) | SUBCUTANEOUS | Status: DC

## 2018-08-05 MED ORDER — INSULIN ASPART 100 UNIT/ML ~~LOC~~ SOLN
0.0000 [IU] | Freq: Four times a day (QID) | SUBCUTANEOUS | Status: DC
  Administered 2018-08-05 – 2018-08-06 (×2): 4 [IU] via SUBCUTANEOUS

## 2018-08-05 NOTE — Progress Notes (Signed)
PROGRESS NOTE  Jerry Green  GQQ:[id number] DOB: [birth year] DOA: 08/02/2018 PCP: Ollen Bowl, MD   Brief Narrative: Jerry Green is a 62 y.o. male with a history of T2DM, HTN, and GERD who presented initially to [facility] in [place] with progressive RLQ abdominal pain, weight loss found to have a perforated obstructing splenic flexure mass with evidence of lymphatic and liver metastases and peritoneal carcinomatosis. Due to no GI availability, he was transferred to [facility], arriving 10/8 and taken for exploratory laparotomy, hemicolectomy and liver biopsy 10/9, open abdominal wound with vac. He was kept in the ICU, intubated on pressors with evidence of AKI. TNA started 10/15. Pathology returned with adenocarcinoma of the colon and liver biopsy also positive. Oncology was consulted and recommended initiating chemotherapy in the outpatient setting pending clinical course. Unfortunately, the postoperative course was complicated by encephalopathy, respiratory failure, peritonitis with multifocal abscesses for which drains were placed 10/18, and acute renal failure. Nephrology was consulted but did not recommend initiation of dialysis due to stage IV colon cancer. Supportive measures were continued including prolonged antibiotics/antifungal per ID, ventilatory support/reintubation 10/24 due to decreased mental status, bicarbonate . CT of the chest also demonstrated pulmonary nodules suspected metastases. Palliative care was consulted on 10/24, discussed grim prognosis with his HCPOA and only child, Jerry Green, who requested the patient be DNR but continue aggressive interventions. He required transfusions for anemia and developed fevers but ultimately stabilized enough to be weaned from the vent 10/27 and extubated to BiPAP and ultimately to supplemental oxygen alone. Encephalopathy has not improved and work up for reversible causes has been negative to date. The daughter had requested  comfort measures. He is stable for transport to a hospice facility.  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  - Continue supplemental oxygen prn dyspnea.   Acute metabolic encephalopathy: Likely also complicated by acute delirium, though is certainly multifactorial, namely due to sepsis, possibly uremia. MRI without acute findings 10/22 nonacute, volume loss in brainstem and cerebellum as well as mild-moderate white matter changes consistent with small vessel disease. B12 405. TSH 2.395. - Delirium precautions.  Sepsis due to peritonitis, intraabdominal abscesses due to perforated colon: s/p left hemicolectomy. Culture data unhelpful. Has not had resolution of leukocytosis. - Stopped antibiotics per [place] discussion 11/2.  - Wound dehiscence noted, no evisceration, care per general surgery. Aim to minimize discomfort. - Drain output declining, care per IR. D/w them that pt is comfort measures. - Comfort feeds ok, not desirous of solids, stopped TPN. - Dilaudid prn pain, appears comfortable.   Acute renal failure: No further lab draws, use judicious medications as able and avoid nephrotoxins.    Hypernatremia: Free water deficit.  - Continue comfort feeds. No  IVF desired by HCPOA.  Stage IV adenocarcinoma: CEA 94.4. - Oncology has been following, currently not a candidate for therapy. Very advanced disease makes prognosis poor. Not planning Tx. - Goal of therapy is comfort as this is an incurable diagnosis.   T2DM: HbA1c 7.4%, hyperglycemic here due to stress, infection, steroids.  - Continue resistant scale SSI and lantus.  Moderate protein-calorie malnutrition:  - Diet as tolerated including protein supplementation   Acute blood loss anemia on anemia of acute and chronic disease: Hgb stable, required transfusions 10/13 and 10/25. No bleeding. No more lab draws.  DVT prophylaxis: Comfort care Code Status: DNR Family Communication: Brother at bedside Disposition Plan: CSW consulted for residential hospice placement near [place], [place].  Consultants:   PCCM  IR  General surgery  Palliative care  Procedures:  07/28/2018: Dr. Donne Hazel.  1.  Exploratory laparotomy 2.  Left colectomy 3.  Biopsy of liver lesion  10/9 ETT >> 10/9 NGT >> 10/9 Foley >> 10/9 R DL IJ CVC >> 10/14 PICC placed 10/15 TNA initiated  Antimicrobials: Zosyn 10/8 - 11/2 Linezolid 10/25 - 11/2 Anidulafungin 10/20 - 11/2  Subjective: No complaints, alert, taking liquids by mouth but not much else. Denies being hungry or thirsty to me and his brother at bedside.  Objective: BP (!) 141/103 (BP Location: Left Arm)   Pulse (!) 116   Temp 98.5 F (36.9 C) (Oral)   Resp 20   Ht 6\' 2"  (1.88 m)   Wt (!) 153.8 kg   SpO2 99%   BMI 43.53 kg/m   Gen: 62 y.o. male in no distress Pulm: Nonlabored breathing room air. Clear. CV: Regular rate and rhythm. No murmur, rub, or gallop. No JVD, no dependent edema. GI: Abdomen soft, tender, mildly distended +BS.  Ext: Warm, no deformities Skin: JP drains in RUQ, LUQ stable, cloudy discharge in LUQ, very little in LUQ. Midline incision dressing c/d/i. Neuro: Alert, not oriented.  Psych: UTD, no insight due to  cognitive decline.  Time spent: 35 minutes.  Jerry Pour, MD [facility] [email or web address] Password [email or web address] 08/05/2018, 4:50 PM

## 2018-08-06 DIAGNOSIS — J9690 Respiratory failure, unspecified, unspecified whether with hypoxia or hypercapnia: Secondary | ICD-10-CM

## 2018-08-06 DIAGNOSIS — R0682 Tachypnea, not elsewhere classified: Secondary | ICD-10-CM

## 2018-08-06 DIAGNOSIS — J9622 Acute and chronic respiratory failure with hypercapnia: Secondary | ICD-10-CM

## 2018-08-06 DIAGNOSIS — J9621 Acute and chronic respiratory failure with hypoxia: Secondary | ICD-10-CM

## 2018-08-06 DIAGNOSIS — Z7189 Other specified counseling: Secondary | ICD-10-CM

## 2018-08-06 LAB — GLUCOSE, CAPILLARY
GLUCOSE-CAPILLARY: 151 mg/dL — AB (ref 70–99)
GLUCOSE-CAPILLARY: 137 mg/dL — AB (ref 70–99)
Glucose-Capillary: 162 mg/dL — ABNORMAL HIGH (ref 70–99)
Glucose-Capillary: 143 mg/dL — ABNORMAL HIGH (ref 70–99)

## 2018-08-06 MED ORDER — LORAZEPAM 1 MG PO TABS: 1.0000 mg | ORAL_TABLET | ORAL | Status: DC | PRN

## 2018-08-06 MED ORDER — HYDROMORPHONE HCL 1 MG/ML IJ SOLN
1.0000 mg | INTRAMUSCULAR | Status: DC | PRN
  Administered 2018-08-06: 1 mg via INTRAVENOUS
  Filled 2018-08-06: qty 1

## 2018-08-06 MED ORDER — GLYCOPYRROLATE 0.2 MG/ML IJ SOLN: 0.2000 mg | INTRAMUSCULAR | Status: DC | PRN

## 2018-08-06 MED ORDER — SODIUM CHLORIDE 0.9 % IV SOLN: 250.0000 mL | INTRAVENOUS | Status: DC | PRN

## 2018-08-06 MED ORDER — INSULIN GLARGINE 100 UNIT/ML ~~LOC~~ SOLN: 15.0000 [IU] | Freq: Every day | SUBCUTANEOUS | Status: DC

## 2018-08-06 MED ORDER — HYDROMORPHONE BOLUS VIA INFUSION
0.5000 mg | INTRAVENOUS | Status: DC | PRN
  Administered 2018-08-08: 0.5 mg via INTRAVENOUS
  Filled 2018-08-06: qty 1

## 2018-08-06 MED ORDER — SODIUM CHLORIDE 0.9 % IV SOLN
0.5000 mg/h | INTRAVENOUS | Status: DC
  Administered 2018-08-06: 0.5 mg/h via INTRAVENOUS
  Filled 2018-08-06: qty 5

## 2018-08-06 MED ORDER — SODIUM CHLORIDE 0.9% FLUSH: 3.0000 mL | INTRAVENOUS | Status: DC | PRN

## 2018-08-06 MED ORDER — LORAZEPAM 2 MG/ML IJ SOLN: 1.0000 mg | INTRAMUSCULAR | Status: DC | PRN

## 2018-08-06 MED ORDER — GLYCOPYRROLATE 1 MG PO TABS
1.0000 mg | ORAL_TABLET | ORAL | Status: DC | PRN
  Filled 2018-08-06: qty 1

## 2018-08-06 MED ORDER — LORAZEPAM 2 MG/ML PO CONC: 1.0000 mg | ORAL | Status: DC | PRN

## 2018-08-06 MED ORDER — SODIUM CHLORIDE 0.9% FLUSH: 3.0000 mL | Freq: Two times a day (BID) | INTRAVENOUS | Status: DC

## 2018-08-06 NOTE — Progress Notes (Signed)
Pulse 120, Resp 120. SOB. MD notified.

## 2018-08-06 NOTE — Progress Notes (Signed)
Daily Progress Note   Patient Name: Jerry Green       Date: 08/06/2018 DOB: 1956-04-12  Age: 62 y.o. MRN#: [id number] Attending Physician: Patrecia Pour, MD Primary Care Physician: Ollen Bowl, MD Admit Date: 07/05/2018  Reason for Consultation/Follow-up: Non pain symptom management  Subjective: Patient in bed, eyes closed, opens eyes briefly to my voice. He is breathing rapidly but shallow, pulse is increased. Per discussion with Dr. Bonner Puna and patient's daughter patient has been transitioned to comfort measures only.    Review of Systems  Unable to perform ROS: Acuity of condition    Length of Stay: 27  Current Medications: Scheduled Meds:  . sodium chloride flush  3 mL Intravenous Q12H    Continuous Infusions: . sodium chloride      PRN Meds: sodium chloride, glycopyrrolate **OR** glycopyrrolate **OR** glycopyrrolate, HYDROmorphone (DILAUDID) injection, LORazepam **OR** LORazepam **OR** LORazepam, sodium chloride flush, sodium chloride flush  Physical Exam  Constitutional: He appears well-developed and well-nourished. He appears lethargic.  Cardiovascular: Tachycardia present.  Peripheral pulses weak, feet cool  Pulmonary/Chest: Tachypnea noted.  Neurological: He appears lethargic.  Vitals reviewed.           Vital Signs: BP (!) 136/98 (BP Location: Left Arm)   Pulse (!) 120   Temp 98.3 F (36.8 C) (Oral)   Resp (!) 48   Ht 6\' 2"  (1.88 m)   Wt (!) 153.8 kg   SpO2 99%   BMI 43.53 kg/m  SpO2: SpO2: 99 % O2 Device: O2 Device: Room Air O2 Flow Rate: O2 Flow Rate (L/min): 1 L/min  Intake/output summary:   Intake/Output Summary (Last 24 hours) at 08/06/2018 1104 Last data filed at 08/06/2018 0017 Gross per 24 hour  Intake 270 ml  Output 1135 ml  Net -865  ml   LBM: Last BM Date: 08/06/18 Baseline Weight: Weight: 120.4 kg Most recent weight: Weight: (!) 153.8 kg       Palliative Assessment/Data: PPS: 10%     Patient Active Problem List   Diagnosis Date Noted  . Malignant neoplasm of colon ([place])   . Liver metastases ([place])   . Palliative care by specialist   . Metastatic adenocarcinoma ([place])   . Metastatic cancer ([place])   . DNR (do not resuscitate)   . Terminal care   . Intraabdominal  fluid collection   . Fever   . Acute respiratory failure with hypoxia ([place])   . Perforated colon cancer of splenic flexure s/p colectomy/colostomy 07/16/2018 07/19/2018  . 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 Ileus, postoperative ([place]) 07/19/2018  . Colostomy in place [facility]) 07/19/2018  . Cancer of splenic flexure of colon pT4b, pN1b M1   . Bowel obstruction ([place]) 07/26/2018  . Peritoneal carcinomatosis ([place]) 08/02/2018  . Essential hypertension 07/31/2018  . GERD (gastroesophageal reflux disease) 07/31/2018  . Leukocytosis 07/12/2018    Palliative Care Assessment & Plan   Patient Profile: 62 y.o.malewith past medical history of DM, gerd, HTNwho was admitted on 10/8/2019as a transfer from [facility] in [place], [place]. He was found to have a perforated bowel secondary to a large obstructing mass of the splenic flexture with metastases to the liver and peritoneal wall. He subsequently underwent left hemi-colectomy with colostomy and liver biopsy - pathology revealed invasive adenocarcinoma. He was able to be extubated several days after surgery but required re-intubation on 10/24. He has developed multiple abscesses and acute renal failure. Recent imaging shows LLL airspace consolidation and 9 mm LUL nodule. There is no role for chemo or any other cancer treatment at this point as he is simply to ill. While there is no indication for HD at this point, the patient is not a good dialysis candidate. Extubated 10.27 but developed respiratory  distress requiring nebs, lasix, fentanyl infusion, and BiPAP. Now transitioned to comfort measures only.  Assessment/Recommendations/Plan   Patient is dying  Comfort measures only  Recommend lorazepam if hydromorphone is ineffective at decreasing patient's RR and HR  D/C insulin and CBG checks  Will start hydromorphone continuous infusion for patient's respiratory status (GFR <30 prohibits morphine)  Other comfort medications as ordered  Patient does not appear stable for transport to hospice facility  Dr. Bonner Puna discussed patient's prognosis and anticipated hospital death with patient's daughter this morning- I attempted to call her but was unable to reach her  Goals of Care and Additional Recommendations:  Limitations on Scope of Treatment: Full Comfort Care  Code Status:  DNR  Prognosis:   Hours - Days  Discharge Planning:  Anticipated Hospital Death  Care plan was discussed with Dr. Bonner Puna and patient's RN.  Thank you for allowing the Palliative Medicine Team to assist in the care of this patient.   Time In: 1040 Time Out: 1120 Total Time 40 mins Prolonged Time Billed no      Greater than 50%  of this time was spent counseling and coordinating care related to the above assessment and plan.  Mariana Kaufman, AGNP-C Palliative Medicine   Please contact Palliative Medicine Team phone at [phone] for questions and concerns.

## 2018-08-06 NOTE — Progress Notes (Signed)
Referring Physician(s): Ogbata  Supervising Physician: Aletta Edouard  Patient Status:  [facility] - In-pt  Chief Complaint:  Perforated colon cancer with multiple abscesses  Subjective:  Patient minimally responsive. Only repsponded to painful stimuli (removal of tape from skin).  He is now transitioned to comfort care only and we are asked to remove drains.  Allergies: Aspirin  Medications: Prior to Admission medications   Medication Sig Start Date End Date Taking? Authorizing Provider  allopurinol (ZYLOPRIM) 100 MG tablet Take 100 mg by mouth daily. 06/14/18  Yes [provider]  enalapril (VASOTEC) 5 MG tablet Take 5 mg by mouth daily. 06/14/18  Yes [provider]  glimepiride (AMARYL) 2 MG tablet Take 2 mg by mouth 2 (two) times daily. 07/04/18  Yes [provider]  metFORMIN (GLUCOPHAGE) 1000 MG tablet Take 1,000 mg by mouth 2 (two) times daily. 06/14/18  Yes [provider]  pantoprazole (PROTONIX) 40 MG tablet Take 40 mg by mouth daily. 06/14/18  Yes [provider]  simvastatin (ZOCOR) 40 MG tablet Take 40 mg by mouth daily. 06/14/18  Yes [provider]     Vital Signs: BP (!) 136/98 (BP Location: Left Arm)   Pulse (!) 120   Temp 98.3 F (36.8 C) (Oral)   Resp (!) 48   Ht 6\' 2"  (1.88 m)   Wt (!) 153.8 kg   SpO2 99%   BMI 43.53 kg/m   Physical Exam Minimal response, only opens eyes. RUQ and LUQ drains removed without difficulty and dry dressing placed.  Imaging: No results found.  Labs:  CBC: Recent Labs    08/01/18 0433 08/02/18 0434 08/03/18 0421 08/04/18 0504  WBC 13.6* 14.0* 13.0* 16.4*  HGB 8.0* 9.3* 9.7* 11.1*  HCT 26.1* 29.5* 32.7* 36.2*  PLT 520* 540* 604* 598*    COAGS: Recent Labs    07/30/2018 0701 07/07/2018 0748 07/05/2018 1402 07/21/18 0352  INR 1.13  --  1.23 1.50  APTT  --  33  --   --     BMP: Recent Labs    08/01/18 0433 08/02/18 0434 08/03/18 0421 08/04/18 0504    NA 147* 150* 154* 157*  K 3.2* 3.7 3.8 3.8  CL 115* 126* 128* >130*  CO2 15* 14* 16* 17*  GLUCOSE 242* 127* 127* 172*  BUN 119* 123* 104* 95*  CALCIUM 8.8* 9.5 9.4 9.8  CREATININE 3.43* 3.56* 3.58* 3.64*  GFRNONAA 18* 17* 17* 16*  GFRAA 20* 19* 19* 19*    LIVER FUNCTION TESTS: Recent Labs    07/19/18 0009 07/21/18 0035 07/23/18 0346 07/25/18 0500  08/01/18 0433 08/02/18 0434 08/03/18 0421 08/04/18 0504  BILITOT 1.9* 1.4* 1.0 1.0  --   --   --   --   --   AST 137* 148* 94* 138*  --   --   --   --   --   ALT 94* 137* 114* 105*  --   --   --   --   --   ALKPHOS 119 128* 125 119  --   --   --   --   --   PROT 7.8 8.1 8.8* 8.9*  --   --   --   --   --   ALBUMIN 2.5* 2.4* 2.1* 2.0*   < > 1.7* 2.0* 2.0* 2.3*   < > = values in this interval not displayed.    Assessment and Plan:  Perforated cancer with extensive mets = patient  now comfort care only.  Drains removed without difficulty. (request)  No need to remove dressings to minimize painful stimuli, unless they become saturated/soiled.  Electronically Signed: Murrell Redden, PA-C 08/06/2018, 12:55 PM    I spent a total of 15 Minutes at the the patient's bedside AND on the patient's hospital floor or unit, greater than 50% of which was counseling/coordinating care for drain removal.

## 2018-08-06 NOTE — Progress Notes (Signed)
Patient ID: Jerry Green, male   DOB: 1956-09-02, 62 y.o.   MRN: [id number]    26 Days Post-Op  Subjective: Pt just having issues with tachycardia and tachypnea.  RR in 70s.  Given some dilaudid and is now very comfortable.  Will open his eyes to his name, but otherwise doesn't speak and just lays in bed sleeping.  Isn't eating/drinking much at all due to AMS  Objective: Vital signs in last 24 hours: Temp:  [98.3 F (36.8 C)] 98.3 F (36.8 C) (11/04 0528) Pulse Rate:  [120-124] 120 (11/04 0758) Resp:  [21-48] 48 (11/04 0758) BP: (136)/(98) 136/98 (11/04 0528) SpO2:  [99 %] 99 % (11/04 0528) Last BM Date: 08/06/18  Intake/Output from previous day: 11/03 0701 - 11/04 0700 In: 330 [P.O.:60; I.V.:120] Out: 1135 [Urine:950; Drains:10; Stool:175] Intake/Output this shift: No intake/output data recorded.  PE: Heart: tachy Lungs: CTAB, but increase rate, WOB is without distress currently Abd: stable, midline dehisced with mepitel in place and WD dressing.  RUQ drain with minimal output, same as LLQ and LUQ drains as well.  Lab Results:  Recent Labs    08/04/18 0504  WBC 16.4*  HGB 11.1*  HCT 36.2*  PLT 598*   BMET Recent Labs    08/04/18 0504  NA 157*  K 3.8  CL >130*  CO2 17*  GLUCOSE 172*  BUN 95*  CREATININE 3.64*  CALCIUM 9.8   PT/INR No results for input(s): LABPROT, INR in the last 72 hours. CMP     Component Value Date/Time   NA 157 (H) 08/04/2018 0504   K 3.8 08/04/2018 0504   CL >130 (HH) 08/04/2018 0504   CO2 17 (L) 08/04/2018 0504   GLUCOSE 172 (H) 08/04/2018 0504   BUN 95 (H) 08/04/2018 0504   CREATININE 3.64 (H) 08/04/2018 0504   CALCIUM 9.8 08/04/2018 0504   PROT 8.9 (H) 07/25/2018 0500   ALBUMIN 2.3 (L) 08/04/2018 0504   AST 138 (H) 07/25/2018 0500   ALT 105 (H) 07/25/2018 0500   ALKPHOS 119 07/25/2018 0500   BILITOT 1.0 07/25/2018 0500   GFRNONAA 16 (L) 08/04/2018 0504   GFRAA 19 (L) 08/04/2018 0504   Lipase  No results found for:  LIPASE     Studies/Results: No results found.  Anti-infectives: Anti-infectives (From admission, onward)   Start     Dose/Rate Route Frequency Ordered Stop   07/27/18 1100  linezolid (ZYVOX) IVPB 600 mg  Status:  Discontinued     600 mg 300 mL/hr over 60 Minutes Intravenous Every 12 hours 07/27/18 1031 08/04/18 1415   07/23/18 1500  anidulafungin (ERAXIS) 100 mg in sodium chloride 0.9 % 100 mL IVPB  Status:  Discontinued     100 mg 78 mL/hr over 100 Minutes Intravenous Every 24 hours 07/22/18 1420 08/04/18 1415   07/22/18 2000  DAPTOmycin (CUBICIN) 850 mg in sodium chloride 0.9 % IVPB  Status:  Discontinued     850 mg 234 mL/hr over 30 Minutes Intravenous Every 48 hours 07/22/18 1459 07/27/18 1031   07/22/18 1530  anidulafungin (ERAXIS) 200 mg in sodium chloride 0.9 % 200 mL IVPB     200 mg 78 mL/hr over 200 Minutes Intravenous  Once 07/22/18 1420 07/22/18 1941   07/10/2018 1800  piperacillin-tazobactam (ZOSYN) IVPB 3.375 g  Status:  Discontinued     3.375 g 12.5 mL/hr over 240 Minutes Intravenous Every 8 hours 07/25/2018 1444 08/04/18 1415   08/02/2018 0915  cefoTEtan (CEFOTAN) 2 g in sodium chloride  0.9 % 100 mL IVPB     2 g 200 mL/hr over 30 Minutes Intravenous To Short Stay 07/04/2018 0857 07/12/2018 1900   07/30/2018 0906  cefoTEtan in Dextrose 5% (CEFOTAN) 2-2.08 GM-%(50ML) IVPB    Note to Pharmacy:  [name]   : cabinet override      07/12/2018 0906 07/15/2018 2114   07/21/2018 1200  piperacillin-tazobactam (ZOSYN) IVPB 3.375 g  Status:  Discontinued     3.375 g 12.5 mL/hr over 240 Minutes Intravenous Every 8 hours 07/12/2018 0615 07/12/2018 1341   07/03/2018 0730  cefoTEtan (CEFOTAN) 2 g in sodium chloride 0.9 % 100 mL IVPB  Status:  Discontinued     2 g 200 mL/hr over 30 Minutes Intravenous On call to O.R. 07/06/2018 0723 07/25/2018 0559   07/31/2018 0615  piperacillin-tazobactam (ZOSYN) IVPB 3.375 g     3.375 g 100 mL/hr over 30 Minutes Intravenous  Once 07/26/2018 0612 07/20/2018 1943        Assessment/Plan Perforated splenic flexure colon cancer with large and small bowel obstruction  Metastatic Colon adenocarcinoma - liver Bx also positive S/P Exploratory laparotomy, left colectomy and liver biopsy, 07/18/2018, Dr. Rolm Bookbinder POD#[id number] Abdominal wound dehiscence--no evisceration. Does not need any reoperative surgery.  - continue dressing changes with mepitel in wound base. -IR abdominal drain placement x 2 - 07/20/18 - minimal output, CX no growth,  - patient on comfort measures and awaiting transfer to hospice in [place] -given this will likely plan to have all drains removed as they aren't doing much at all and this will help with comfort. - continuecomfort feeds/sips of liquids  FEN: comfort feeds ID:all abx DC DVT: comfort care Foley: present  Follow up: Dr. Gertie Gowda needed   LOS: 27 days    [name] , [facility] Surgery 08/06/2018, 10:00 AM Pager: [phone]

## 2018-08-06 NOTE — Progress Notes (Signed)
Nutrition Brief Note  Chart reviewed. Pt now transitioning to comfort care.  No further nutrition interventions warranted at this time.  Please re-consult as needed.   Kairav Russomanno A. Jenene Kauffmann, RD, LDN, CDE Pager: [phone] After hours Pager: [phone]  

## 2018-08-06 NOTE — Progress Notes (Signed)
PROGRESS NOTE  Jerry Green  QQV:[id number] DOB: Dec 04, 1955 DOA: 08/02/2018 PCP: Ollen Bowl, MD   Brief Narrative: Jerry Green is a 62 y.o. male with a history of T2DM, HTN, and GERD who presented initially to [facility] in [place] with progressive RLQ abdominal pain, weight loss found to have a perforated obstructing splenic flexure mass with evidence of lymphatic and liver metastases and peritoneal carcinomatosis. Due to no GI availability, he was transferred to [facility], arriving 10/8 and taken for exploratory laparotomy, hemicolectomy and liver biopsy 10/9, open abdominal wound with vac. He was kept in the ICU, intubated on pressors with evidence of AKI. TNA started 10/15. Pathology returned with adenocarcinoma of the colon and liver biopsy also positive. Oncology was consulted and recommended initiating chemotherapy in the outpatient setting pending clinical course. Unfortunately, the postoperative course was complicated by encephalopathy, respiratory failure, peritonitis with multifocal abscesses for which drains were placed 10/18, and acute renal failure. Nephrology was consulted but did not recommend initiation of dialysis due to stage IV colon cancer. Supportive measures were continued including prolonged antibiotics/antifungal per ID, ventilatory support/reintubation 10/24 due to decreased mental status, bicarbonate . CT of the chest also demonstrated pulmonary nodules suspected metastases. Palliative care was consulted on 10/24, discussed grim prognosis with his HCPOA and only child, Jerry Green, who requested the patient be DNR but continue aggressive interventions. He required transfusions for anemia and developed fevers but ultimately stabilized enough to be weaned from the vent 10/27 and extubated to BiPAP and ultimately to supplemental oxygen alone. Encephalopathy has not improved and work up for reversible causes has been negative to date. The daughter had requested  comfort measures. He is stable for transport to a hospice facility.  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  - Continue supplemental oxygen prn dyspnea.  - WOB increasing, tachypneic, agree with dilaudid gtt  Acute metabolic encephalopathy: Likely also complicated by acute delirium due to medical conditions, though is certainly multifactorial, namely due to sepsis, possibly uremia, and hypernatremia. MRI without acute findings 10/22 nonacute, volume loss in brainstem and cerebellum as well as mild-moderate white matter changes consistent with small vessel disease. B12 405. TSH 2.395. - No further interventions.   Sepsis due to peritonitis, intraabdominal abscesses due to perforated colon: s/p left hemicolectomy. Culture data unhelpful. Has not had resolution of leukocytosis. - Stopped antibiotics per [place] discussion 11/2.  - Wound dehiscence noted, no evisceration, care per general surgery. Aim to minimize discomfort. - Pull drains today due to comfort measures. - Comfort feeds ok, not desirous of solids, stopped TPN.  Acute renal failure: No further lab draws, use  judicious medications as able and avoid nephrotoxins.  Hypernatremia: Free water deficit.  - Continue comfort feeds. No IVF desired by HCPOA.  Stage IV adenocarcinoma: CEA 94.4. - Oncology has been following, currently not a candidate for therapy. Very advanced disease makes prognosis poor. Not planning Tx. - Goal of therapy is comfort as this is an incurable diagnosis.   T2DM: HbA1c 7.4%, hyperglycemic here due to stress, infection, steroids.  - Continue decreased insulin to avoid precipitating DKA.  Moderate protein-calorie malnutrition:  - Diet as tolerated including protein supplementation   Acute blood loss anemia on anemia of acute and chronic disease: Hgb stable, required transfusions 10/13 and 10/25. No bleeding. No more lab draws.  DVT prophylaxis: Comfort care Code Status: DNR Family Communication: Spoke with daughter by phone this morning advising her that he is too unstable for transfer and that I anticipate death in the hospital within hours (possibly today) to days.  Disposition Plan: Patient's status is worsening, anticipate inpatient demise.   Consultants:   PCCM  IR  General surgery  Palliative care  Procedures:  07/31/2018: Dr. Donne Hazel.  1.  Exploratory laparotomy 2.  Left colectomy 3.  Biopsy of liver lesion  10/9 ETT >> 10/9 NGT >> 10/9 Foley >> 10/9 R DL IJ CVC >> 10/14 PICC placed 10/15 TNA initiated  Antimicrobials: Zosyn 10/8 - 11/2 Linezolid 10/25 - 11/2 Anidulafungin 10/20 - 11/2  Subjective: Appears significantly less alert this morning, more labored respirations. Still shakes his head no when asked if he is in pain. Later this afternoon on reevaluation, more drowsy and withdraws to painful stimuli only.  Objective: BP (!) 136/98 (BP Location: Left Arm)   Pulse (!) 120   Temp 98.3 F (36.8 C) (Oral)   Resp 20   Ht 6\' 2"  (1.88 m)   Wt (!) 153.8 kg   SpO2 99%   BMI 43.53 kg/m   Gen: Ill male in respiratory distress this AM,  improved this PM. Pulm: Tachypneic, shallow breaths, diffusely coarse. CV: Regular tachycardia. No new murmur, rub, or gallop. No JVD, no dependent edema. GI: Abdomen with midline incisional wound, dressing c/d/i, drains in stable position this AM. Ext: Warm, no deformities Skin: No new rashes, lesions or ulcers on visualized skin.  Neuro: Lethargic, responsive to loud voice and painful stimuli.  Psych: UTD  Time spent: 35 minutes.  Patrecia Pour, MD [facility] [email or web address] Password [facility] 08/06/2018, 4:26 PM

## 2018-08-06 NOTE — Social Work (Signed)
CSW acknowledging consult for comfort care. Per bedside RN, pt not currently stable for transport/discharge to residential hospice. Will follow for disposition should hospice services or residential hospice become appropriate.   Alexander Mt, [place] Work [phone]

## 2018-08-07 LAB — GLUCOSE, CAPILLARY
GLUCOSE-CAPILLARY: 157 mg/dL — AB (ref 70–99)
Glucose-Capillary: 147 mg/dL — ABNORMAL HIGH (ref 70–99)
Glucose-Capillary: 165 mg/dL — ABNORMAL HIGH (ref 70–99)

## 2018-08-07 NOTE — Progress Notes (Signed)
PROGRESS NOTE  Jerry Green  SJG:[id number] DOB: 1956/06/23 DOA: 07/25/2018 PCP: Ollen Bowl, MD   Brief Narrative: Jerry Green is a 62 y.o. male with a history of T2DM, HTN, and GERD who presented initially to [facility] in [place] with progressive RLQ abdominal pain, weight loss found to have a perforated obstructing splenic flexure mass with evidence of lymphatic and liver metastases and peritoneal carcinomatosis. Due to no GI availability, he was transferred to [facility], arriving 10/8 and taken for exploratory laparotomy, hemicolectomy and liver biopsy 10/9, open abdominal wound with vac. He was kept in the ICU, intubated on pressors with evidence of AKI. TNA started 10/15. Pathology returned with adenocarcinoma of the colon and liver biopsy also positive. Oncology was consulted and recommended initiating chemotherapy in the outpatient setting pending clinical course. Unfortunately, the postoperative course was complicated by encephalopathy, respiratory failure, peritonitis with multifocal abscesses for which drains were placed 10/18, and acute renal failure. Nephrology was consulted but did not recommend initiation of dialysis due to stage IV colon cancer. Supportive measures were continued including prolonged antibiotics/antifungal per ID, ventilatory support/reintubation 10/24 due to decreased mental status, bicarbonate . CT of the chest also demonstrated pulmonary nodules suspected metastases. Palliative care was consulted on 10/24, discussed grim prognosis with his HCPOA and only child, [name], who requested the patient be DNR but continue aggressive interventions. He required transfusions for anemia and developed fevers but ultimately stabilized enough to be weaned from the vent 10/27 and extubated to BiPAP and ultimately to supplemental oxygen alone. Encephalopathy has not improved and work up for reversible causes has been negative to date. The daughter had requested  comfort measures. He is stable for transport to a hospice facility.  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  - Continue supplemental oxygen prn dyspnea. Does not seem to require at this time. - Continue dilaudid gtt at 0.5mg /hr with prn boluses.  Acute metabolic encephalopathy: Likely also complicated by acute delirium due to medical conditions, though is certainly multifactorial, namely due to sepsis, possibly uremia, and hypernatremia. MRI without acute findings 10/22 nonacute, volume loss in brainstem and cerebellum as well as mild-moderate white matter changes consistent with small vessel disease. B12 405. TSH 2.395. - No further interventions.   Sepsis due to peritonitis, intraabdominal abscesses due to perforated colon: s/p left hemicolectomy. Culture data unhelpful. Has not had resolution of leukocytosis. - Stopped antibiotics per [place] discussion 11/2.  - Wound dehiscence noted, no evisceration, care per general surgery. Aim to minimize discomfort. - Pulled drains due to comfort measures. - Stopped TPN, not taking any po.  Acute renal failure:  - No further lab  draws, use judicious medications  as able and avoid nephrotoxins.    Hypernatremia: Free water deficit.  - No IVF desired by HCPOA.  Stage IV adenocarcinoma: CEA 94.4. - Oncology was following, currently not a candidate for therapy. Very advanced disease makes prognosis poor. Not planning Tx. - Goal of therapy is comfort as this is an incurable diagnosis.   T2DM: HbA1c 7.4%, hyperglycemic here due to stress, infection, steroids.  - DC CBGs, SSI  Moderate protein-calorie malnutrition:  - Diet as tolerated, no further interventions.  Acute blood loss anemia on anemia of acute and chronic disease: Hgb stable, required transfusions 10/13 and 10/25. No bleeding. No more lab draws.  DVT prophylaxis: Comfort care Code Status: DNR Family Communication: Brother by phone. Daughter by phone yesterday. Disposition Plan: Anticipate inpatient death. This has been shared with his family.   Consultants:   PCCM  IR  General surgery  Palliative care  Procedures:  07/12/2018: Dr. Donne Hazel.  1.  Exploratory laparotomy 2.  Left colectomy 3.  Biopsy of liver lesion  10/9 ETT >> 10/9 NGT >> 10/9 Foley >> 10/9 R DL IJ CVC >> 10/14 PICC placed 10/15 TNA initiated  Antimicrobials: Zosyn 10/8 - 11/2 Linezolid 10/25 - 11/2 Anidulafungin 10/20 - 11/2  Subjective: Has appeared more comfortable over past 24 hours on dilaudid gtt.   Objective: BP (!) 136/98 (BP Location: Left Arm)   Pulse (!) 120   Temp 98.3 F (36.8 C) (Oral)   Resp 20   Ht 6\' 2"  (1.88 m)   Wt (!) 153.8 kg   SpO2 99%   BMI 43.53 kg/m   Gen: Resting quietly, no grimace Pulm: Nonlabored, coarse. CV: Regular tachycardia. No JVD, no dependent edema. GI: Abdomen soft, not significantly tender with palpation, +BS, no changes.  Ext: Warm, no deformities Skin: Midline wound in abd stable, no purulence. Drain sites c/d/i Neuro: Somewhat responsive to voice.  Psych: UTD.  Time spent: 35 minutes.  Patrecia Pour,  MD [facility] [email or web address] Password [email or web address] 08/07/2018, 1:56 PM

## 2018-08-07 NOTE — Progress Notes (Addendum)
Daily Progress Note   Patient Name: Jerry Green       Date: 08/07/2018 DOB: May 13, 1956  Age: 62 y.o. MRN#: [id number] Attending Physician: Patrecia Pour, MD Primary Care Physician: Ollen Bowl, MD Admit Date: 07/14/2018  Reason for Consultation/Follow-up: Non pain symptom management  Subjective: Patient in bed, eyes half closed, he opens eyes briefly to voice and closes them again. His respirations are steady. No signs of distress. Per nursing, no oral intake. He has water at the bedside with straw, but does not close mouth when straw is introduced. Attempted to call daughter to offer support. Phone call did not ring and went to voicemail with message left.    Review of Systems  Unable to perform ROS: Acuity of condition   Physical exam: Weak and frail appearing. No distress noted Head is normocephalic No respiratory distress noted Hands warm to touch Feet cool to touch  Length of Stay: 28  Current Medications: Scheduled Meds:  . sodium chloride flush  3 mL Intravenous Q12H    Continuous Infusions: . sodium chloride    . HYDROmorphone 0.5 mg/hr (08/06/18 1213)    PRN Meds: sodium chloride, glycopyrrolate **OR** glycopyrrolate **OR** glycopyrrolate, HYDROmorphone, LORazepam **OR** LORazepam **OR** LORazepam, sodium chloride flush, sodium chloride flush            Vital Signs: BP (!) 136/98 (BP Location: Left Arm)   Pulse (!) 120   Temp 98.3 F (36.8 C) (Oral)   Resp 20   Ht 6\' 2"  (1.88 m)   Wt (!) 153.8 kg   SpO2 99%   BMI 43.53 kg/m   SpO2: SpO2: 99 % O2 Device: O2 Device: Room Air O2 Flow Rate: O2 Flow Rate (L/min): 1 L/min  Intake/output summary:   Intake/Output Summary (Last 24 hours) at 08/07/2018 1415 Last data filed at 08/07/2018 0408 Gross per 24  hour  Intake 94 ml  Output 200 ml  Net -106 ml   LBM: Last BM Date: 08/07/18 Baseline Weight: Weight: 120.4 kg Most recent weight: Weight: (!) 153.8 kg       Palliative Assessment/Data: PPS: 10%     Patient Active Problem List   Diagnosis Date Noted  . Respiratory failure ([place])   . Tachypnea   . Advanced care planning/counseling discussion   . Malignant neoplasm of colon ([place])   .  Liver metastases ([place])   . Palliative care by specialist   . Metastatic adenocarcinoma ([place])   . Metastatic cancer ([place])   . DNR (do not resuscitate)   . Terminal care   . Intraabdominal fluid collection   . Fever   . Acute respiratory failure with hypoxia ([place])   . Perforated colon cancer of splenic flexure s/p colectomy/colostomy 07/08/2018 07/19/2018  . 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 Ileus, postoperative ([place]) 07/19/2018  . Colostomy in place [facility]) 07/19/2018  . Cancer of splenic flexure of colon pT4b, pN1b M1   . Bowel obstruction ([place]) 07/03/2018  . Peritoneal carcinomatosis ([place]) 07/05/2018  . Essential hypertension 07/29/2018  . GERD (gastroesophageal reflux disease) 07/03/2018  . Leukocytosis 07/09/2018    Palliative Care Assessment & Plan   Patient Profile: 62 y.o.malewith past medical history of DM, gerd, HTNwho was admitted on 10/8/2019as a transfer from [facility] in [place], [place]. He was found to have a perforated bowel secondary to a large obstructing mass of the splenic flexture with metastases to the liver and peritoneal wall. He subsequently underwent left hemi-colectomy with colostomy and liver biopsy - pathology revealed invasive adenocarcinoma. He was able to be extubated several days after surgery but required re-intubation on 10/24. He has developed multiple abscesses and acute renal failure. Recent imaging shows LLL airspace consolidation and 9 mm LUL nodule. There is no role for chemo or any other cancer treatment at this point as he is simply to  ill. While there is no indication for HD at this point, the patient is not a good dialysis candidate. Extubated 10.27 but developed respiratory distress requiring nebs, lasix, fentanyl infusion, and BiPAP. Now transitioned to comfort measures only.  Assessment/Recommendations/Plan   Patient is dying  Comfort measures only  Hydromorphone continuous infusion in place for patient's respiratory status (GFR <30 prohibits morphine) PRN bolus doses available if needed. No bolus doses given thus far.   Other comfort medications as ordered  Patient does not appear stable for transport to hospice facility  Goals of Care and Additional Recommendations:  Limitations on Scope of Treatment: Full Comfort Care  Code Status:  DNR  Prognosis:   Hours - Days  Discharge Planning:  Anticipated Hospital Death  Care plan was discussed with primary  RN.  Thank you for allowing the Palliative Medicine Team to assist in the care of this patient.   Total Time 25 mins Prolonged Time Billed no      Greater than 50%  of this time was spent counseling and coordinating care related to the above assessment and plan.    Please contact Palliative Medicine Team phone at [phone] for questions and concerns.

## 2018-08-07 NOTE — Consult Note (Signed)
[place] Nurse ostomy follow up Surgical team following for assessment and plan of care to abd wound.  Stomal assessment/size:1 1/2 inches, red and viable, flush with skin level. Peristomal assessment: Intact skin surrounding Treatment options for stomal/peristomal skin:barrier ring Output: small amt liquid brown stool Ostomy pouching: 2pc.2 3/4" Education provided:Pt is confused and there is no family present Enrolled patient in [place] Start Discharge program:No Applied barrier ring to attempt to maintain a seal and 1 piece pouching system.  Supplies at the bedside for staff nurse use.   Pt now with comfort care goals. Please re-consult if further assistance is needed.  Thank-you,  Julien Girt MSN, [place], [place], [place], [place]

## 2018-08-09 NOTE — Progress Notes (Signed)
Patient ready to be transported to morgue. Post-mortem checklist complete. Death certificate signed by MD Opyd.

## 2018-08-09 NOTE — Progress Notes (Signed)
41mL of dilaudid gtt wasted in sink with Berniece Salines, RN. Death witnessed by Blanch Media, RN.

## 2018-09-02 NOTE — Social Work (Signed)
Per PMT note yesterday pt does not appear stable for transport to hospice home. Should pt become stable for residential hospice please let CSW know.  Westley Hummer, MSW, [place] Work [phone]

## 2018-09-02 NOTE — Discharge Summary (Addendum)
Death Summary  Jerry Green EXB:[id number] DOB: 1956/02/23 DOA: 02-Aug-2018  PCP: Ollen Bowl, MD  Admit date: August 02, 2018 Date of Death: 2018-08-31 Time of Death: Jan 19, 2308 Notification: Ollen Bowl, MD notified of death of 09-01-18   History of present illness:  Jerry Green was a 62 y.o. male with a history of T2DM, HTN, and GERD who presented initially to [facility] in [place] with progressive RLQ abdominal pain, weight loss found to have a perforated obstructing splenic flexure mass with evidence of lymphatic and liver metastases and peritoneal carcinomatosis. Due to no GI availability, he was transferred to [facility], arriving 08-03-23 and taken for exploratory laparotomy, hemicolectomy and liver biopsy 10/9, open abdominal wound with vac. He was kept in the ICU, intubated on pressors with evidence of AKI. TNA started 10/15. Pathology returned with adenocarcinoma of the colon and liver biopsy also positive. Oncology was consulted and recommended initiating chemotherapy in the outpatient setting pending clinical course. Unfortunately, the postoperative course was complicated by encephalopathy, respiratory failure, peritonitis with multifocal abscesses for which drains were placed 10/18, and acute renal failure. Nephrology was consulted but did not recommend initiation of dialysis due to stage IV colon cancer. Supportive measures were continued including prolonged antibiotics/antifungal per ID, ventilatory support/reintubation 10/24 due to decreased mental status, bicarbonate . CT of the chest also demonstrated pulmonary nodules suspected metastases. Palliative care was consulted on 10/24, discussed grim prognosis with his HCPOA and only child, [name], who requested the patient be DNR but continue aggressive interventions. He required transfusions for anemia and developed fevers but ultimately stabilized enough to be weaned from the vent 10/27 and extubated to BiPAP and ultimately  to supplemental oxygen alone. Encephalopathy has not improved and work up for reversible causes has been negative to date. The daughter had requested comfort measures. On the evening of 2018/08/31 at Jan 19, 2308, the patient had expired. Family was notified.  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   Ileus, postoperative (HCC)   Colostomy in place [facility])   Acute respiratory failure with hypoxia ([place])   Metastatic cancer ([place])   DNR (do not resuscitate)   Terminal care   Intraabdominal fluid collection   Fever   Liver metastases ([place])   Palliative care by specialist   Metastatic adenocarcinoma ([place])   Malignant neoplasm of colon ([place])   Respiratory failure ([place])   Tachypnea   The results of significant diagnostics from this hospitalization (including imaging, microbiology, ancillary and laboratory) are listed below for reference.    Significant Diagnostic Studies: Ct Abdomen Pelvis Wo Contrast  Result Date: 07/26/2018 CLINICAL DATA:  Sepsis, perforated cancer of the splenic flexure of the colon post hemicolectomy, postoperative abscesses EXAM: CT CHEST, ABDOMEN AND PELVIS WITHOUT CONTRAST TECHNIQUE: Multidetector CT imaging of the chest, abdomen and pelvis was performed following the standard protocol without IV contrast. Neither oral nor intravenous contrast were administered. Sagittal and coronal MPR images reconstructed from axial data set. COMPARISON:  CT abdomen and pelvis 07/19/2018 FINDINGS: CT CHEST FINDINGS Cardiovascular: Minimal atherosclerotic calcifications aorta and coronary arteries. Aorta normal caliber. No pericardial effusion.  Mediastinum/Nodes: Tip of endotracheal tube above carina. Tip of RIGHT arm PICC line at  cavoatrial junction. Nasogastric tube traverses esophagus, esophagus otherwise unremarkable. No mediastinal adenopathy. Base of cervical region normal appearance. Lungs/Pleura: LEFT pleural effusion with compressive atelectasis of the posterior LEFT lung. Dependent atelectasis RIGHT lower lobe. RIGHT upper lobe pulmonary nodule 3 mm diameter image 49. RIGHT upper lobe pulmonary nodule 4 mm diameter image 29. Additional small nodule in RIGHT lower lobe adjacent to major fissure 5 mm diameter image 61. 6 mm LEFT upper lobe nodule image 48. No pneumothorax. Musculoskeletal: No acute osseous findings. CT ABDOMEN PELVIS FINDINGS Hepatobiliary: Beam hardening artifacts from arms traverse liver and spleen. Pigtail drainage catheter superiorly in liver. Persistent air and fluid containing abscess collection at anterior liver, anterior to the pigtail, 4.6 x 4.3 x 4.0 cm. No definite additional liver lesions Pancreas: Normal appearance Spleen: Predominately obscured by beam hardening artifacts. Adjacent pigtail drainage catheter posterior to spleen. Adrenals/Urinary Tract: Adrenal glands unremarkable. Kidneys grossly normal appearance. No definite hydronephrosis or hydroureter. Bladder decompressed by Foley catheter. Stomach/Bowel: Transverse colostomy RIGHT upper quadrant. Long Hartmann pouch to the level of the descending colon. Stomach decompressed. Small bowel loops unremarkable. Vascular/Lymphatic: Atherosclerotic calcifications aorta and iliac arteries. 12 mm LEFT para-aortic node image 178. Reproductive: Minimal prostatic enlargement Other: Question focal fluid collection in pelvis versus distended segment of fluid containing bowel 5.2 x 4.0 cm image 269. Small fluid collection at the LEFT gutter 3.0 x 3.0 cm image 199. No additional abscess collections. Scattered infiltrative changes of mesentery and omentum, which could be related  to surgery and or inflammatory process. Open ventral wound. No hernia. Musculoskeletal: No acute osseous findings. IMPRESSION: BILATERAL pulmonary nodules likely metastases. Bibasilar atelectasis and small to moderate LEFT pleural effusion. Persistent intrahepatic abscess collection measuring 4.6 x 4.3 x 4.0 cm immediately anterior to the pigtail of the drainage catheter. Additional fluid collections at the LEFT gutter and potentially in pelvis, could be sterile or infected postoperative collections. Single enlarged LEFT para-aortic lymph node. Electronically Signed   By: Lavonia Dana M.D.   On: 07/26/2018 11:37   Ct Abdomen Pelvis Wo Contrast  Result Date: 07/19/2018 CLINICAL DATA:  Abdominal pain.  Evaluate for abscess. EXAM: CT ABDOMEN AND PELVIS WITHOUT CONTRAST TECHNIQUE: Multidetector CT imaging of the abdomen and pelvis was performed following the standard protocol without IV contrast. COMPARISON:  None. FINDINGS: Lower chest: There is a small to moderate left pleural effusion with left lower lobe airspace consolidation. Subsegmental atelectasis noted within the right lower lobe. Posterior left upper lobe pulmonary nodule measures 9 mm, image 3/4. Hepatobiliary: Gas, fluid and debris filled structure along the dome of the liver measures 5.2 by 3.0 by 3.0 cm. I cannot tell if this is intraparenchymal, subcapsular or between the dome of liver and right hemidiaphragm. Within the limitations of unenhanced technique no additional focal liver abnormalities identified. The gallbladder appears normal. No biliary dilatation. Pancreas: Unremarkable. No pancreatic ductal dilatation or surrounding inflammatory changes. Spleen: Normal in size without focal abnormality. Adrenals/Urinary Tract: Normal adrenal glands. Unremarkable appearance of both kidneys. The urinary bladder appears normal. Stomach/Bowel: Nasogastric tube tip is in the body of stomach. The small bowel loops have a normal caliber. A right lower  quadrant colostomy is identified. Long segment Hartmann's pouch is identified which begins at the level of the distal descending colon, image 55/3. Vascular/Lymphatic: Mild aortic atherosclerosis. No aneurysm. Mild retroperitoneal adenopathy identified. No enlarged pelvic lymph nodes. 1 cm left retroperitoneal lymph node is identified, image 42/3. Adjacent lymph node measures 1.1 cm, image 44/3. Reproductive: Prostate is unremarkable. Other:  A surgical drainage catheter enters the left abdomen and terminates along the left pericolic gutter, image 27/2. There is a fluid collection within the left upper quadrant of the abdomen between the spleen and left hemidiaphragm which measures 12.4 cm in maximum dimension, image 75/6. The drainage catheter does not appear to access this fluid collection. a second possible fluid collection is identified within the pelvis between the anterior wall of rectum and posterior wall of bladder. This measures 5.6 x 3.1 by 5.5 cm. Scattered nodular densities are identified within the peritoneal cavity. The largest is in the left upper quadrant of the abdomen measuring 1.2 cm, image 50/6. Along the right lateral hemiabdomen there is a 7 mm peritoneal nodule, image 42/6. Musculoskeletal: Degenerative disc disease identified within the lower lumbar spine. IMPRESSION: 1. There are 3 fluid collections identified within the abdomen. The largest is in the left upper quadrant of the abdomen between the left hemidiaphragm and spleen. Within the pelvis there is a fluid collection between the anterior wall of rectum and posterior wall of bladder. Finally, along the dome of the right lobe of liver there is a gas, debris and fluid collection which may be within segment 7 of the liver or between the liver and right hemidiaphragm. These are all incompletely characterized without IV contrast but may represent multiple abscesses. 2. Status post right abdominal colostomy with long segment Hartmann's pouch.  No abnormal bowel dilatation identified. 3. Left lower lobe airspace consolidation and small pleural effusion. Cannot rule out pneumonia. 4. Mild retroperitoneal adenopathy. 5. Pulmonary nodule in the left lobe upper lobe measures 9 mm. In a patient that may be at increased risk for lung metastasis consider further evaluation with nonemergent contrast enhanced CT of the chest for staging purposes. 6. A few scattered peritoneal nodules are identified. Specificity somewhat diminished due to extensive postoperative changes within the abdomen. Clinical correlation for any surgical findings suggestive of peritoneal disease. Electronically Signed   By: Kerby Moors M.D.   On: 07/19/2018 16:50   Dg Chest 1 View  Result Date: 07/25/2018 CLINICAL DATA:  Tachypnea EXAM: CHEST  1 VIEW COMPARISON:  July 16, 2018 FINDINGS: Central catheter tip is in the superior vena cava. Previous right jugular catheter and nasogastric tube have been removed. No pneumothorax. There is hazy air base opacity in the left mid lower lung zones with small left pleural effusion. Right lung is clear. Heart is upper normal in size with pulmonary vascularity normal. No adenopathy. No bone lesions. There are drains in the upper abdominal regions. IMPRESSION: Hazy opacity in portions of the left mid lower lung zones, concerning for pneumonia, similar to prior study. Small left pleural effusion. Right lung clear. Stable cardiac silhouette. Central catheter tip in superior vena cava. No pneumothorax. Electronically Signed   By: Lowella Grip III M.D.   On: 07/25/2018 08:11   Dg Abd 1 View  Result Date: 07/15/2018 CLINICAL DATA:  Nasogastric tube placement. EXAM: ABDOMEN - 1 VIEW COMPARISON:  Chest radiograph 1 day prior FINDINGS: Nasogastric terminates at the body of the stomach. Probable small left pleural effusion with adjacent left lower lobe airspace disease. Gas-filled small bowel loops in the upper left abdomen. No gross free  intraperitoneal air. IMPRESSION: Nasogastric tube terminating at the body of the stomach. Electronically Signed   By: Abigail Miyamoto M.D.   On: 07/15/2018 08:18   Ct Head Wo Contrast  Result Date: 07/21/2018 CLINICAL DATA:  62 y/o M; altered encephalopathy. History of hypertension and diabetes. EXAM:  CT HEAD WITHOUT CONTRAST TECHNIQUE: Contiguous axial images were obtained from the base of the skull through the vertex without intravenous contrast. COMPARISON:  None. FINDINGS: Brain: No evidence of acute infarction, hemorrhage, hydrocephalus, extra-axial collection or mass lesion/mass effect. Nonspecific white matter hypodensities are compatible with mild chronic microvascular ischemic changes and there is mild volume loss of the brain for age. Vascular: Calcific atherosclerosis of carotid siphons. No hyperdense vessel identified. Skull: Normal. Negative for fracture or focal lesion. Sinuses/Orbits: Mild diffuse paranasal sinus mucosal thickening. Normal aeration of the mastoid air cells. Orbits are unremarkable. Other: None. IMPRESSION: 1. No acute intracranial abnormality identified. 2. Mild chronic microvascular ischemic changes and mild volume loss of the brain for age. Electronically Signed   By: Kristine Garbe M.D.   On: 07/21/2018 00:37   Ct Chest Wo Contrast  Result Date: 07/26/2018 CLINICAL DATA:  Sepsis, perforated cancer of the splenic flexure of the colon post hemicolectomy, postoperative abscesses EXAM: CT CHEST, ABDOMEN AND PELVIS WITHOUT CONTRAST TECHNIQUE: Multidetector CT imaging of the chest, abdomen and pelvis was performed following the standard protocol without IV contrast. Neither oral nor intravenous contrast were administered. Sagittal and coronal MPR images reconstructed from axial data set. COMPARISON:  CT abdomen and pelvis 07/19/2018 FINDINGS: CT CHEST FINDINGS Cardiovascular: Minimal atherosclerotic calcifications aorta and coronary arteries. Aorta normal caliber. No  pericardial effusion. Mediastinum/Nodes: Tip of endotracheal tube above carina. Tip of RIGHT arm PICC line at cavoatrial junction. Nasogastric tube traverses esophagus, esophagus otherwise unremarkable. No mediastinal adenopathy. Base of cervical region normal appearance. Lungs/Pleura: LEFT pleural effusion with compressive atelectasis of the posterior LEFT lung. Dependent atelectasis RIGHT lower lobe. RIGHT upper lobe pulmonary nodule 3 mm diameter image 49. RIGHT upper lobe pulmonary nodule 4 mm diameter image 29. Additional small nodule in RIGHT lower lobe adjacent to major fissure 5 mm diameter image 61. 6 mm LEFT upper lobe nodule image 48. No pneumothorax. Musculoskeletal: No acute osseous findings. CT ABDOMEN PELVIS FINDINGS Hepatobiliary: Beam hardening artifacts from arms traverse liver and spleen. Pigtail drainage catheter superiorly in liver. Persistent air and fluid containing abscess collection at anterior liver, anterior to the pigtail, 4.6 x 4.3 x 4.0 cm. No definite additional liver lesions Pancreas: Normal appearance Spleen: Predominately obscured by beam hardening artifacts. Adjacent pigtail drainage catheter posterior to spleen. Adrenals/Urinary Tract: Adrenal glands unremarkable. Kidneys grossly normal appearance. No definite hydronephrosis or hydroureter. Bladder decompressed by Foley catheter. Stomach/Bowel: Transverse colostomy RIGHT upper quadrant. Long Hartmann pouch to the level of the descending colon. Stomach decompressed. Small bowel loops unremarkable. Vascular/Lymphatic: Atherosclerotic calcifications aorta and iliac arteries. 12 mm LEFT para-aortic node image 178. Reproductive: Minimal prostatic enlargement Other: Question focal fluid collection in pelvis versus distended segment of fluid containing bowel 5.2 x 4.0 cm image 269. Small fluid collection at the LEFT gutter 3.0 x 3.0 cm image 199. No additional abscess collections. Scattered infiltrative changes of mesentery and omentum,  which could be related to surgery and or inflammatory process. Open ventral wound. No hernia. Musculoskeletal: No acute osseous findings. IMPRESSION: BILATERAL pulmonary nodules likely metastases. Bibasilar atelectasis and small to moderate LEFT pleural effusion. Persistent intrahepatic abscess collection measuring 4.6 x 4.3 x 4.0 cm immediately anterior to the pigtail of the drainage catheter. Additional fluid collections at the LEFT gutter and potentially in pelvis, could be sterile or infected postoperative collections. Single enlarged LEFT para-aortic lymph node. Electronically Signed   By: Lavonia Dana M.D.   On: 07/26/2018 11:37   Mr Brain Wo Contrast  Result  Date: 07/24/2018 CLINICAL DATA:  62 year old male with altered mental status. EXAM: MRI HEAD WITHOUT CONTRAST TECHNIQUE: Multiplanar, multiecho pulse sequences of the brain and surrounding structures were obtained without intravenous contrast. COMPARISON:  Head CT without contrast 07/21/2018 FINDINGS: Brain: No restricted diffusion to suggest acute infarction. No midline shift, mass effect, evidence of mass lesion, ventriculomegaly, extra-axial collection or acute intracranial hemorrhage. Cervicomedullary junction and pituitary are within normal limits. Questionable brainstem and cerebellar volume loss out of proportion to cerebral hemisphere volume loss. Scattered and patchy cerebral white matter T2 and FLAIR hyperintensity. Marginal involvement of the right lentiform nuclei. No cortical encephalomalacia or chronic cerebral blood products identified. The other deep gray matter nuclei appear within normal limits. No brainstem or cerebellar signal abnormality identified. Vascular: Major intracranial vascular flow voids are preserved. Skull and upper cervical spine: Negative visible cervical spine. Normal bone marrow signal. Sinuses/Orbits: Normal orbits soft tissues. Trace paranasal sinus mucosal. Other: Mastoids are clear. Grossly normal visible  internal auditory structures appear normal. Scalp and face soft tissues appear negative. IMPRESSION: 1.  No acute intracranial abnormality. 2. Questionable disproportionate volume loss of the brainstem and cerebellum, nonspecific. 3. Mild to moderate for age nonspecific signal changes in the cerebral white matter, most commonly due to chronic small vessel disease. Electronically Signed   By: Genevie Ann M.D.   On: 07/24/2018 12:57   Dg Chest Port 1 View  Result Date: 07/29/2018 CLINICAL DATA:  Tachypnea. EXAM: PORTABLE CHEST 1 VIEW COMPARISON:  07/28/2018 FINDINGS: Cardiac silhouette is normal in size. No mediastinal or hilar masses. Mild hazy left perihilar and lower lung zone opacity consistent with a combination of pleural fluid and dependent atelectasis. Minor linear atelectasis adjacent to minor fissure on the right. Lung findings stable from the previous day's exam. No new lung abnormalities.  No pneumothorax. Upper abdominal pigtail catheters are unchanged. Right PICC is stable. The endotracheal and nasal/orogastric tubes have been removed. IMPRESSION: 1. Status post removal of the endotracheal tube and nasal/orogastric tube. 2. No other significant change from the previous day's study. Electronically Signed   By: Lajean Manes M.D.   On: 07/29/2018 15:28   Dg Chest Port 1 View  Result Date: 07/28/2018 CLINICAL DATA:  62 year old male in the ICU with perforated splenic flexure tumor, peritonitis, sepsis, respiratory failure. EXAM: PORTABLE CHEST 1 VIEW COMPARISON:  CT chest abdomen and pelvis 07/26/2018. Portable chest 07/27/2018 and earlier. FINDINGS: Portable AP semi upright view at 0535 hours. Stable endotracheal tube tip at the level of the clavicles. Stable enteric tube, side hole at the level of the proximal stomach. Stable right PICC line. Stable bilateral upper quadrant percutaneous drainage catheters. Stable lung volumes and mediastinal contours. Persistent confluent left lung base opacity,  increased since 07/25/2018. No pneumothorax or pulmonary edema. No confluent opacity in the right lung. Paucity bowel gas in the upper abdomen. IMPRESSION: 1.  Stable lines and tubes. 2. Stable ventilation with dense left lung base opacity compatible with combined pleural effusion and consolidation. Electronically Signed   By: Genevie Ann M.D.   On: 07/28/2018 08:02   Dg Chest Port 1 View  Result Date: 07/27/2018 CLINICAL DATA:  Respiratory failure. EXAM: PORTABLE CHEST 1 VIEW COMPARISON:  07/26/2018 and chest CT 07/26/2018 FINDINGS: Right-sided PICC line with tip at the cavoatrial junction. Nasogastric tube with tip and side-port over the stomach in the left upper quadrant. Endotracheal tube has tip approximately 4.9 cm above the carina. Left basilar pleural pigtail catheter unchanged. Lungs are hypoinflated with hazy opacification  over the left lung and left base slightly worse likely layering effusion with associated basilar atelectasis. Infection in the left lung is possible. Cardiomediastinal silhouette and remainder of the exam is unchanged. IMPRESSION: Slight worsening hazy opacification over the left lung and lung base likely layering effusion with atelectasis, although infection is possible. Tubes and lines as described. Electronically Signed   By: Marin Olp M.D.   On: 07/27/2018 10:38   Dg Chest Port 1 View  Result Date: 07/26/2018 CLINICAL DATA:  Respiratory failure. EXAM: PORTABLE CHEST 1 VIEW COMPARISON:  Radiograph of July 25, 2018. FINDINGS: Stable cardiomediastinal silhouette. Endotracheal and nasogastric tubes are unchanged in position. Right-sided PICC line is unchanged. No pneumothorax is noted. Right lung is clear. Increased left basilar atelectasis or infiltrate is noted with associated pleural effusion. Bony thorax is unremarkable. IMPRESSION: Stable support apparatus. Increased left basilar atelectasis or infiltrate is noted with associated pleural effusion. Electronically Signed    By: Marijo Conception, M.D.   On: 07/26/2018 10:14   Dg Chest Port 1 View  Result Date: 07/25/2018 CLINICAL DATA:  CT and OG placement EXAM: PORTABLE CHEST 1 VIEW COMPARISON:  07/25/2018, 07/16/2018, 07/14/2018 FINDINGS: Endotracheal tube tip is about 2.5 cm superior to the carina. Right upper extremity catheter tip over the cavoatrial region. Esophageal tube tip in the left upper quadrant. Bilateral drainage catheters within the upper quadrants. Tiny left pleural effusion. Atelectasis versus minimal infiltrate at the medial left base. Stable cardiomediastinal silhouette. IMPRESSION: 1. Endotracheal tube tip about 2.5 cm superior to carina. Esophageal tube tip is in the left upper quadrant 2. Trace left pleural effusion with hazy atelectasis or infiltrate at the left base Electronically Signed   By: Donavan Foil M.D.   On: 07/25/2018 19:18   Dg Chest Port 1 View  Result Date: 07/16/2018 CLINICAL DATA:  Shortness of breath. History of pleural effusion, peritoneal carcinomatosis EXAM: PORTABLE CHEST 1 VIEW COMPARISON:  Portable chest x-ray of July 14 2018 FINDINGS: The lungs are mildly hypoinflated. There is persistent increased density in the left mid and lower lung. Small left pleural effusion. The right lung is clear. The heart is normal in size. The hilar structures are prominent greatest on the left. The esophagogastric tube tip projects in the gastric cardia with the proximal port at the level of the GE junction. The right internal jugular venous catheter tip projects over the junction of the proximal and midportions of the SVC. The bony structures are unremarkable. IMPRESSION: Interval extubation of the trachea. Persistent bilateral hypoinflation. Left basilar atelectasis or pneumonia with small left pleural effusion, stable. The esophagogastric tube merits advancement by between 5 and 10 cm to assure that the proximal port is positioned below the GE junction. Electronically Signed   By: David   Martinique M.D.   On: 07/16/2018 07:32   Dg Chest Port 1 View  Result Date: 07/14/2018 CLINICAL DATA:  Respiratory failure. EXAM: PORTABLE CHEST 1 VIEW COMPARISON:  One-view chest x-ray 07/12/2018 FINDINGS: The heart size is normal. Endotracheal tube is stable position. The side port of the NG tube is in the distal esophagus. A right IJ line is stable. A left pleural effusion and basilar airspace disease is increasing. The right lung is clear. Lung volumes are low. IMPRESSION: 1. The side port of the NG tube is in the esophagus and could be advanced for more optimal positioning. 2. Support apparatus is otherwise stable. Satisfactory positioning of endotracheal tube. 3. Low lung volumes with progressive left pleural effusion and airspace  disease, likely atelectasis. Infection is not excluded. Electronically Signed   By: San Morelle M.D.   On: 07/14/2018 08:26   Dg Chest Port 1 View  Result Date: 07/12/2018 CLINICAL DATA:  Endotracheal tube, recent exploratory laparotomy. EXAM: PORTABLE CHEST 1 VIEW COMPARISON:  07/28/2018. FINDINGS: Endotracheal tube terminates 4.9 cm above the carina. Nasogastric tube is followed into the stomach. Right IJ central line tip projects at the junction of the brachiocephalic veins. Heart size stable. Lungs are low in volume with minimal left basilar atelectasis. No airspace consolidation or pleural fluid. No pneumothorax. IMPRESSION: Low lung volumes with minimal left basilar atelectasis. Electronically Signed   By: Lorin Picket M.D.   On: 07/12/2018 10:48   Dg Chest Port 1 View  Result Date: 07/16/2018 CLINICAL DATA:  Intubated patient. Bowel obstruction, peritoneal carcinomatosis. EXAM: PORTABLE CHEST 1 VIEW COMPARISON:  PA and lateral chest x-ray of July 10, 2018 FINDINGS: The lungs are reasonably well inflated and clear. A left-sided nodular density seen on yesterday's study is not evident today. The heart and pulmonary vascularity are normal. The mediastinum  is normal in width. The endotracheal tube tip lies approximately 4 cm above the carina. The esophagogastric tube tip projects in the gastric cardia with the proximal port at or just above the GE junction. The right internal jugular venous catheter tip projects at the junction of the proximal and midportions of the SVC. IMPRESSION: Intubated patient. No acute cardiopulmonary abnormality. No free subdiaphragmatic gas collections are observed. Advancement of the nasogastric tube by 5-10 cm is needed to assure that the proximal port lies below the GE junction. Electronically Signed   By: David  Martinique M.D.   On: 07/14/2018 14:16   Dg Abd Portable 1v  Result Date: 07/26/2018 CLINICAL DATA:  OG tube placement EXAM: PORTABLE ABDOMEN - 1 VIEW COMPARISON:  CT 07/26/2018 FINDINGS: NG tube tip is in the proximal stomach with the side port near the GE junction. Left abdominal drainage catheter and left pleural pigtail catheter noted. Nonobstructive bowel gas pattern. IMPRESSION: NG tube tip in the proximal stomach. Electronically Signed   By: Rolm Baptise M.D.   On: 07/26/2018 22:01   Dg Abd Portable 1v  Addendum Date: 07/20/2018   ADDENDUM REPORT: 07/20/2018 07:34 ADDENDUM: Study discussed by telephone with Nurse Jolayne Haines on 07/20/2018 at 0731 hours. Electronically Signed   By: Genevie Ann M.D.   On: 07/20/2018 07:34   Result Date: 07/20/2018 CLINICAL DATA:  62 year old male enteric tube placed. EXAM: PORTABLE ABDOMEN - 1 VIEW COMPARISON:  CT Abdomen and Pelvis 07/19/2018. FINDINGS: Semi upright AP views of the chest and Portable AP supine view of the abdomen at 0646 hours. Enteric tube courses into the airway and into the left mainstem bronchus terminating at the lower aspect of the left hilum on both views. Right PICC line in place. Left abdominal drainage catheter remains in place. Streaky opacity at the left base seen to be a mix of pleural fluid and airspace disease appears stable since yesterday. The right  lung is clear allowing for portable technique. Stable bowel gas pattern, nonobstructed. No acute osseous abnormality identified. IMPRESSION: 1. Malpositioned enteric tube is in the airway, left lower lobe bronchus. 2. Patchy left lung base opacity is stable since the CT yesterday due to combined pleural fluid and airspace disease. 3. Eight abdominal drainage catheter and stable bowel gas pattern. Electronically Signed: By: Genevie Ann M.D. On: 07/20/2018 07:23   Ct Image Guided Drainage By Percutaneous Catheter  Result  Date: 07/30/2018 INDICATION: Multiple abscesses EXAM: CT GUIDED DRAINAGE OF LIVER AND ABDOMINAL ABSCESSES MEDICATIONS: The patient is currently admitted to the hospital and receiving intravenous antibiotics. The antibiotics were administered within an appropriate time frame prior to the initiation of the procedure. ANESTHESIA/SEDATION: Four mg IV Versed 100 mcg IV Fentanyl Moderate Sedation Time:  31 minutes The patient was continuously monitored during the procedure by the interventional radiology nurse under my direct supervision. COMPLICATIONS: None immediate. TECHNIQUE: Informed written consent was obtained from the patient after a thorough discussion of the procedural risks, benefits and alternatives. All questions were addressed. Maximal Sterile Barrier Technique was utilized including caps, mask, sterile gowns, sterile gloves, sterile drape, hand hygiene and skin antiseptic. A timeout was performed prior to the initiation of the procedure. PROCEDURE: The abdomen was prepped with ChloraPrep in a sterile fashion, and a sterile drape was applied covering the operative field. A sterile gown and sterile gloves were used for the procedure. Local anesthesia was provided with 1% Lidocaine. The left flank was prepped and draped in a sterile fashion. Under CT guidance, an 18 gauge needle was advanced into the perisplenic abscess. It was removed over an Amplatz wire. Ten [place] dilator followed by a 10  [place] drain were inserted. It was looped and string fixed then sewn to the skin. The upper abdomen was prepped and draped in a sterile fashion. 1% lidocaine was utilized for local anesthesia. Under CT guidance, an 18 gauge needle was advanced into the hepatic abscess at the anterior dome. The needle was positioned within the abscess cavity. It was removed over an Amplatz wire. Ten [place] dilator followed by a 10 [place] drain were inserted. The loop is coiled partially in the abscess and partially in the adjacent hepatic parenchyma. The drain was looped and string fixed then sewn to the skin. FINDINGS: Imaging confirms placement of perisplenic and hepatic abscess drains. IMPRESSION: Successful hepatic abscess and perisplenic abscess drains. Electronically Signed   By: Marybelle Killings M.D.   On: 07/30/2018 07:51   Ct Image Guided Drainage By Percutaneous Catheter  Result Date: 07/30/2018 INDICATION: Multiple abscesses EXAM: CT GUIDED DRAINAGE OF LIVER AND ABDOMINAL ABSCESSES MEDICATIONS: The patient is currently admitted to the hospital and receiving intravenous antibiotics. The antibiotics were administered within an appropriate time frame prior to the initiation of the procedure. ANESTHESIA/SEDATION: Four mg IV Versed 100 mcg IV Fentanyl Moderate Sedation Time:  31 minutes The patient was continuously monitored during the procedure by the interventional radiology nurse under my direct supervision. COMPLICATIONS: None immediate. TECHNIQUE: Informed written consent was obtained from the patient after a thorough discussion of the procedural risks, benefits and alternatives. All questions were addressed. Maximal Sterile Barrier Technique was utilized including caps, mask, sterile gowns, sterile gloves, sterile drape, hand hygiene and skin antiseptic. A timeout was performed prior to the initiation of the procedure. PROCEDURE: The abdomen was prepped with ChloraPrep in a sterile fashion, and a sterile drape was  applied covering the operative field. A sterile gown and sterile gloves were used for the procedure. Local anesthesia was provided with 1% Lidocaine. The left flank was prepped and draped in a sterile fashion. Under CT guidance, an 18 gauge needle was advanced into the perisplenic abscess. It was removed over an Amplatz wire. Ten [place] dilator followed by a 10 [place] drain were inserted. It was looped and string fixed then sewn to the skin. The upper abdomen was prepped and draped in a sterile fashion. 1% lidocaine was utilized for local  anesthesia. Under CT guidance, an 18 gauge needle was advanced into the hepatic abscess at the anterior dome. The needle was positioned within the abscess cavity. It was removed over an Amplatz wire. Ten [place] dilator followed by a 10 [place] drain were inserted. The loop is coiled partially in the abscess and partially in the adjacent hepatic parenchyma. The drain was looped and string fixed then sewn to the skin. FINDINGS: Imaging confirms placement of perisplenic and hepatic abscess drains. IMPRESSION: Successful hepatic abscess and perisplenic abscess drains. Electronically Signed   By: Marybelle Killings M.D.   On: 07/30/2018 07:51   [place] Ekg Site Rite  Result Date: 07/16/2018 If Site Rite image not attached, placement could not be confirmed due to current cardiac rhythm.   Microbiology: No results found for this or any previous visit (from the past 240 hour(s)).   Labs: Basic Metabolic Panel: Recent Labs  Lab 08/03/18 0421 08/04/18 0504  NA 154* 157*  K 3.8 3.8  CL 128* >130*  CO2 16* 17*  GLUCOSE 127* 172*  BUN 104* 95*  CREATININE 3.58* 3.64*  CALCIUM 9.4 9.8  PHOS 5.7* 5.7*   Liver Function Tests: Recent Labs  Lab 08/03/18 0421 08/04/18 0504  ALBUMIN 2.0* 2.3*   No results for input(s): LIPASE, AMYLASE in the last 168 hours. No results for input(s): AMMONIA in the last 168 hours. CBC: Recent Labs  Lab 08/03/18 0421 08/04/18 0504  WBC 13.0*  16.4*  NEUTROABS 10.1* 13.2*  HGB 9.7* 11.1*  HCT 32.7* 36.2*  MCV 92.9 92.8  PLT 604* 598*   Cardiac Enzymes: No results for input(s): CKTOTAL, CKMB, CKMBINDEX, TROPONINI in the last 168 hours. D-Dimer No results for input(s): DDIMER in the last 72 hours. BNP: Invalid input(s): POCBNP CBG: Recent Labs  Lab 08/06/18 1249 08/06/18 1819 08/07/18 0015 08/07/18 0556 08/07/18 1203  GLUCAP 151* 137* 165* 157* 147*   Anemia work up No results for input(s): VITAMINB12, FOLATE, FERRITIN, TIBC, IRON, RETICCTPCT in the last 72 hours. Urinalysis    Component Value Date/Time   COLORURINE YELLOW 07/26/2018 1547   APPEARANCEUR CLOUDY (A) 07/26/2018 1547   LABSPEC 1.017 07/26/2018 1547   PHURINE 5.0 07/26/2018 1547   GLUCOSEU NEGATIVE 07/26/2018 1547   HGBUR LARGE (A) 07/26/2018 1547   BILIRUBINUR NEGATIVE 07/26/2018 1547   KETONESUR NEGATIVE 07/26/2018 1547   PROTEINUR 30 (A) 07/26/2018 1547   NITRITE NEGATIVE 07/26/2018 1547   LEUKOCYTESUR TRACE (A) 07/26/2018 1547   Sepsis Labs Invalid input(s): PROCALCITONIN,  WBC,  LACTICIDVEN    SIGNED:  Marylu Lund, MD  [facility] 08/09/2018, 6:09 PM  If 7PM-7AM, please contact night-coverage [email or web address] Password [email or web address]

## 2018-09-02 NOTE — Plan of Care (Signed)
  Problem: Clinical Measurements: Goal: Ability to maintain clinical measurements within normal limits will improve Outcome: Not Met (add Reason)

## 2018-09-02 NOTE — Progress Notes (Addendum)
PMT note:  Patient is resting in bed, no family at bedside. He appears comfortable. He is no longer responsive to voice or touch. His eyes are partially open. His respirations are shallow. Toes are cool to the touch. Anticipated hospital death. No changes in medication regimen today.    15 minute visit

## 2018-09-02 NOTE — Progress Notes (Signed)
PROGRESS NOTE  Jerry Green  QMG:[id number] DOB: 30-Aug-1956 DOA: 07/29/2018 PCP: Ollen Bowl, MD   Brief Narrative: Jerry Green is a 62 y.o. male with a history of T2DM, HTN, and GERD who presented initially to [facility] in [place] with progressive RLQ abdominal pain, weight loss found to have a perforated obstructing splenic flexure mass with evidence of lymphatic and liver metastases and peritoneal carcinomatosis. Due to no GI availability, he was transferred to [facility], arriving 10/8 and taken for exploratory laparotomy, hemicolectomy and liver biopsy 10/9, open abdominal wound with vac. He was kept in the ICU, intubated on pressors with evidence of AKI. TNA started 10/15. Pathology returned with adenocarcinoma of the colon and liver biopsy also positive. Oncology was consulted and recommended initiating chemotherapy in the outpatient setting pending clinical course. Unfortunately, the postoperative course was complicated by encephalopathy, respiratory failure, peritonitis with multifocal abscesses for which drains were placed 10/18, and acute renal failure. Nephrology was consulted but did not recommend initiation of dialysis due to stage IV colon cancer. Supportive measures were continued including prolonged antibiotics/antifungal per ID, ventilatory support/reintubation 10/24 due to decreased mental status, bicarbonate . CT of the chest also demonstrated pulmonary nodules suspected metastases. Palliative care was consulted on 10/24, discussed grim prognosis with his HCPOA and only child, Jerry Green, who requested the patient be DNR but continue aggressive interventions. He required transfusions for anemia and developed fevers but ultimately stabilized enough to be weaned from the vent 10/27 and extubated to BiPAP and ultimately to supplemental oxygen alone. Encephalopathy has not improved and work up for reversible causes has been negative to date. The daughter had requested  comfort measures. He is stable for transport to a hospice facility.  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  - Continue supplemental oxygen prn dyspnea. Does not seem to require at this time. - Continue dilaudid gtt as tolerated and needed.  Acute metabolic encephalopathy: Likely also complicated by acute delirium due to medical conditions, though is certainly multifactorial, namely due to sepsis, possibly uremia, and hypernatremia. MRI without acute findings 10/22 nonacute, volume loss in brainstem and cerebellum as well as mild-moderate white matter changes consistent with small vessel disease. B12 405. TSH 2.395. - No further interventions.   Sepsis due to peritonitis, intraabdominal abscesses due to perforated colon: s/p left hemicolectomy. Culture data unhelpful. Has not had resolution of leukocytosis. - Stopped antibiotics per [place] discussion 11/2.  - Wound dehiscence noted, no evisceration, care per general surgery. Aim to minimize discomfort. - Pulled drains due to comfort measures. - Stopped TPN, not taking any po.  Acute renal failure:  - No further lab draws,  use judicious medications as  able and avoid nephrotoxins.    Hypernatremia: Free water deficit.  - No IVF desired by HCPOA.  Stage IV adenocarcinoma: CEA 94.4. - Oncology was following, currently not a candidate for therapy. Very advanced disease makes prognosis poor. Not planning Tx. - Goal of therapy is comfort as this is an incurable diagnosis.   T2DM: HbA1c 7.4%, hyperglycemic here due to stress, infection, steroids.  - DC CBGs, SSI  Moderate protein-calorie malnutrition:  - Diet as tolerated, no further interventions.  Acute blood loss anemia on anemia of acute and chronic disease: Hgb stable, required transfusions 10/13 and 10/25. No bleeding. No more lab draws.  DVT prophylaxis: Comfort care Code Status: DNR Family Communication: Pt in room, family not at bedside Disposition Plan: Anticipate inpatient death.   Consultants:   PCCM  IR  General surgery  Palliative care  Procedures:  07/31/2018: Dr. Donne Hazel.  1.  Exploratory laparotomy 2.  Left colectomy 3.  Biopsy of liver lesion  10/9 ETT >> 10/9 NGT >> 10/9 Foley >> 10/9 R DL IJ CVC >> 10/14 PICC placed 10/15 TNA initiated  Antimicrobials: Zosyn 10/8 - 11/2 Linezolid 10/25 - 11/2 Anidulafungin 10/20 - 11/2  Subjective: Appears comfortable  Objective: BP (!) 82/24 (BP Location: Left Leg)   Pulse (!) 104   Temp 98.5 F (36.9 C) (Oral)   Resp (!) 30   Ht 6\' 2"  (1.88 m)   Wt (!) 153.8 kg   SpO2 (!) 85%   BMI 43.53 kg/m   General exam: Awake, laying in bed, in nad Respiratory system: Normal respiratory effort   Marylu Lund, MD [facility] [email or web address] Password [email or web address] Aug 13, 2018, 2:31 PM

## 2018-09-02 NOTE — Progress Notes (Signed)
Patient expired approximately 2309. No family at bedside. MD notified. Patients daughter contacted. Stated that "she doesn't know any information and doesn't think family would leave a 62 year old planning a funeral. Recommended to call her uncle (pts brother Jerry Green) and speak to him". Called patients brother Jerry Green and spoke briefly about conversation held with patients daughter. Mentioned to Jerry Green that he should call niece so they could come up with best solution for patient. Patients brother Jerry Green said he would call hospital once he has information.

## 2018-09-02 DEATH — deceased

## 2019-05-15 IMAGING — DX DG CHEST 1V PORT
1 series · 1 of 1 positions shown · non-contrast
Comparison: Radiograph July 25, 2018.

CLINICAL DATA: Respiratory failure.

EXAM:
PORTABLE CHEST 1 VIEW

[chest]
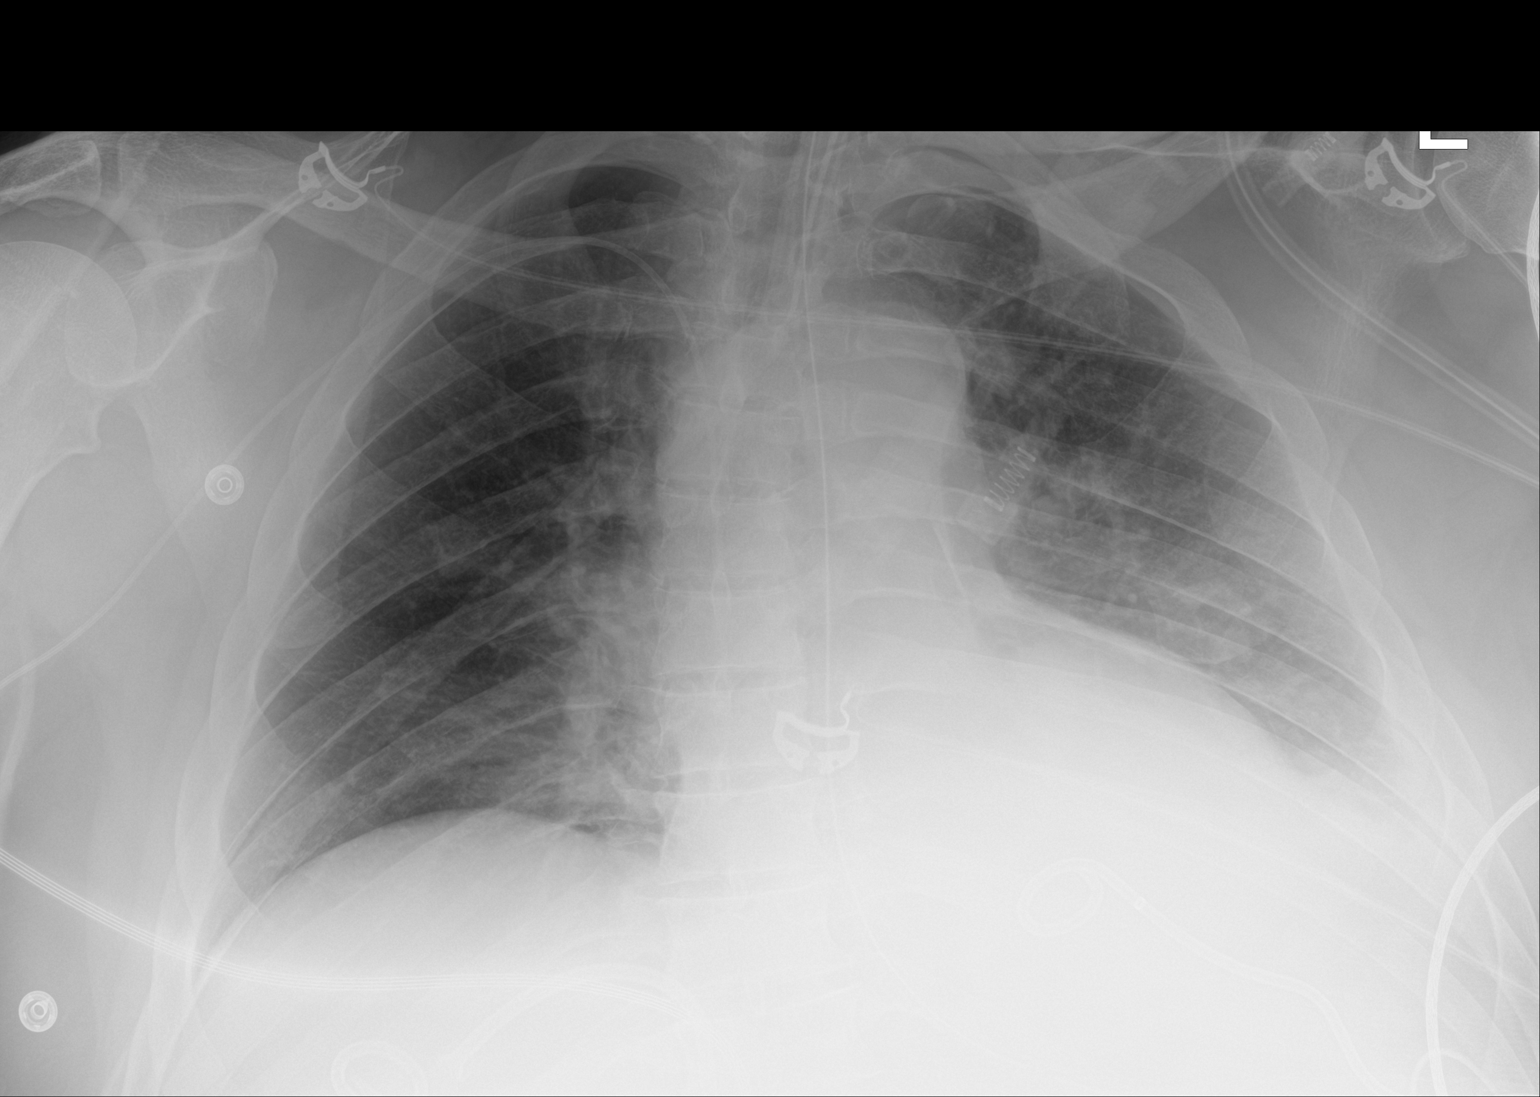

[1 of 1 positions shown; findings below may reference images not displayed]

FINDINGS: Stable cardiomediastinal silhouette. Endotracheal and nasogastric
tubes are unchanged in position. Right-sided PICC line is unchanged.
No pneumothorax is noted. Right lung is clear. Increased left
basilar atelectasis or infiltrate is noted with associated pleural
effusion. Bony thorax is unremarkable.
IMPRESSION: Stable support apparatus. Increased left basilar atelectasis or
infiltrate is noted with associated pleural effusion.

## 2019-05-16 IMAGING — DX DG CHEST 1V PORT
1 series · 1 of 1 positions shown · non-contrast
Comparison: 07/26/2018 and chest CT 07/26/2018

CLINICAL DATA: Respiratory failure.

EXAM:
PORTABLE CHEST 1 VIEW

[chest]
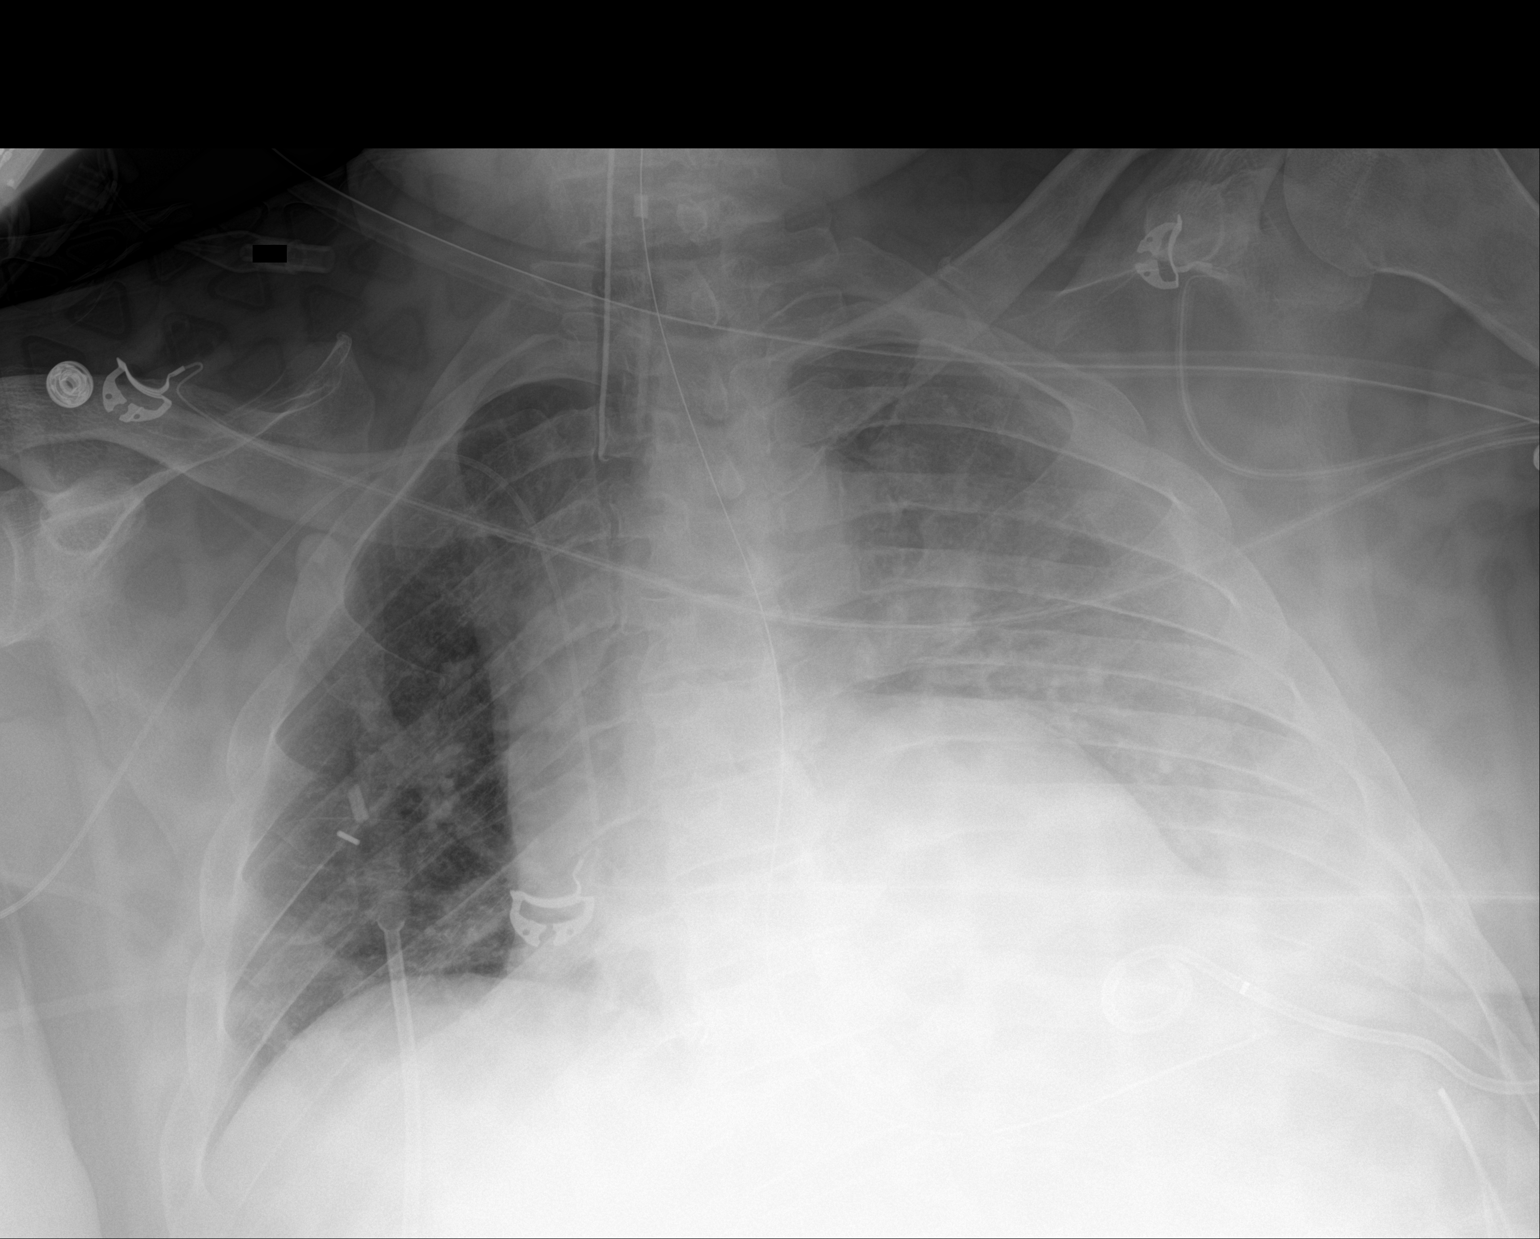

[1 of 1 positions shown; findings below may reference images not displayed]

FINDINGS: Right-sided PICC line with tip at the cavoatrial junction.
Nasogastric tube with tip and side-port over the stomach in the left
upper quadrant. Endotracheal tube has tip approximately 4.9 cm above
the carina. Left basilar pleural pigtail catheter unchanged. Lungs
are hypoinflated with hazy opacification over the left lung and left
base slightly worse likely layering effusion with associated basilar
atelectasis. Infection in the left lung is possible.
Cardiomediastinal silhouette and remainder of the exam is unchanged.
IMPRESSION: Slight worsening hazy opacification over the left lung and lung base
likely layering effusion with atelectasis, although infection is
possible.

Tubes and lines as described.
